# Patient Record
Sex: Female | Born: 1935 | Race: White | Hispanic: No | Smoking: Never smoker
Health system: Southern US, Community
[De-identification: ages and names within clinical notes are randomized; demographics above are authoritative.]

## PROBLEM LIST (undated history)

## (undated) DIAGNOSIS — C801 Malignant (primary) neoplasm, unspecified: Secondary | ICD-10-CM

## (undated) DIAGNOSIS — N186 End stage renal disease: Secondary | ICD-10-CM

## (undated) DIAGNOSIS — N19 Unspecified kidney failure: Secondary | ICD-10-CM

## (undated) DIAGNOSIS — I4891 Unspecified atrial fibrillation: Secondary | ICD-10-CM

## (undated) DIAGNOSIS — M1990 Unspecified osteoarthritis, unspecified site: Secondary | ICD-10-CM

## (undated) DIAGNOSIS — E785 Hyperlipidemia, unspecified: Secondary | ICD-10-CM

## (undated) DIAGNOSIS — I10 Essential (primary) hypertension: Secondary | ICD-10-CM

## (undated) DIAGNOSIS — I509 Heart failure, unspecified: Secondary | ICD-10-CM

## (undated) DIAGNOSIS — I341 Nonrheumatic mitral (valve) prolapse: Secondary | ICD-10-CM

## (undated) HISTORY — DX: Malignant (primary) neoplasm, unspecified: C80.1

## (undated) HISTORY — DX: Unspecified osteoarthritis, unspecified site: M19.90

## (undated) HISTORY — DX: Heart failure, unspecified: I50.9

## (undated) HISTORY — PX: ABDOMINAL HYSTERECTOMY: SHX81

## (undated) HISTORY — PX: NEPHRECTOMY: SHX65

## (undated) HISTORY — DX: Unspecified atrial fibrillation: I48.91

## (undated) HISTORY — PX: TONSILLECTOMY: SUR1361

## (undated) HISTORY — DX: Hyperlipidemia, unspecified: E78.5

## (undated) HISTORY — PX: APPENDECTOMY: SHX54

---

## 2008-02-16 HISTORY — PX: AV FISTULA PLACEMENT: SHX1204

## 2008-06-05 ENCOUNTER — Ambulatory Visit: Payer: Self-pay | Admitting: Cardiology

## 2008-07-17 ENCOUNTER — Ambulatory Visit: Payer: Self-pay | Admitting: Vascular Surgery

## 2008-07-25 ENCOUNTER — Ambulatory Visit (HOSPITAL_COMMUNITY): Admission: RE | Admit: 2008-07-25 | Discharge: 2008-07-25 | Payer: Self-pay | Admitting: Vascular Surgery

## 2008-07-29 ENCOUNTER — Ambulatory Visit (HOSPITAL_COMMUNITY): Admission: RE | Admit: 2008-07-29 | Discharge: 2008-07-29 | Payer: Self-pay | Admitting: Vascular Surgery

## 2008-07-29 ENCOUNTER — Ambulatory Visit: Payer: Self-pay | Admitting: Vascular Surgery

## 2008-08-28 ENCOUNTER — Ambulatory Visit: Payer: Self-pay | Admitting: Vascular Surgery

## 2009-02-15 HISTORY — PX: PERITONEAL CATHETER INSERTION: SHX2223

## 2009-03-19 ENCOUNTER — Ambulatory Visit: Payer: Self-pay | Admitting: Vascular Surgery

## 2009-08-01 ENCOUNTER — Ambulatory Visit: Payer: Self-pay | Admitting: Vascular Surgery

## 2009-08-19 ENCOUNTER — Ambulatory Visit (HOSPITAL_COMMUNITY): Admission: RE | Admit: 2009-08-19 | Discharge: 2009-08-19 | Payer: Self-pay | Admitting: Vascular Surgery

## 2009-08-21 ENCOUNTER — Ambulatory Visit: Payer: Self-pay | Admitting: Vascular Surgery

## 2009-08-21 ENCOUNTER — Ambulatory Visit (HOSPITAL_COMMUNITY): Admission: RE | Admit: 2009-08-21 | Discharge: 2009-08-21 | Payer: Self-pay | Admitting: Vascular Surgery

## 2009-09-02 ENCOUNTER — Ambulatory Visit (HOSPITAL_COMMUNITY): Admission: RE | Admit: 2009-09-02 | Discharge: 2009-09-02 | Payer: Self-pay | Admitting: Surgery

## 2009-09-24 ENCOUNTER — Ambulatory Visit: Payer: Self-pay | Admitting: Vascular Surgery

## 2009-10-11 ENCOUNTER — Inpatient Hospital Stay (HOSPITAL_COMMUNITY): Admission: EM | Admit: 2009-10-11 | Discharge: 2009-10-13 | Payer: Self-pay | Admitting: Internal Medicine

## 2009-11-06 ENCOUNTER — Ambulatory Visit: Payer: Self-pay | Admitting: Vascular Surgery

## 2010-04-30 ENCOUNTER — Ambulatory Visit (INDEPENDENT_AMBULATORY_CARE_PROVIDER_SITE_OTHER): Payer: Medicare Other | Admitting: Vascular Surgery

## 2010-04-30 DIAGNOSIS — N186 End stage renal disease: Secondary | ICD-10-CM

## 2010-05-01 LAB — BASIC METABOLIC PANEL
BUN: 40 mg/dL — ABNORMAL HIGH (ref 6–23)
CO2: 25 mEq/L (ref 19–32)
Calcium: 8.6 mg/dL (ref 8.4–10.5)
Chloride: 114 mEq/L — ABNORMAL HIGH (ref 96–112)
Creatinine, Ser: 3.74 mg/dL — ABNORMAL HIGH (ref 0.4–1.2)
GFR calc Af Amer: 14 mL/min — ABNORMAL LOW (ref >60)
GFR calc non Af Amer: 12 mL/min — ABNORMAL LOW (ref >60)
Glucose, Bld: 134 mg/dL — ABNORMAL HIGH (ref 70–99)
Potassium: 4 mEq/L (ref 3.5–5.1)
Sodium: 145 mEq/L (ref 135–145)

## 2010-05-01 LAB — PROTIME-INR
INR: 2.72 — ABNORMAL HIGH (ref 0.00–1.49)
INR: 2.85 — ABNORMAL HIGH (ref 0.00–1.49)
INR: 2.95 — ABNORMAL HIGH (ref 0.00–1.49)
Prothrombin Time: 28.9 seconds — ABNORMAL HIGH (ref 11.6–15.2)
Prothrombin Time: 30.8 seconds — ABNORMAL HIGH (ref 11.6–15.2)

## 2010-05-01 LAB — CBC
HCT: 29.5 % — ABNORMAL LOW (ref 36.0–46.0)
Hemoglobin: 9.7 g/dL — ABNORMAL LOW (ref 12.0–15.0)
MCHC: 32.9 g/dL (ref 30.0–36.0)
MCV: 86.3 fL (ref 78.0–100.0)
Platelets: 162 10*3/uL (ref 150–400)
RDW: 15.6 % — ABNORMAL HIGH (ref 11.5–15.5)
WBC: 7.9 10*3/uL (ref 4.0–10.5)

## 2010-05-01 LAB — RENAL FUNCTION PANEL
Albumin: 2.8 g/dL — ABNORMAL LOW (ref 3.5–5.2)
BUN: 29 mg/dL — ABNORMAL HIGH (ref 6–23)
CO2: 22 mEq/L (ref 19–32)
Calcium: 8.2 mg/dL — ABNORMAL LOW (ref 8.4–10.5)
Chloride: 114 mEq/L — ABNORMAL HIGH (ref 96–112)
Creatinine, Ser: 3.43 mg/dL — ABNORMAL HIGH (ref 0.4–1.2)
GFR calc Af Amer: 16 mL/min — ABNORMAL LOW (ref >60)
GFR calc non Af Amer: 13 mL/min — ABNORMAL LOW (ref >60)
Glucose, Bld: 147 mg/dL — ABNORMAL HIGH (ref 70–99)
Phosphorus: 2.5 mg/dL (ref 2.3–4.6)
Potassium: 3.7 mEq/L (ref 3.5–5.1)
Sodium: 144 mEq/L (ref 135–145)

## 2010-05-01 LAB — CARDIAC PANEL(CRET KIN+CKTOT+MB+TROPI)
CK, MB: 1 ng/mL (ref 0.3–4.0)
CK, MB: 1.4 ng/mL (ref 0.3–4.0)
Relative Index: INVALID (ref 0.0–2.5)
Total CK: 35 U/L (ref 7–177)
Troponin I: 0.03 ng/mL (ref 0.00–0.06)
Troponin I: 0.03 ng/mL (ref 0.00–0.06)

## 2010-05-01 LAB — APTT: aPTT: 43 seconds — ABNORMAL HIGH (ref 24–37)

## 2010-05-01 LAB — GLUCOSE, CAPILLARY: Glucose-Capillary: 133 mg/dL — ABNORMAL HIGH (ref 70–99)

## 2010-05-01 LAB — MAGNESIUM: Magnesium: 2.2 mg/dL (ref 1.5–2.5)

## 2010-05-01 LAB — PHOSPHORUS: Phosphorus: 3.1 mg/dL (ref 2.3–4.6)

## 2010-05-01 NOTE — Assessment & Plan Note (Signed)
OFFICE VISIT  Valerie Yoder, Valerie Yoder DOB:  May 31, 1930                                       04/30/2010 CHART#:[id number]  The patient returns today for followup appointment.  She previously had a left brachiocephalic fistula placed in June of 2010.  She subsequently had a patch angioplasty of the fistula performed in July of 2011 for a narrowing in the fistula.  She currently is on peritoneal dialysis.  She returns today for further followup and evaluation of the fistula.  CHRONIC MEDICAL PROBLEMS:  Continue to remain diabetes, hypertension, congestive heart failure.  REVIEW OF SYSTEMS:  She denies any shortness of breath or chest pain and states that her peritoneal dialysis has been going well.  PHYSICAL EXAM:  Blood pressure is 151/66 in the left arm, heart rate 66 and regular.  Temperature is 98.1.  Left upper extremity:  She has an easily palpable thrill in the fistula.  The proximal aspect of the fistula has dilated well and is approximately 6 mm in diameter.  It is between 4 and 6 mm in diameter on palpation in the upper arm.  Since the patient currently is not on dialysis I would prefer to continue to observe this for a while longer and see if it will mature on its own history.  She will continue to exercise the fistula.  She will follow up in 6 weeks' time for a duplex ultrasound of the fistula.  If it has not matured at that point then we will consider whether other interventions might be necessary.    Janetta Hora. Tajana Crotteau, MD Electronically Signed  CEF/MEDQ  D:  04/30/2010  T:  05/01/2010  Job:  [id number]  cc:   Duke Salvia. Eliott Nine, M.D.

## 2010-05-02 LAB — URINALYSIS, ROUTINE W REFLEX MICROSCOPIC
Glucose, UA: NEGATIVE mg/dL
Nitrite: NEGATIVE
pH: 6 (ref 5.0–8.0)

## 2010-05-02 LAB — URINE MICROSCOPIC-ADD ON

## 2010-05-02 LAB — POCT I-STAT 4, (NA,K, GLUC, HGB,HCT): Glucose, Bld: 117 mg/dL — ABNORMAL HIGH (ref 70–99)

## 2010-05-02 LAB — PROTIME-INR: Prothrombin Time: 16.2 seconds — ABNORMAL HIGH (ref 11.6–15.2)

## 2010-05-02 LAB — GLUCOSE, CAPILLARY: Glucose-Capillary: 117 mg/dL — ABNORMAL HIGH (ref 70–99)

## 2010-05-03 LAB — CBC
Hemoglobin: 11.2 g/dL — ABNORMAL LOW (ref 12.0–15.0)
Platelets: 181 10*3/uL (ref 150–400)
RBC: 3.73 MIL/uL — ABNORMAL LOW (ref 3.87–5.11)
WBC: 7.9 10*3/uL (ref 4.0–10.5)

## 2010-05-03 LAB — SURGICAL PCR SCREEN: Staphylococcus aureus: NEGATIVE

## 2010-05-03 LAB — PROTIME-INR
INR: 1.66 — ABNORMAL HIGH (ref 0.00–1.49)
Prothrombin Time: 19.5 seconds — ABNORMAL HIGH (ref 11.6–15.2)
Prothrombin Time: 22.3 seconds — ABNORMAL HIGH (ref 11.6–15.2)

## 2010-05-03 LAB — POCT I-STAT 4, (NA,K, GLUC, HGB,HCT)
Hemoglobin: 10.9 g/dL — ABNORMAL LOW (ref 12.0–15.0)
Hemoglobin: 12.2 g/dL (ref 12.0–15.0)
Potassium: 3.8 mEq/L (ref 3.5–5.1)
Potassium: 4.2 mEq/L (ref 3.5–5.1)
Sodium: 139 mEq/L (ref 135–145)

## 2010-05-03 LAB — BASIC METABOLIC PANEL
Calcium: 8.6 mg/dL (ref 8.4–10.5)
Chloride: 103 mEq/L (ref 96–112)
Creatinine, Ser: 5.02 mg/dL — ABNORMAL HIGH (ref 0.4–1.2)
GFR calc Af Amer: 10 mL/min — ABNORMAL LOW (ref >60)
Sodium: 137 mEq/L (ref 135–145)

## 2010-05-03 LAB — GLUCOSE, CAPILLARY: Glucose-Capillary: 116 mg/dL — ABNORMAL HIGH (ref 70–99)

## 2010-05-25 LAB — PROTIME-INR
INR: 1.8 — ABNORMAL HIGH (ref 0.00–1.49)
INR: 3.3 — ABNORMAL HIGH (ref 0.00–1.49)
Prothrombin Time: 21.5 seconds — ABNORMAL HIGH (ref 11.6–15.2)
Prothrombin Time: 36.2 seconds — ABNORMAL HIGH (ref 11.6–15.2)

## 2010-05-25 LAB — APTT
aPTT: 28 seconds (ref 24–37)
aPTT: 37 seconds (ref 24–37)

## 2010-05-25 LAB — POCT I-STAT 4, (NA,K, GLUC, HGB,HCT)
Potassium: 4.4 mEq/L (ref 3.5–5.1)
Sodium: 138 mEq/L (ref 135–145)

## 2010-06-11 ENCOUNTER — Encounter (INDEPENDENT_AMBULATORY_CARE_PROVIDER_SITE_OTHER): Payer: Medicare Other

## 2010-06-11 ENCOUNTER — Ambulatory Visit (INDEPENDENT_AMBULATORY_CARE_PROVIDER_SITE_OTHER): Payer: Medicare Other | Admitting: Vascular Surgery

## 2010-06-11 DIAGNOSIS — T82898A Other specified complication of vascular prosthetic devices, implants and grafts, initial encounter: Secondary | ICD-10-CM

## 2010-06-11 DIAGNOSIS — N186 End stage renal disease: Secondary | ICD-10-CM

## 2010-06-12 NOTE — Assessment & Plan Note (Signed)
OFFICE VISIT  Valerie, Yoder DOB:  Dec 27, 1930                                       06/11/2010 CHART#:[id number]  The patient returns for followup today.  She recently had a left brachiocephalic AV fistula placed in June of 2010.  She subsequently had patch angioplasty of this fistula in July of 2011.  Currently she is on peritoneal dialysis.  She returns today for further followup to make sure that the fistula has continued to mature.  PHYSICAL EXAM:  Today blood pressure is 124/64 in the right arm, heart rate 66 and regular.  She has an easily palpable thrill in the upper arm fistula.  She had a duplex ultrasound today which showed no evidence of narrowing in the fistula.  The fistula diameter is between 60 mm and 80 mm.  It is slightly deeper in the upper arm and is of good depth in the proximal arm.  Currently since she is on peritoneal dialysis I do not believe any further interventions are needed on her fistula.  However, the fistula should be usable if she requires it at some point in the future.  She will follow up with me on an as-needed basis.    Janetta Hora. Valerie Flammia, MD Electronically Signed  CEF/MEDQ  D:  06/11/2010  T:  06/12/2010  Job:  [id number]  cc:   [facility] Salvia. Eliott Nine, M.D.

## 2010-06-17 NOTE — Procedures (Unsigned)
VASCULAR LAB EXAM   INDICATIONS:  A 6-week follow-up of left arm AV fistula.  HISTORY:  EXAM:  Left AV fistula duplex.  IMPRESSION:  1. Patent left brachial to cephalic AV fistula with a velocity of 806     cm/s noted at the outflow vein of the antecubital fossa level and a     velocity of 427 cm/s at the mid brachium level of the outflow vein.     There appears to be a patent branch at the mid brachium level     outflow vein measuring 0.54 cm. 2. Additional velocity, depth, and diameter measurements are noted on     the attached worksheet. 3. No significant change noted when compared to the previous exam on     11/06/2009.      ___________________________________________ Janetta Hora. Fields, MD  CH/MEDQ  D:  06/11/2010  T:  06/12/2010  Job:  [id number]

## 2010-06-30 NOTE — Assessment & Plan Note (Signed)
OFFICE VISIT   KEAIRRA, BARDON  DOB:  [birth year]                                       08/01/2009  CHART#:[id number]   REASON FOR VISIT:  Now maturing left upper arm AV fistula.   Patient is a 77 year old Caucasian female who stage IV chronic kidney  disease with a solitary kidney.  When she last saw Dr. Camille Bal in  May of this year, her creatinine was up to 3.2.  Apparently she is  interested in peritoneal dialysis and saw Dr. Michaell Cowing last week in regards  to getting a PD catheter placed.  From the patient's report, it does not  sound Dr. Michaell Cowing has quite yet decided if she is a good candidate, as she  has had a hysterectomy and surgery for abdominal adhesions in the past.  She did have a left brachiocephalic arteriovenous fistula placed by Dr.  Fabienne Bruns on 07/29/2008.  He saw her last on 03/19/2009, and at  that time he commented that it was somewhat tortuous but felt that it  was dilating reasonably.  He felt that unless she was in imminent need  for dialysis that he would not do any further interventions.  He was to  see her in 6 months.  Since she is now nearing need for hemodialysis,  Dr. Eliott Nine asked [place] to evaluate her fistula, as during her last visit  she felt that it was extremely difficult to palpate.  She continues to deny any steal symptoms such as hand numbness, weakness  or pain.   Past medical history is significant for diabetes mellitus type 2,  hypertension, hypercholesteremia, congestive heart failure, atrial  fibrillation on Coumadin with history of cardioversion.  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   Her review of systems is positive for a weight loss of approximately 20  pounds since starting the peritoneal diet.  She is now down to 110  pounds.  She also reports a history of  atrial fibrillation and chronic  kidney disease with frequent urination and a history of nosebleeds,  otherwise her review of systems was negative per our [facility] medical history  form.   Patient did undergo an ultrasound of her left upper arm fistula.  It was  noted to be patent with a narrow flow channel in the cephalic vein near  than anastomosis with elevated velocities of 652 cm/s.  By the  technician's measurements, the fistula was 0.39 cm at the anastomosis,  but it then measured 0.66 to 0.85 cm up the arm.   Physical exam findings show a heart rate of 63, blood pressure 175/64 in  the right arm and respirations 18.  General exam shows a well-developed,  well-nourished female who is pleasant in no acute distress.  Head is  normocephalic.  Lung sounds are clear.  Heart has a fairly regular  rhythm.  She does have a 2/6 systolic ejection murmur.  Her neuro exam  is nonfocal.  Her extremities show no edema.  She has a 2+ radial pulse  on the left.  There is no upper arm AV edema.  Her left upper arm AV  fistula is easily palpable just above the antecubital space but is not  as palpable in the  upper arm, although a thrill could be felt.   Patient presents with a nonmaturing AV fistula.  By ultrasound it  appears that she does have stenosis of her cephalic vein just beyond the  anastomosis.  It appears it is a relatively focal area of stenosis.  I  did discuss with Dr. Arbie Cookey possible treatment options.  At this time, we  will plan to take her to the operating room in the near future for  revision of her left AV fistula in hopes to repair the stenosis.  We  would not consider angioplasty in the area, as we would not want to risk  giving her dye, as she is not yet on hemodialysis.  Our office will  contact patient early next week to schedule the operative date, as she  will need to be off Coumadin for several days prior to surgery.   Jerold Coombe, PA   Larina Earthly, M.D.   Electronically Signed   AWZ/MEDQ  D:  08/01/2009  T:  08/01/2009  Job:  [id number]   cc:   [facility] Salvia. Eliott Nine, M.D.

## 2010-06-30 NOTE — Assessment & Plan Note (Signed)
OFFICE VISIT   Valerie Yoder, Valerie Yoder  DOB:  May 20, 1930                                       09/24/2009  CHART#:[id number]   Valerie Yoder returns for follow-up today.  She underwent revision of the  proximal aspect of her left arm AV fistula on July 7.  She returns today  for further follow-up.  She states that recently she saw Dr. Eliott Nine and  they were pleased that the fistula seemed to be maturing.  The patient  has also noted that the fistula is more prominent on her skin.  She  denies any steal symptoms in the left hand.   PHYSICAL EXAMINATION:  Today blood pressure is 143/62 in the right arm,  heart rate 62 and regular, respirations 16.  Left upper extremity shows an easily palpable left brachiocephalic AV  fistula.  There is an easily palpable thrill in this and an audible  bruit.  The proximal aspect of the fistula is more dilated than the  distal aspect but overall the fistula is much better than it was on the  previous evaluation.   I believe the best option at this point is to continue to have her  exercise the fistula and hopefully it will continue to develop over the  next month.  She was seen me in 1 month's time with a duplex of her  fistula to make sure that this is overall developing and maturing well.  This will be in 1 month.     Janetta Hora. Fields, MD  Electronically Signed   CEF/MEDQ  D:  09/24/2009  T:  09/25/2009  Job:  [id number]   cc:   [facility] Salvia. Eliott Nine, M.D.

## 2010-06-30 NOTE — Assessment & Plan Note (Signed)
OFFICE VISIT   Valerie Yoder, Valerie Yoder  DOB:  05/24/1930                                       11/06/2009  CHART#:[id number]   The patient returns for followup today.  She originally had a left  brachiocephalic AV fistula placed in June 2011.  This was revised with a  proximal patch in July 2011.  She returns today for further followup.  She is currently not on dialysis and is most likely going to start  peritoneal dialysis prior to hemodialysis.  She denies any symptoms of  steal in her hand.   REVIEW OF SYSTEMS:  She denies any shortness of breath.   PHYSICAL EXAMINATION:  VITAL SIGNS:  Blood pressure 125/61 in the right  arm, heart rate is 66 and regular, temperature is 97.4.  She has an  easily palpable thrill in the left upper arm AV fistula.  This seems to  have developed somewhat since her recent patch angioplasty.  She had a  duplex ultrasound of her fistula today which shows that the fistula  diameter is between 6.5 and 7.5 mm in diameter.  It is between 7 mm in  depth at the proximal aspect to as superficial as 1 mm from the skin  depth at the more distal portion.  There was one side branch in the  midportion of the fistula and there was no obvious stenosis throughout  the fistula.   At this point, since she is currently not on hemodialysis and is going  to try peritoneal dialysis first, I believe we should let this fistula  continue to mature over time and if she requires it at some point in the  future, it should be ready for use at that point.  She will follow up in  3 months' time.     Valerie Hora. Fields, MD  Electronically Signed   CEF/MEDQ  D:  11/06/2009  T:  11/07/2009  Job:  [id number]   cc:   [facility] Salvia. Eliott Nine, M.D.

## 2010-06-30 NOTE — Procedures (Signed)
VASCULAR LAB EXAM   INDICATION:  Follow-up left AV fistula intervention.    HISTORY:   EXAM:  Left arm AV fistula duplex.   IMPRESSION:  1. Patent left brachial-to-cephalic arteriovenous fistula with Doppler      velocity of 671 cm/s noted in the antecubital fossa level outflow      vein.  This increase in velocity appears to be due to a change in      vessel diameter versus any obvious internal obstruction.  2. There is a velocity of 459 cm/s noted at the left mid/distal      brachium level outflow vein near a 0.32 cm patent branch.  No      obvious stenosis or vessel narrowing is noted in this region.  3. Diameter and depth measurements of the left outflow vein noted on      the attached worksheet.   ___________________________________________  Janetta Hora. Fields, MD   CH/MEDQ  D:  11/06/2009  T:  11/06/2009  Job:  [id number]

## 2010-06-30 NOTE — Assessment & Plan Note (Signed)
OFFICE VISIT   Valerie Yoder, Valerie Yoder  DOB:  April 30, 1930                                       07/17/2008  CHART#:[id number]   The patient is a 77 year old female referred by Dr. Eliott Nine for  consideration of placement of a long-term hemodialysis access.  She is  right-handed.  She currently is not on dialysis.  She has stage IV  kidney disease.  She has previously had a right nephrectomy.  Her renal  failure is thought to be secondary to glomerulosclerosis   PAST SURGICAL HISTORY:  Also remarkable for hysterectomy.   PAST MEDICAL HISTORY:  Otherwise remarkable for diabetes, hypertension,  elevated cholesterol, congestive heart failure and atrial fibrillation.   MEDICATIONS:  Darvocet 650 mg p.r.n., furosemide 40 mg half-tablet once  a day, warfarin 2.5 mg half-tablet daily for atrial fibrillation,  clonidine 0.1 mg twice a day, amiodarone 200 once a day, folate 800 mg  once a day, lovastatin 20 mg once a day, estradiol 1 mg once a day,  Tekturna 300 mg half-tablet once a day, spirolactone 25 mg half-tablet  once a day.   ALLERGIES:  She has an allergy to sulfa, which causes nausea;  penicillin, a rash; Pneumovax, arm swelling.   FAMILY HISTORY:  Unremarkable.   SOCIAL HISTORY:  She is married, has 2 children.  Nonsmoker, non-  consumer of alcohol.   REVIEW OF SYSTEMS:  She is 5 feet 3 inches, 138 pounds.  ORTHOPEDIC:  She has multiple-joint arthritis pain.   PHYSICAL EXAMINATION:  Exam blood pressure is 174/75 in the left arm,  pulse is 48 and regular.  Upper extremities:  She has 2+ brachial and  radial pulses bilaterally.  Cephalic vein is palpable near the  antecubital region.  Her upper arm is slightly obese.  Cephalic vein is  not palpable in the lower arm.   She had a vein mapping ultrasound today which shows the cephalic vein in  the right arm is totally usable for placement of a fistula.  The left  arm has a marginal vein but is between 28 and  33 mm in diameter in the  upper arm.   The patient stated today that she is considering peritoneal dialysis as  well as hemodialysis.  Since she is currently not on dialysis, I believe  the best option for her would be placement of a left brachiocephalic AV  fistula.  The vein is a little bit on the small side but, hopefully, we  will have some time that this can mature and that she could use this in  the long term as a more durable access.  Risks, benefits, possible  complications and procedure details of fistula placement were described  to the patient and her husband today.  They understand and agree to  proceed.  We will schedule her procedure within the next couple of  weeks.  We will stop her Coumadin 3 days prior to her procedure.   Janetta Hora. Fields, MD  Electronically Signed   CEF/MEDQ  D:  07/17/2008  T:  07/18/2008  Job:  [id number]   cc:   [facility] Salvia. Eliott Nine, M.D.  Nadine Counts

## 2010-06-30 NOTE — Procedures (Signed)
VASCULAR LAB EXAM   INDICATION:  Nonmaturing left AVF.   HISTORY:  Diabetes:  Yes.  Cardiac:  A fib.  Hypertension:  Yes.   EXAM:  Left AVF duplex.   IMPRESSION:  1. Patent left arteriovenous fistula with narrow flow channel in      cephalic vein near the anastomosis with elevated velocity of 652      cm/s.  2. See attached drawing for all measurements.         ___________________________________________  Larina Earthly, M.D.   AS/MEDQ  D:  08/01/2009  T:  08/01/2009  Job:  [id number]

## 2010-06-30 NOTE — Assessment & Plan Note (Signed)
OFFICE VISIT   Valerie Yoder, Valerie Yoder  DOB:  1930-04-26                                       03/19/2009  CHART#:[id number]   The patient returns for followup today.  She previously had a left  brachiocephalic AV fistula placed in June of 2010.  She currently is not  on dialysis.  She states that on her most recent visit she has now  actually only stage II renal dysfunction.   PHYSICAL EXAM:  Today heart rate was 54 and regular.  Blood pressure was  222/79, respirations 16.  Left upper extremity has an easily palpable  thrill in the fistula.  It is of reasonable diameter proximally and  somewhat tortuous but overall the fistula does not have any significant  obvious narrowing.   I discussed with the patient today that if she required dialysis  immediately the fistula would probably not be ready for use but  hopefully this will continue to mature over time.  Since she currently  has stage II renal dysfunction I am not inclined to do any other further  interventions on her fistula at this point except for continued exercise  to see if it will mature over time.  She will follow up with me in six  months' time.  If her kidneys continue to deteriorate we may need to  consider further investigative procedures on her fistula in the short  term.  Otherwise I will see her 6 months.     Janetta Hora. Fields, MD  Electronically Signed   CEF/MEDQ  D:  03/19/2009  T:  03/20/2009  Job:  [id number]   cc:   [facility] Salvia. Eliott Nine, M.D.

## 2010-06-30 NOTE — Procedures (Signed)
CEPHALIC VEIN MAPPING   INDICATION:  Preop AVF, vein map.   HISTORY:  Kidney disease.   EXAM:   The right cephalic vein is compressible.   Diameter measurements range from 0.13 cm to 0.19 cm.   The left cephalic vein is compressible.   Diameter measurements range from 0.17 cm to 0.33 cm, however, 0.28 cm to  0.33 cm in the brachium.   See attached worksheet for all measurements.   IMPRESSION:  Patent bilateral cephalic veins which are of questionable  diameter for use as a dialysis access site in the left brachium.  The  left forearm and right upper extremity cephalic veins appear too small.   ___________________________________________  Janetta Hora. Fields, MD   AS/MEDQ  D:  07/17/2008  T:  07/17/2008  Job:  [id number]

## 2010-06-30 NOTE — Assessment & Plan Note (Signed)
OFFICE VISIT   Valerie Yoder, Valerie Yoder  DOB:  May 26, 1930                                       08/28/2008  CHART#:[id number]   The patient returns for followup today.  She had a left brachiocephalic  AV fistula placed on June 14.  She is currently not on dialysis.   PHYSICAL EXAM:  Today blood pressure is 138/73 in the right arm, pulse  is 55 and regular.  The fistula has a palpable thrill and easily audible  bruit.  She has no numbness, tingling or coolness in the left and no  evidence of steal.  However, the fistula is fairly small in appearance.   Since she is currently not on dialysis I believe the best option is to  continue to exercise the fistula.  If she requires dialysis in the near  future we may need to consider placing a graft or consider a fistulogram  to see if there is anything we can do to improve the fistula in general.  The vein was approximately 3 mm in diameter throughout its course in the  left upper arm preoperatively.   I explained all this to the patient today.  She will follow up with me  in three months' time.  If the fistula is still small at that time we  may need to consider placing a new access if she is on dialysis at that  point.   Janetta Hora. Fields, MD  Electronically Signed   CEF/MEDQ  D:  08/28/2008  T:  08/29/2008  Job:  [id number]   cc:   [facility] Salvia. Eliott Nine, M.D.

## 2010-06-30 NOTE — Op Note (Signed)
NAMEKATHERLEEN, Valerie Yoder             ACCOUNT NO.:  [id number]   MEDICAL RECORD NO.:  [id number]          PATIENT TYPE:  AMB   LOCATION:  SDS                          FACILITY:  [facility]   PHYSICIAN:  Janetta Hora. Fields, MD  DATE OF BIRTH:  December 02, 1930   DATE OF PROCEDURE:  07/29/2008  DATE OF DISCHARGE:  07/29/2008                               OPERATIVE REPORT   SURGEON:  Janetta Hora. Fields, MD   PROCEDURE:  Left brachiocephalic arteriovenous fistula.   PREOPERATIVE DIAGNOSIS:  End-stage renal disease.   POSTOPERATIVE DIAGNOSIS:  End-stage renal disease.   ANESTHESIA:  Local with IV sedation.   ASSISTANT:  Jerold Coombe, PA-C   OPERATIVE FINDINGS:  A 3- to 3.5-mm left cephalic vein.   OPERATIVE DETAILS:  After obtaining informed consent, the patient was  taken to the operating room.  The patient was placed in supine position  on the operating table.  After adequate sedation, the patient's entire  left upper extremity was prepped and draped in the usual sterile  fashion.  Local anesthesia was infiltrated near the antecubital crease.  A transverse incision was made in this location, carried down through  the subcutaneous tissues down to the level of cephalic vein.  Cephalic  vein was of good quality approximately 3 mm in diameter.  Small side  branches were ligated and divided between silk ties.  Next, the brachial  artery was dissected free in the medial portion of the incision.  Vessel  loops were placed proximally and distally at the planned site of  arteriotomy.  The patient was then given 5000 units of intravenous  heparin.  The distal cephalic vein was ligated with 2-0 silk tie and the  vein was transected, gently distended with heparinized saline and  flushed.  This was then swung over to the level of the brachial artery.  A longitudinal opening was made in the brachial artery and the vein was  sewn end of vein to side of artery using a running 7-0 Prolene suture.  Just  prior to completion of the anastomosis, this was forward bled,  backbled, and thoroughly flushed.  Anastomosis was secured, clamps were  released.  There was palpable thrill in the fistula immediately.  The  patient still had a palpable radial pulse.  Hemostasis was obtained.  Subcutaneous tissues were reapproximated using running 3-0 Vicryl  suture.  Skin was closed with 4-0 Vicryl subcuticular stitch.  Dermabond  was applied.  The skin was well dressed with sterile dressing.  The  patient tolerated the procedure well, and there were no complications.  Instrument, sponge, and needle count was correct at the end of the case.  The patient was taken to the recovery room in stable condition.      Janetta Hora. Fields, MD  Electronically Signed    CEF/MEDQ  D:  07/29/2008  T:  07/29/2008  Job:  [id number]

## 2011-02-01 ENCOUNTER — Emergency Department (HOSPITAL_COMMUNITY)
Admission: EM | Admit: 2011-02-01 | Discharge: 2011-02-02 | Disposition: A | Discharge status: Home / Self Care | Payer: Medicare Other | Attending: Emergency Medicine | Admitting: Emergency Medicine

## 2011-02-01 ENCOUNTER — Encounter: Payer: Self-pay | Admitting: Emergency Medicine

## 2011-02-01 DIAGNOSIS — R5383 Other fatigue: Secondary | ICD-10-CM | POA: Insufficient documentation

## 2011-02-01 DIAGNOSIS — E119 Type 2 diabetes mellitus without complications: Secondary | ICD-10-CM | POA: Insufficient documentation

## 2011-02-01 DIAGNOSIS — T8571XA Infection and inflammatory reaction due to peritoneal dialysis catheter, initial encounter: Secondary | ICD-10-CM

## 2011-02-01 DIAGNOSIS — Z7901 Long term (current) use of anticoagulants: Secondary | ICD-10-CM | POA: Insufficient documentation

## 2011-02-01 DIAGNOSIS — Y841 Kidney dialysis as the cause of abnormal reaction of the patient, or of later complication, without mention of misadventure at the time of the procedure: Secondary | ICD-10-CM | POA: Insufficient documentation

## 2011-02-01 DIAGNOSIS — R5381 Other malaise: Secondary | ICD-10-CM | POA: Insufficient documentation

## 2011-02-01 DIAGNOSIS — Z79899 Other long term (current) drug therapy: Secondary | ICD-10-CM | POA: Insufficient documentation

## 2011-02-01 DIAGNOSIS — R509 Fever, unspecified: Secondary | ICD-10-CM | POA: Insufficient documentation

## 2011-02-01 DIAGNOSIS — I10 Essential (primary) hypertension: Secondary | ICD-10-CM | POA: Insufficient documentation

## 2011-02-01 HISTORY — DX: Nonrheumatic mitral (valve) prolapse: I34.1

## 2011-02-01 HISTORY — DX: Essential (primary) hypertension: I10

## 2011-02-01 HISTORY — DX: Unspecified kidney failure: N19

## 2011-02-01 LAB — CBC
HCT: 30.6 % — ABNORMAL LOW (ref 36.0–46.0)
Hemoglobin: 10.6 g/dL — ABNORMAL LOW (ref 12.0–15.0)
MCH: 30.3 pg (ref 26.0–34.0)
MCHC: 34.6 g/dL (ref 30.0–36.0)
MCV: 87.4 fL (ref 78.0–100.0)

## 2011-02-01 LAB — DIFFERENTIAL
Basophils Relative: 0 % (ref 0–1)
Eosinophils Relative: 0 % (ref 0–5)
Monocytes Absolute: 0.9 10*3/uL (ref 0.1–1.0)
Monocytes Relative: 5 % (ref 3–12)
Neutro Abs: 14.8 10*3/uL — ABNORMAL HIGH (ref 1.7–7.7)

## 2011-02-01 LAB — BASIC METABOLIC PANEL
BUN: 35 mg/dL — ABNORMAL HIGH (ref 6–23)
Calcium: 8.7 mg/dL (ref 8.4–10.5)
Chloride: 87 mEq/L — ABNORMAL LOW (ref 96–112)
Creatinine, Ser: 6.61 mg/dL — ABNORMAL HIGH (ref 0.50–1.10)
GFR calc Af Amer: 6 mL/min — ABNORMAL LOW (ref >90)

## 2011-02-01 MED ORDER — CEFTAZIDIME 200 MG/ML FOR DIALYSIS
1000.0000 mg | INJECTION | Freq: Once | INTRAVENOUS_CENTRAL | Status: DC
  Filled 2011-02-01: qty 5

## 2011-02-01 MED ORDER — VANCOMYCIN HCL IN DEXTROSE 1-5 GM/200ML-% IV SOLN
INTRAVENOUS | Status: AC
  Filled 2011-02-01: qty 200

## 2011-02-01 MED ORDER — VANCOMYCIN 100 MG/ML FOR DIALYSIS
1000.0000 mg | INJECTION | Freq: Once | Status: DC
  Filled 2011-02-01: qty 10

## 2011-02-01 NOTE — ED Notes (Signed)
Dialysis rn here to administer the antiobiotic in the dialysis fluid

## 2011-02-01 NOTE — ED Notes (Signed)
Explanation given to the pt and daughter for the delay. Dialysis rn tied up

## 2011-02-01 NOTE — ED Notes (Signed)
Dialysis procedure finishing up

## 2011-02-01 NOTE — ED Notes (Signed)
PT. REPORTS GENERALIZED WEAKNESS WITH FEVER THIS MORNING , NOTICE CLOUDY SOLUTION AT PERITONEAL DIALYSIS TUBE TODAY WITH LEFT FLANK PAIN .

## 2011-02-01 NOTE — ED Notes (Signed)
The pts family is upset that the peritoneal fluid has not been drained from her peritoneal cavity

## 2011-02-01 NOTE — ED Notes (Signed)
Dr Judd Lien notified of critical K of 2.6

## 2011-02-01 NOTE — ED Notes (Signed)
RECEIVED A CALL FROM [phone] NURSE REQUESTING TO BE CALLED AS SOON AS PT. HAS A PRIVATE ROOM AT ER FOR ASSESSMENT - POSSIBLE PERITONEAL DIALYSIS.

## 2011-02-01 NOTE — ED Notes (Signed)
The pt had cloudy peritoneal fluid earlier today.  She was sent here for rapid exchanges and antibiotics to be instilled into her peritoneal fluid.  The pt is alert and she has had abd pain today.  Skin warm and dry no distress

## 2011-02-01 NOTE — ED Provider Notes (Signed)
History     CSN: [id number] Arrival date & time: 02/01/2011  7:22 PM   First MD Initiated Contact with Patient 02/01/11 2032      No chief complaint on file.   (Consider location/radiation/quality/duration/timing/severity/associated sxs/prior treatment) HPI Comments: Patient on home peritoneal dialysis.  Presents complaining of not feeling well since this morning.  This evening, had fever and pain in the left side.  Called Dr. Caryn Section and told to come here.  Dr. Caryn Section called me and asked me to expedite the patient's care.  She was then brought to a room immediately.    Patient is a 77 y.o. female presenting with fever. The history is provided by the patient.  Fever Primary symptoms of the febrile illness include fever and fatigue. The current episode started today. This is a new problem. The problem has been gradually worsening.    Past Medical History  Diagnosis Date  . Hypertension   . Diabetes mellitus   . Renal failure   . Mitral valve prolapse     Past Surgical History  Procedure Date  . Nephrectomy   . Abdominal hysterectomy   . Appendectomy   . Tonsillectomy     No family history on file.  History  Substance Use Topics  . Smoking status: Never Smoker   . Smokeless tobacco: Not on file  . Alcohol Use: No    OB History    Grav Para Term Preterm Abortions TAB SAB Ect Mult Living                  Review of Systems  Constitutional: Positive for fever and fatigue.  All other systems reviewed and are negative.    Allergies  Latex; Other; Penicillins; and Sulfa antibiotics  Home Medications   Current Outpatient Rx  Name Route Sig Dispense Refill  . AMIODARONE HCL 200 MG PO TABS Oral Take 200 mg by mouth daily.      Marland Kitchen CALCITRIOL 0.25 MCG PO CAPS Oral Take 0.25 mcg by mouth daily.      Marland Kitchen CALCIUM CARBONATE ANTACID 1000 MG PO CHEW Oral Chew 1,000 mg by mouth 3 (three) times daily.      Marland Kitchen ESTRADIOL 1 MG PO TABS Oral Take 1 mg by mouth daily.      Marland Kitchen FOLIC ACID  800 MCG PO TABS Oral Take 800 mcg by mouth daily.      Marland Kitchen LACTULOSE 10 GM/15ML PO SOLN Oral Take 20 g by mouth every 2 (two) hours as needed. Until diarrhea     . LEVOTHYROXINE SODIUM 50 MCG PO TABS Oral Take 50 mcg by mouth daily.      Marland Kitchen LOVASTATIN 20 MG PO TABS Oral Take 20 mg by mouth at bedtime.      Marland Kitchen METOCLOPRAMIDE HCL 5 MG PO TABS Oral Take 5 mg by mouth 4 (four) times daily.      Marland Kitchen POTASSIUM CHLORIDE 10 MEQ PO TBCR Oral Take 20 mEq by mouth daily.      Marland Kitchen PROMETHAZINE HCL 25 MG PO TABS Oral Take 6.25-12.5 mg by mouth every 6 (six) hours as needed. For nausea/vomiting     . WARFARIN SODIUM 1 MG PO TABS Oral Take 1 mg by mouth daily. Alternates between a 1mg  and 2mg  tablets     . WARFARIN SODIUM 2 MG PO TABS Oral Take 2 mg by mouth daily. Alternates between 1mg  and 2mg  tablets       BP 91/63  Pulse 88  Temp(Src) 99.5 F (  37.5 C) (Oral)  Resp 17  SpO2 96%  Physical Exam  Nursing note and vitals reviewed. Constitutional: She is oriented to person, place, and time. She appears well-developed and well-nourished. No distress.  HENT:  Head: Normocephalic and atraumatic.  Neck: Normal range of motion. Neck supple.  Cardiovascular: Normal rate and regular rhythm.  Exam reveals no gallop and no friction rub.   No murmur heard. Pulmonary/Chest: Effort normal and breath sounds normal. No respiratory distress. She has no wheezes.  Abdominal: Soft. Bowel sounds are normal. She exhibits no distension. There is no tenderness.  Musculoskeletal: Normal range of motion.  Neurological: She is alert and oriented to person, place, and time.  Skin: Skin is warm and dry. She is not diaphoretic.       The dialysis catheter site looks well.  No redness.      ED Course  Procedures (including critical care time)   Labs Reviewed  CBC  DIFFERENTIAL  BASIC METABOLIC PANEL  BODY FLUID CELL COUNT WITH DIFFERENTIAL  BODY FLUID CULTURE   No results found.   No diagnosis found.    MDM  The  nephrology nurse was consulted to perform peritoneal dialysis and culture the peritoneal fluid.  It did have a cloudy appearance and intraperitoneal antibiotics were given.  Once this procedure is completed, we will discharge her with follow up with Nephrology.          Geoffery Lyons, MD 02/01/11 [phone]

## 2011-02-02 LAB — BODY FLUID CELL COUNT WITH DIFFERENTIAL: Monocyte-Macrophage-Serous Fluid: 25 % — ABNORMAL LOW (ref 50–90)

## 2011-02-02 NOTE — ED Notes (Signed)
Pt up to the br with the dialysis rn

## 2011-02-05 LAB — BODY FLUID CULTURE: Culture: NO GROWTH

## 2011-02-11 ENCOUNTER — Other Ambulatory Visit: Payer: Self-pay | Admitting: Nephrology

## 2011-02-11 ENCOUNTER — Other Ambulatory Visit (HOSPITAL_COMMUNITY)
Admission: RE | Admit: 2011-02-11 | Discharge: 2011-02-11 | Disposition: A | Discharge status: Home / Self Care | Payer: Medicare Other | Source: Ambulatory Visit | Attending: Nephrology | Admitting: Nephrology

## 2011-02-11 DIAGNOSIS — K659 Peritonitis, unspecified: Secondary | ICD-10-CM | POA: Insufficient documentation

## 2011-07-02 ENCOUNTER — Encounter: Payer: Self-pay | Admitting: Vascular Surgery

## 2011-07-02 DIAGNOSIS — C801 Malignant (primary) neoplasm, unspecified: Secondary | ICD-10-CM

## 2011-07-02 HISTORY — DX: Malignant (primary) neoplasm, unspecified: C80.1

## 2011-07-14 ENCOUNTER — Encounter: Payer: Self-pay | Admitting: Vascular Surgery

## 2011-07-15 ENCOUNTER — Encounter: Payer: Self-pay | Admitting: Vascular Surgery

## 2011-07-15 ENCOUNTER — Encounter (INDEPENDENT_AMBULATORY_CARE_PROVIDER_SITE_OTHER): Payer: Medicare Other | Admitting: *Deleted

## 2011-07-15 ENCOUNTER — Ambulatory Visit (INDEPENDENT_AMBULATORY_CARE_PROVIDER_SITE_OTHER): Payer: Medicare Other | Admitting: Vascular Surgery

## 2011-07-15 VITALS — BP 146/55 | HR 62 | Resp 14 | Ht 63.0 in | Wt 111.2 lb

## 2011-07-15 DIAGNOSIS — N186 End stage renal disease: Secondary | ICD-10-CM

## 2011-07-15 DIAGNOSIS — Z48812 Encounter for surgical aftercare following surgery on the circulatory system: Secondary | ICD-10-CM

## 2011-07-15 HISTORY — DX: End stage renal disease: N18.6

## 2011-07-15 NOTE — Progress Notes (Signed)
[facility] HISTORY AND PHYSICAL    History of Present Illness:  Patient is a 78 y.o. year old female who presents for follow-up after placement of a left brachiocephalic AV fistula in July of 2011. She returns referred today by Dr. Eliott Nine to recheck her fistula as she may need to go to hemodialysis soon for failing peritoneal dialysis.  Denies any symptoms of numbness or tingling in her hand.  Other medical problems include diabetes hypertension hyperlipidemia atrial fibrillation requiring Coumadin. These are all currently stable.  Past Medical History  Diagnosis Date  . Hypertension   . Diabetes mellitus   . Renal failure   . Mitral valve prolapse   . Hyperlipidemia   . CHF (congestive heart failure)   . Atrial fibrillation   . Arthritis   . Cancer 07/02/11    Left face   History   Social History  . Marital Status: Married    Spouse Name: N/A    Number of Children: N/A  . Years of Education: N/A   Occupational History  . Not on file.   Social History Main Topics  . Smoking status: Never Smoker   . Smokeless tobacco: Not on file  . Alcohol Use: No  . Drug Use: No  . Sexually Active:    Other Topics Concern  . Not on file   Social History Narrative  . No narrative on file   Review of systems: Pulmonary she denies shortness of breath cardiac she denies chest pain GI she denies any abdominal pain nausea or vomiting  Physical Examination  Filed Vitals:   07/15/11 0957  BP: 146/55  Pulse: 62  Resp: 14    Body mass index is 19.70 kg/(m^2).  General:  Alert and oriented, no acute distress Neck: No bruit or JVD Skin: No rash Extremities:  Palpable thrill left brachiocephalic AV fistula, the fistula is palpable throughout most of the upper arm. Neurologic: Upper and lower extremity motor 5/5 and symmetric  DATA: Patient had a duplex exam of his AV fistula today. It shows a diameter is greater than 7/2 mm throughout its course. The  proximal half of the fistula is less than 2 mm from the skin. He gets slightly deeper in the upper arm.   ASSESSMENT:   Left brachiocephalic AV fistula should be ready for use if necessary.   PLAN:  Followup as needed   Fabienne Bruns, MD [facility] Office: [phone] Pager: [phone]

## 2011-07-22 NOTE — Procedures (Unsigned)
VASCULAR LAB EXAM  INDICATION:  Follow up left arm AV fistula.  HISTORY: End-stage renal disease, left brachiocephalic AV fistula 07/29/2008. Diabetes: Cardiac: Hypertension:  EXAM:  Left AV fistula duplex.  IMPRESSION: 1. Patent left brachial-to-cephalic arteriovenous fistula with a     velocity of 632 cm/s noted at the outflow vein just prior to the     arteriovenous fistula. 2. There is a branch noted in the mid brachium level outflow vein     measuring 0.52 cm. 3. Please see the following worksheet for depth, diameter, branch and     velocity measurements.     ___________________________________________ Janetta Hora. Fields, MD  EM/MEDQ  D:  07/16/2011  T:  07/16/2011  Job:  [id number]

## 2012-09-07 ENCOUNTER — Ambulatory Visit: Payer: Medicare Other | Admitting: Vascular Surgery

## 2012-09-13 ENCOUNTER — Ambulatory Visit: Payer: Medicare Other | Admitting: Vascular Surgery

## 2012-10-11 ENCOUNTER — Encounter: Payer: Self-pay | Admitting: Vascular Surgery

## 2012-10-12 ENCOUNTER — Encounter: Payer: Self-pay | Admitting: Vascular Surgery

## 2012-10-12 ENCOUNTER — Ambulatory Visit (INDEPENDENT_AMBULATORY_CARE_PROVIDER_SITE_OTHER): Payer: Medicare Other | Admitting: Vascular Surgery

## 2012-10-12 VITALS — BP 127/60 | HR 59 | Ht 63.0 in | Wt 113.0 lb

## 2012-10-12 DIAGNOSIS — N186 End stage renal disease: Secondary | ICD-10-CM

## 2012-10-12 DIAGNOSIS — T82898A Other specified complication of vascular prosthetic devices, implants and grafts, initial encounter: Secondary | ICD-10-CM

## 2012-10-12 NOTE — Progress Notes (Signed)
Patient is an 79 year old female who presents today for evaluation of possible occlusion left arm AV fistula. The fistula was placed in 2011. It was never used for hemodialysis. The patient is on peritoneal dialysis. She is currently having no problems with this. She states that apparently at least one intervention was done on the fistula and it was "stented".  Physical exam:  Filed Vitals:   10/12/12 0926  BP: 127/60  Pulse: 59  Height: 5\' 3"  (1.6 m)  Weight: 113 lb (51.256 kg)  SpO2: 100%   Left upper extremity: No palpable thrill or audible bruit left upper arm AV fistula  Assessment: Occluded left arm AV fistula in a patient currently on peritoneal dialysis. Her previous fistula had been in place for 3 years and has not used. Hemodialysis is not imminent. We'll delay any further access procedures until she possibly requires hemodialysis.  Plan: Followup as needed  Fabienne Bruns, MD [facility] Office: [phone] Pager: [phone]

## 2013-01-04 ENCOUNTER — Inpatient Hospital Stay (HOSPITAL_COMMUNITY)
Admission: EM | Admit: 2013-01-04 | Discharge: 2013-01-09 | DRG: 871 | Disposition: A | Discharge status: Home / Self Care | Payer: Medicare Other | Attending: Internal Medicine | Admitting: Internal Medicine

## 2013-01-04 ENCOUNTER — Encounter (HOSPITAL_COMMUNITY): Payer: Self-pay | Admitting: Emergency Medicine

## 2013-01-04 ENCOUNTER — Emergency Department (HOSPITAL_COMMUNITY): Payer: Medicare Other

## 2013-01-04 DIAGNOSIS — E039 Hypothyroidism, unspecified: Secondary | ICD-10-CM | POA: Diagnosis present

## 2013-01-04 DIAGNOSIS — Y841 Kidney dialysis as the cause of abnormal reaction of the patient, or of later complication, without mention of misadventure at the time of the procedure: Secondary | ICD-10-CM | POA: Diagnosis present

## 2013-01-04 DIAGNOSIS — M129 Arthropathy, unspecified: Secondary | ICD-10-CM | POA: Diagnosis present

## 2013-01-04 DIAGNOSIS — E119 Type 2 diabetes mellitus without complications: Secondary | ICD-10-CM | POA: Diagnosis present

## 2013-01-04 DIAGNOSIS — A419 Sepsis, unspecified organism: Secondary | ICD-10-CM

## 2013-01-04 DIAGNOSIS — Z992 Dependence on renal dialysis: Secondary | ICD-10-CM

## 2013-01-04 DIAGNOSIS — E46 Unspecified protein-calorie malnutrition: Secondary | ICD-10-CM

## 2013-01-04 DIAGNOSIS — K658 Other peritonitis: Secondary | ICD-10-CM | POA: Diagnosis present

## 2013-01-04 DIAGNOSIS — K625 Hemorrhage of anus and rectum: Secondary | ICD-10-CM | POA: Diagnosis present

## 2013-01-04 DIAGNOSIS — R195 Other fecal abnormalities: Secondary | ICD-10-CM | POA: Diagnosis present

## 2013-01-04 DIAGNOSIS — T82898A Other specified complication of vascular prosthetic devices, implants and grafts, initial encounter: Secondary | ICD-10-CM

## 2013-01-04 DIAGNOSIS — M810 Age-related osteoporosis without current pathological fracture: Secondary | ICD-10-CM | POA: Diagnosis present

## 2013-01-04 DIAGNOSIS — D631 Anemia in chronic kidney disease: Secondary | ICD-10-CM | POA: Diagnosis present

## 2013-01-04 DIAGNOSIS — I4891 Unspecified atrial fibrillation: Secondary | ICD-10-CM | POA: Diagnosis present

## 2013-01-04 DIAGNOSIS — Z905 Acquired absence of kidney: Secondary | ICD-10-CM

## 2013-01-04 DIAGNOSIS — M899 Disorder of bone, unspecified: Secondary | ICD-10-CM | POA: Diagnosis present

## 2013-01-04 DIAGNOSIS — R197 Diarrhea, unspecified: Secondary | ICD-10-CM | POA: Diagnosis present

## 2013-01-04 DIAGNOSIS — E871 Hypo-osmolality and hyponatremia: Secondary | ICD-10-CM | POA: Diagnosis present

## 2013-01-04 DIAGNOSIS — E876 Hypokalemia: Secondary | ICD-10-CM | POA: Diagnosis present

## 2013-01-04 DIAGNOSIS — K659 Peritonitis, unspecified: Secondary | ICD-10-CM

## 2013-01-04 DIAGNOSIS — T8571XS Infection and inflammatory reaction due to peritoneal dialysis catheter, sequela: Secondary | ICD-10-CM

## 2013-01-04 DIAGNOSIS — D649 Anemia, unspecified: Secondary | ICD-10-CM | POA: Diagnosis present

## 2013-01-04 DIAGNOSIS — D3000 Benign neoplasm of unspecified kidney: Secondary | ICD-10-CM | POA: Diagnosis present

## 2013-01-04 DIAGNOSIS — K5900 Constipation, unspecified: Secondary | ICD-10-CM

## 2013-01-04 DIAGNOSIS — R933 Abnormal findings on diagnostic imaging of other parts of digestive tract: Secondary | ICD-10-CM

## 2013-01-04 DIAGNOSIS — N186 End stage renal disease: Secondary | ICD-10-CM

## 2013-01-04 DIAGNOSIS — E785 Hyperlipidemia, unspecified: Secondary | ICD-10-CM | POA: Diagnosis present

## 2013-01-04 DIAGNOSIS — I509 Heart failure, unspecified: Secondary | ICD-10-CM | POA: Diagnosis present

## 2013-01-04 DIAGNOSIS — T8571XA Infection and inflammatory reaction due to peritoneal dialysis catheter, initial encounter: Secondary | ICD-10-CM | POA: Diagnosis present

## 2013-01-04 DIAGNOSIS — I059 Rheumatic mitral valve disease, unspecified: Secondary | ICD-10-CM | POA: Diagnosis present

## 2013-01-04 DIAGNOSIS — D72829 Elevated white blood cell count, unspecified: Secondary | ICD-10-CM | POA: Diagnosis present

## 2013-01-04 DIAGNOSIS — Z85828 Personal history of other malignant neoplasm of skin: Secondary | ICD-10-CM

## 2013-01-04 DIAGNOSIS — I120 Hypertensive chronic kidney disease with stage 5 chronic kidney disease or end stage renal disease: Secondary | ICD-10-CM | POA: Diagnosis present

## 2013-01-04 DIAGNOSIS — I959 Hypotension, unspecified: Secondary | ICD-10-CM | POA: Diagnosis present

## 2013-01-04 DIAGNOSIS — K5641 Fecal impaction: Secondary | ICD-10-CM | POA: Diagnosis present

## 2013-01-04 DIAGNOSIS — I5189 Other ill-defined heart diseases: Secondary | ICD-10-CM | POA: Diagnosis present

## 2013-01-04 DIAGNOSIS — Z79899 Other long term (current) drug therapy: Secondary | ICD-10-CM

## 2013-01-04 DIAGNOSIS — R5381 Other malaise: Secondary | ICD-10-CM | POA: Diagnosis present

## 2013-01-04 DIAGNOSIS — K6289 Other specified diseases of anus and rectum: Secondary | ICD-10-CM

## 2013-01-04 DIAGNOSIS — R159 Full incontinence of feces: Secondary | ICD-10-CM | POA: Diagnosis present

## 2013-01-04 DIAGNOSIS — Z7982 Long term (current) use of aspirin: Secondary | ICD-10-CM

## 2013-01-04 LAB — CBC WITH DIFFERENTIAL/PLATELET
Basophils Absolute: 0 10*3/uL (ref 0.0–0.1)
Eosinophils Absolute: 0.1 10*3/uL (ref 0.0–0.7)
Lymphs Abs: 2.3 10*3/uL (ref 0.7–4.0)
MCH: 28.5 pg (ref 26.0–34.0)
Neutrophils Relative %: 89 % — ABNORMAL HIGH (ref 43–77)
Platelets: 404 10*3/uL — ABNORMAL HIGH (ref 150–400)
RBC: 3.16 MIL/uL — ABNORMAL LOW (ref 3.87–5.11)
RDW: 16.5 % — ABNORMAL HIGH (ref 11.5–15.5)
WBC: 34.9 10*3/uL — ABNORMAL HIGH (ref 4.0–10.5)

## 2013-01-04 LAB — LACTATE DEHYDROGENASE, PLEURAL OR PERITONEAL FLUID: LD, Fluid: 41 U/L — ABNORMAL HIGH (ref 3–23)

## 2013-01-04 LAB — OCCULT BLOOD, POC DEVICE: Fecal Occult Bld: POSITIVE — AB

## 2013-01-04 LAB — COMPREHENSIVE METABOLIC PANEL
ALT: 12 U/L (ref 0–35)
AST: 20 U/L (ref 0–37)
Albumin: 2.3 g/dL — ABNORMAL LOW (ref 3.5–5.2)
Alkaline Phosphatase: 153 U/L — ABNORMAL HIGH (ref 39–117)
GFR calc Af Amer: 7 mL/min — ABNORMAL LOW (ref >90)
Glucose, Bld: 133 mg/dL — ABNORMAL HIGH (ref 70–99)
Potassium: 2.9 mEq/L — ABNORMAL LOW (ref 3.5–5.1)
Sodium: 131 mEq/L — ABNORMAL LOW (ref 135–145)
Total Protein: 6.5 g/dL (ref 6.0–8.3)

## 2013-01-04 LAB — GLUCOSE, SEROUS FLUID: Glucose, Fluid: 194 mg/dL

## 2013-01-04 LAB — BODY FLUID CELL COUNT WITH DIFFERENTIAL
Lymphs, Fluid: 2 %
Neutrophil Count, Fluid: 64 % — ABNORMAL HIGH (ref 0–25)
Total Nucleated Cell Count, Fluid: 1014 cu mm — ABNORMAL HIGH (ref 0–1000)

## 2013-01-04 LAB — AMYLASE, BODY FLUID

## 2013-01-04 LAB — LIPASE, BLOOD: Lipase: 17 U/L (ref 11–59)

## 2013-01-04 MED ORDER — DELFLEX-LC/1.5% DEXTROSE 346 MOSM/L IP SOLN
INTRAPERITONEAL | Status: DC
  Administered 2013-01-04 – 2013-01-05 (×3): 2000 mL via INTRAPERITONEAL
  Administered 2013-01-06 (×2): via INTRAPERITONEAL
  Administered 2013-01-07: 1000 mL via INTRAPERITONEAL

## 2013-01-04 MED ORDER — POTASSIUM CHLORIDE 2 MEQ/ML IV SOLN
12.0000 meq | INTRAVENOUS | Status: DC | PRN
  Administered 2013-01-04 – 2013-01-06 (×8): 12 meq via INTRAPERITONEAL
  Filled 2013-01-04 (×6): qty 6

## 2013-01-04 MED ORDER — ACETAMINOPHEN 650 MG RE SUPP: 650.0000 mg | Freq: Four times a day (QID) | RECTAL | Status: DC | PRN

## 2013-01-04 MED ORDER — DELFLEX-LC/1.5% DEXTROSE 346 MOSM/L IP SOLN
INTRAPERITONEAL | Status: DC
  Administered 2013-01-05: 05:00:00 via INTRAPERITONEAL

## 2013-01-04 MED ORDER — IOHEXOL 300 MG/ML  SOLN: 25.0000 mL | INTRAMUSCULAR | Status: AC

## 2013-01-04 MED ORDER — SODIUM CHLORIDE 0.9 % IJ SOLN
3.0000 mL | Freq: Two times a day (BID) | INTRAMUSCULAR | Status: DC
  Administered 2013-01-05 – 2013-01-06 (×2): 3 mL via INTRAVENOUS

## 2013-01-04 MED ORDER — SODIUM CHLORIDE 0.9 % IV SOLN: 250.0000 mL | INTRAVENOUS | Status: DC | PRN

## 2013-01-04 MED ORDER — PANTOPRAZOLE SODIUM 40 MG PO TBEC: 40.0000 mg | DELAYED_RELEASE_TABLET | Freq: Every day | ORAL | Status: DC

## 2013-01-04 MED ORDER — LEVOTHYROXINE SODIUM 50 MCG PO TABS
50.0000 ug | ORAL_TABLET | Freq: Every day | ORAL | Status: DC
  Administered 2013-01-05 – 2013-01-09 (×5): 50 ug via ORAL
  Filled 2013-01-04 (×7): qty 1

## 2013-01-04 MED ORDER — SODIUM CHLORIDE 0.9 % IJ SOLN: 3.0000 mL | INTRAMUSCULAR | Status: DC | PRN

## 2013-01-04 MED ORDER — LEVOFLOXACIN IN D5W 500 MG/100ML IV SOLN
500.0000 mg | INTRAVENOUS | Status: DC
  Administered 2013-01-07 – 2013-01-08 (×2): 500 mg via INTRAVENOUS
  Filled 2013-01-04 (×3): qty 100

## 2013-01-04 MED ORDER — ACETAMINOPHEN 325 MG PO TABS
650.0000 mg | ORAL_TABLET | Freq: Four times a day (QID) | ORAL | Status: DC | PRN
  Administered 2013-01-06 – 2013-01-07 (×3): 650 mg via ORAL
  Filled 2013-01-04 (×3): qty 2

## 2013-01-04 MED ORDER — SODIUM CHLORIDE 0.9 % IV BOLUS (SEPSIS): 250.0000 mL | Freq: Once | INTRAVENOUS | Status: DC

## 2013-01-04 MED ORDER — HYDROCODONE-ACETAMINOPHEN 5-325 MG PO TABS
1.0000 | ORAL_TABLET | Freq: Once | ORAL | Status: AC
  Administered 2013-01-04: 1 via ORAL
  Filled 2013-01-04: qty 1

## 2013-01-04 MED ORDER — AMIODARONE HCL 100 MG PO TABS
100.0000 mg | ORAL_TABLET | Freq: Every day | ORAL | Status: DC
  Administered 2013-01-05 – 2013-01-09 (×5): 100 mg via ORAL
  Filled 2013-01-04 (×5): qty 1

## 2013-01-04 MED ORDER — IOHEXOL 300 MG/ML  SOLN
100.0000 mL | Freq: Once | INTRAMUSCULAR | Status: AC | PRN
  Administered 2013-01-04: 100 mL via INTRAVENOUS

## 2013-01-04 MED ORDER — SODIUM CHLORIDE 0.9 % IV BOLUS (SEPSIS)
500.0000 mL | Freq: Once | INTRAVENOUS | Status: AC
  Administered 2013-01-04: 500 mL via INTRAVENOUS

## 2013-01-04 MED ORDER — SODIUM CHLORIDE 0.9 % IJ SOLN
3.0000 mL | Freq: Two times a day (BID) | INTRAMUSCULAR | Status: DC
  Administered 2013-01-06 – 2013-01-08 (×2): 3 mL via INTRAVENOUS

## 2013-01-04 MED ORDER — POTASSIUM CHLORIDE 2 MEQ/ML IV SOLN
8.0000 meq | INTRAVENOUS | Status: DC
  Filled 2013-01-04: qty 4

## 2013-01-04 MED ORDER — SEVELAMER CARBONATE 800 MG PO TABS
2400.0000 mg | ORAL_TABLET | Freq: Three times a day (TID) | ORAL | Status: DC
  Administered 2013-01-05 – 2013-01-06 (×6): 2400 mg via ORAL
  Filled 2013-01-04 (×10): qty 3

## 2013-01-04 MED ORDER — LEVOFLOXACIN IN D5W 750 MG/150ML IV SOLN
750.0000 mg | Freq: Once | INTRAVENOUS | Status: AC
  Administered 2013-01-04: 750 mg via INTRAVENOUS
  Filled 2013-01-04: qty 150

## 2013-01-04 MED ORDER — DELFLEX-LC/1.5% DEXTROSE 346 MOSM/L IP SOLN
Freq: Once | INTRAPERITONEAL | Status: AC
  Administered 2013-01-04: 16:00:00 via INTRAPERITONEAL

## 2013-01-04 MED ORDER — ROPINIROLE HCL 0.25 MG PO TABS
0.2500 mg | ORAL_TABLET | Freq: Every day | ORAL | Status: DC
  Administered 2013-01-04 – 2013-01-08 (×5): 0.25 mg via ORAL
  Filled 2013-01-04 (×6): qty 1

## 2013-01-04 MED ORDER — VANCOMYCIN HCL 10 G IV SOLR
1500.0000 mg | Freq: Once | INTRAVENOUS | Status: AC
  Administered 2013-01-04: 1500 mg via INTRAVENOUS
  Filled 2013-01-04: qty 1500

## 2013-01-04 MED ORDER — GABAPENTIN 100 MG PO CAPS
100.0000 mg | ORAL_CAPSULE | Freq: Two times a day (BID) | ORAL | Status: DC
  Administered 2013-01-04 – 2013-01-09 (×10): 100 mg via ORAL
  Filled 2013-01-04 (×11): qty 1

## 2013-01-04 MED ORDER — SIMVASTATIN 10 MG PO TABS
10.0000 mg | ORAL_TABLET | Freq: Every day | ORAL | Status: DC
  Administered 2013-01-04 – 2013-01-08 (×5): 10 mg via ORAL
  Filled 2013-01-04 (×6): qty 1

## 2013-01-04 NOTE — ED Notes (Signed)
NOTIFIED DR Kirtland Bouchard. Denton Lank IN PERSON OF OCCULT BLOOD TEST WITH POSITIVE ( + ) RESULTS @ 13:18 PM , 01/04/2013.

## 2013-01-04 NOTE — ED Notes (Signed)
Lab called to obtain blood cultures x 2.

## 2013-01-04 NOTE — ED Notes (Signed)
Per pt family pt was constipated and  Had impaction a few days ago. sts took some OTC meds and disimpacted and since has had diarrhea, fever, rectal pain and bleeding. Pt is dialysis pt.

## 2013-01-04 NOTE — ED Provider Notes (Signed)
Patient signed out to me by Dr. Denton Lank an abdominal CT reviewed along with patient's peritoneal fluid. Patient's coronary CT antibiotics for her now diagnosed peritonitis. I spoke with nephrology, Dr. Briant Cedar, he has placed more orders for treatment of the patient's peritonitis. I will admit the patient to the medicine service. Family has been informed  Toy Baker, MD 01/04/13 [phone]

## 2013-01-04 NOTE — ED Notes (Signed)
Pt daughter-in-law Tammy. Cell # [phone]. Home # [id number].

## 2013-01-04 NOTE — ED Notes (Signed)
MD in room

## 2013-01-04 NOTE — Progress Notes (Signed)
ANTIBIOTIC CONSULT NOTE - INITIAL  Pharmacy Consult for levaquin + vancomycin Indication: empiric for unknown source of infection  Allergies  Allergen Reactions  . Latex   . Other     Pneumonia shot   . Penicillins   . Sulfa Antibiotics     Patient Measurements:   Adjusted Body Weight:   Vital Signs: Temp: 98.6 F (37 C) (11/20 1228) Temp src: Oral (11/20 1228) BP: 94/54 mmHg (11/20 1558) Pulse Rate: 79 (11/20 1558) Intake/Output from previous day:   Intake/Output from this shift:    Labs:  Recent Labs  01/04/13 1311  WBC 34.9*  HGB 9.0*  PLT 404*  CREATININE 5.75*   The CrCl is unknown because both a height and weight (above a minimum accepted value) are required for this calculation. No results found for this basename: VANCOTROUGH, VANCOPEAK, VANCORANDOM, GENTTROUGH, GENTPEAK, GENTRANDOM, TOBRATROUGH, TOBRAPEAK, TOBRARND, AMIKACINPEAK, AMIKACINTROU, AMIKACIN,  in the last 72 hours   Microbiology: No results found for this or any previous visit (from the past 720 hour(s)).  Medical History: Past Medical History  Diagnosis Date  . Hypertension   . Diabetes mellitus   . Renal failure   . Mitral valve prolapse   . Hyperlipidemia   . CHF (congestive heart failure)   . Atrial fibrillation   . Arthritis   . Cancer 07/02/11    Left face    Medications:  Anti-infectives   Start     Dose/Rate Route Frequency Ordered Stop   01/04/13 1715  levofloxacin (LEVAQUIN) IVPB 750 mg     750 mg 100 mL/hr over 90 Minutes Intravenous  Once 01/04/13 1706     01/04/13 1715  vancomycin (VANCOCIN) 1,500 mg in sodium chloride 0.9 % 500 mL IVPB     1,500 mg 250 mL/hr over 120 Minutes Intravenous  Once 01/04/13 1706       Assessment: 82 yof on chronic peritoneal dialysis to start empiric vancomycin + levaquin for possible infection with unknown source. Pt is afebrile but WBC is significantly elevated at 34.9. To dose antibiotics IV for now.   Goal of Therapy:  Vancomycin  trough level 15-20 mcg/ml  Plan:  1. Vancomycin 1500mg  IV x 1 then will redose based on level 2. Levaquin 750mg  IV x 1 then 500mg  IV Q48H 3. F/u renal fxn, C&S, clinical status and vanc levels when appropriate  Daniela Siebers, Drake Leach 01/04/2013,5:07 PM

## 2013-01-04 NOTE — H&P (Addendum)
Date: 01/04/2013               Patient Name:  Valerie Yoder MRN: [id number]  DOB: 01-Sep-1930 Age / Sex: 79 y.o., female   PCP: Gaye Alken, NP         Medical Service: Internal Medicine Teaching Service         Attending Physician: Dr. Jonah Blue, DO    First Contact: Dr. Yetta Barre Pager: [phone]  Second Contact: Dr. Shirlee Latch Pager: [phone]       After Hours (After 5p/  First Contact Pager: [phone]  weekends / holidays): Second Contact Pager: [phone]   Chief Complaint: Fever/ Rectal Pain  History of Present Illness: [name] is a 79 year old white female with PMH of ESRD on peritoneal dialysis x4 years, HTN, DMT2 (diet controlled), PAF, HTD, CHF (no echo on file).  Patient reports that last week she was constipated and saw her PCP, she was prescribed lactulose for constipation.  She subsequently tried to disimpact herself on Sunday night and may have cut herself in that area with her fingernail she claims.  She reports that since then she has had multiple 5-6 small loose BMs a day and has noticed occasional blood in her stool.  Since then, she endorses mild diffuse abdominal pain x3 days that is associated with fever and chills (Tmax at home 102) and decreased appetite.  She is most concerned with rectal pain that started after her attempted self disimpaction several nights ago.   Nephrologist: Dr Allena Katz. She endorses daily PD ~10 hours a day with baseline hypotension with SBP 70-90s.    Meds: Current Facility-Administered Medications  Medication Dose Route Frequency Provider Last Rate Last Dose  . dialysis solution 1.5% low-MG/low-CA (DELFLEX) CAPD   Intraperitoneal Continuous Kerin Salen, PA-C      . dialysis solution 1.5% low-MG/low-CA (DELFLEX) CAPD   Intraperitoneal Continuous Dyke Maes, MD      . Melene Muller ON 01/06/2013] levofloxacin (LEVAQUIN) IVPB 500 mg  500 mg Intravenous Q48H Rachel Lynn Rumbarger, RPH      . levofloxacin (LEVAQUIN) IVPB 750 mg  750 mg  Intravenous Once [employer] Rum[employer]ger, [facility]      . potassium chloride injection 8 mEq  8 mEq Intraperitoneal Continuous Dyke Maes, MD       Current Outpatient Prescriptions  Medication Sig Dispense Refill  . acetaminophen (TYLENOL) 500 MG tablet Take 1,000 mg by mouth daily as needed for fever.      Marland Kitchen amiodarone (PACERONE) 200 MG tablet Take 100 mg by mouth daily. Take 1 tablet once daily for 7 days, then take 1/2 tablet daily      . aspirin 81 MG tablet Take 81 mg by mouth daily.      Marland Kitchen azithromycin (ZITHROMAX) 250 MG tablet Take 250 mg by mouth daily. Take 2 tablets the first day, then take 1 tablet daily for 4 days.      . clopidogrel (PLAVIX) 75 MG tablet Take 75 mg by mouth daily.      . diclofenac sodium (VOLTAREN) 1 % GEL Apply 2 g topically 4 (four) times daily.      Marland Kitchen docusate sodium (COLACE) 100 MG capsule Take 100 mg by mouth 2 (two) times daily.      Marland Kitchen gabapentin (NEURONTIN) 100 MG capsule Take 100 mg by mouth 2 (two) times daily.      Marland Kitchen levothyroxine (SYNTHROID, LEVOTHROID) 50 MCG tablet Take 50 mcg by mouth daily.       Marland Kitchen  lidocaine (LIDODERM) 5 % Place 1 patch onto the skin daily. Remove & Discard patch within 12 hours or as directed by MD      . lovastatin (MEVACOR) 20 MG tablet Take 20 mg by mouth at bedtime.       . mirtazapine (REMERON) 15 MG tablet Take 15 mg by mouth at bedtime.      Marland Kitchen omeprazole (PRILOSEC) 20 MG capsule Take 20 mg by mouth 2 (two) times daily before a meal.      . rOPINIRole (REQUIP) 0.25 MG tablet Take 0.25 mg by mouth daily.      . sevelamer carbonate (RENVELA) 800 MG tablet Take 2,400 mg by mouth 3 (three) times daily with meals.      . lactulose (CHRONULAC) 10 GM/15ML solution Take 10 g by mouth every 2 (two) hours as needed. Until diarrhea       Allergies: Allergies as of 01/04/2013 - Review Complete 01/04/2013  Allergen Reaction Noted  . Latex  02/01/2011  . Other  02/01/2011  . Penicillins  02/01/2011  . Sulfa antibiotics  02/01/2011    Past Medical History  Diagnosis Date  . Hypertension   . Diabetes mellitus   . Renal failure   . Mitral valve prolapse   . Hyperlipidemia   . CHF (congestive heart failure)   . Atrial fibrillation   . Arthritis   . Cancer 07/02/11    Left face   Past Surgical History  Procedure Laterality Date  . Nephrectomy      right kidney  . Abdominal hysterectomy    . Appendectomy    . Tonsillectomy    . Av fistula placement  2010    left brachiocephalic AVF  . Peritoneal catheter insertion  2011   History reviewed. No pertinent family history. History   Social History  . Marital Status: Married    Spouse Name: N/A    Number of Children: N/A  . Years of Education: N/A   Occupational History  . Not on file.   Social History Main Topics  . Smoking status: Never Smoker   . Smokeless tobacco: Not on file  . Alcohol Use: No  . Drug Use: No  . Sexual Activity:    Other Topics Concern  . Not on file   Social History Narrative  . No narrative on file   Review of Systems: Review of Systems  Constitutional: Positive for fever, chills and malaise/fatigue.  HENT: Negative for congestion and sore throat.   Eyes: Negative for blurred vision.  Respiratory: Negative for cough, sputum production, shortness of breath and wheezing.   Cardiovascular: Negative for chest pain, palpitations and leg swelling.  Gastrointestinal: Positive for abdominal pain, diarrhea, constipation and blood in stool. Negative for heartburn, nausea, vomiting and melena.  Genitourinary:       Does not make urine x 3years  Musculoskeletal: Negative for back pain.  Neurological: Positive for dizziness. Negative for focal weakness, weakness and headaches.    Physical Exam: Blood pressure 89/49, pulse 65, temperature 98.6 F (37 C), temperature source Oral, resp. rate 13, SpO2 95.00%. Physical Exam  Nursing note and vitals reviewed. Constitutional: She is oriented to person, place, and time and  well-developed, well-nourished, and in no distress. No distress.  HENT:  Head: Normocephalic and atraumatic.  Eyes: Conjunctivae and EOM are normal. Pupils are equal, round, and reactive to light.  Cardiovascular: Normal rate, regular rhythm, normal heart sounds and intact distal pulses.   No murmur heard. Pulmonary/Chest: Effort normal  and breath sounds normal. No respiratory distress. She has no wheezes.  Abdominal: Soft. Bowel sounds are normal. She exhibits no distension. There is tenderness (mild diffuse). There is no rebound.  Genitourinary: Rectal exam shows external hemorrhoid.  Musculoskeletal: She exhibits no edema.  Neurological: She is alert and oriented to person, place, and time. No cranial nerve deficit.  Skin: Skin is warm and dry. She is not diaphoretic.   Lab results: Basic Metabolic Panel:  Recent Labs  02/16/70 1311  NA 131*  K 2.9*  CL 87*  CO2 26  GLUCOSE 133*  BUN 42*  CREATININE 5.75*  CALCIUM 9.3   Liver Function Tests:  Recent Labs  01/04/13 1311  AST 20  ALT 12  ALKPHOS 153*  BILITOT 0.4  PROT 6.5  ALBUMIN 2.3*   CBC:  Recent Labs  01/04/13 1311  WBC 34.9*  NEUTROABS 30.9*  HGB 9.0*  HCT 27.0*  MCV 85.4  PLT 404*   Coagulation:  Recent Labs  01/04/13 1311  LABPROT 14.6  INR 1.16   Imaging results:  Dg Abd 1 View  01/04/2013   CLINICAL DATA:  Abdominal pain and vomiting. Status post placement of peritoneal catheter 3 days ago.  EXAM: ABDOMEN - 1 VIEW  COMPARISON:  CT abdomen and pelvis 12/05/2011.  FINDINGS: Peritoneal dialysis catheter from a left lower quadrant approach is in place with the tip in the pelvis. There is a large volume of stool throughout the colon. No evidence of bowel obstruction is seen. No abnormal abdominal calcification is identified. Convex left scoliosis is noted.  IMPRESSION: No acute finding.  Peritoneal dialysis catheter in place without evidence of complication.  Large volume of stool throughout the  colon.   Electronically Signed   By: Drusilla Kanner M.D.   On: 01/04/2013 13:43   Ct Abdomen Pelvis W Contrast  01/04/2013   CLINICAL DATA:  Abdominal pain. Constipation and now diarrhea, fever, rectal pain and bleeding.  EXAM: CT ABDOMEN AND PELVIS WITH CONTRAST  TECHNIQUE: Multidetector CT imaging of the abdomen and pelvis was performed using the standard protocol following bolus administration of intravenous contrast.  CONTRAST:  OMNIPAQUE IOHEXOL 300 MG/ML  SOLN  COMPARISON:  12/05/2011  FINDINGS: The lung bases demonstrate by lateral linear density likely atelectasis with small amount of right pleural fluid. Coronary artery calcifications are present. There is mild cardiomegaly.  Abdominal images demonstrate evidence of patient's peritoneal dialysis catheter entering the left lower quadrant with tip over the pelvis just left of midline without significant change. There is mild ascites present likely due to patient's peritoneal dialysis slightly more pronounced compared to the prior exam. The liver, spleen, pancreas, gallbladder and adrenal glands are within normal. There is absence of the right kidney. The left kidney demonstrates a well-defined 1.9 cm homogeneously hypodense exophytic mass over the upper pole with Hounsfield unit measurements of 24 unchanged in size since 2012 and likely a slightly hyperdense cyst. There is continued evidence of a 2.3 cm solid and fatty mass over the superior pole of the left kidney as this is unchanged in size compared to 2012, although demonstrates slightly less fat component. Appendix is not definitely seen. There is moderate calcified atherosclerotic plaque of the abdominal aorta iliac vessels.  There is no evidence of bowel obstruction. Air is present throughout the colon. There are a few small flecks of free air over the anterior aspect of the left abdomen just above the peritoneal dialysis catheter entry point without change from the prior  exams.  Pelvic  images demonstrate suggestion mild wall thickening over the distal rectal region. There is no change in mild compression deformity of T12 as there is also a mild compression deformity of T9 age-indeterminate.  IMPRESSION: Mild wall thickening over the distal rectum which may be due to proctitis.  Left-sided peritoneal dialysis catheter with tip over the pelvis just left of midline without significant change. Ascites slightly worse.  Mild bibasilar atelectatic change and small amount of right pleural fluid.  Probable hyperdense cyst over the upper pole of the left kidney unchanged from 2012. 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 Recommend followup CT in 6 months.  Stable T12 compression fracture with mild T9 compression fracture age indeterminate.   Electronically Signed   By: Elberta Fortis M.D.   On: 01/04/2013 17:54   Other results: EKG: difficult to interpret, sinus, regular rate, normal axis, no obvious ST or T wave changes.  Assessment & Plan by Problem:  Ms. Balboni is a 79 year old female with a PMH of ESRD admitted for peritonitis.    Peritonitis meeting Sepsis criteria -Patient with history of ESRD on peritoneal dialysis presented with 3-4 days history of fever/chills, diffuse abdominal pain, fatigue, and found to have a leukocytosis of 35k, lactic acid of 2.6. Baseline hypotension per patient with SBP 70-90s.  Nephrology was consulted in the ED and obtained a peritoneal fluid cell count and culture, cell count revealed a WBC 1014.  Other causes of patient's abdominal pain, fever, and leukocytosis considered: PNA: no cough, SOB. UTI: Patient does not make urine, no CVA tenderness.  Pancreatitis: less likely given no nausea/vomiting mild diffuse abdominal pain without radiation but will check  lipase. - Admit to Stepdown, monitor on telemetry (inpatient) - Continue Vancomycin and Levaquin as patient has a penicillin allergy and for additional pseudomonal coverage. - Follow Peritoneal fluid culture, Blood cultures - No EKG changes, cycle CE - F/U lipase - AM labs: cbc, bmet    End stage renal disease - Patient has been on peritoneal dialysis x 4 years. - Nephrology consulted in ED - Continue peritoneal dialysis per renal. -  Hypokalemia, Hyponatremia noted.  Management with dialysis orders.  Check Mg++ in AM  Proctitis - Patient describes attempting to disimpact herself 3 days ago, since then she has had perirectal pain and seen some blood in stools.  FOBT +.  She was noted to have mild wall thickening of the distal rectum.  She denies any history of UC or radiation. - Patient disimpacted in ED. - Patient with diarrhea and was on antibiotics on home with azithromycin, will check C. diff - No sign of anal fissure on exam. - Patient on broad spectrum antibiotics for peritonitis, no sign of abscess formation on CT. - Consider outpatient colonoscopy - Trend cbc, Hb 9 on admission  Hx of Afib--was previously on coumadin, now on plavix. Also on amiodarone. In sinus rhythm on admission.  -holding plavix in setting of rectal bleeding -continue amiodarone  -repeat EKG in AM  Osteoporosis - Patient has ESRD. Stable T 12 compression fracture, mild T9 compression fracture.  Alk phos elevated without elevation of AST/ALT. - Management as outpatient.  Protein Calorie Malnutrition - Patient admits decreased PO intake x 2 days.  Albumin 2.3 on  admission. - Consider nutrition consult.  Presumed Renal Angiomyolipoma -Recommend follow up CT in 6 months   Code Status: Full Diet: Renal DVT PPx:  SCDs as patient is hemoccult positive Dispo: Disposition is deferred at this time, awaiting improvement of current medical problems. Anticipated discharge in approximately 2 day(s).   The  patient does have a current PCP (Amy Moon, NP) and does not need an [facility] hospital follow-up appointment after discharge.  The patient does not have transportation limitations that hinder transportation to clinic appointments.  Signed: Carlynn Purl, DO 01/04/2013, 8:59 PM

## 2013-01-04 NOTE — ED Notes (Signed)
Pt transported to CT scan.

## 2013-01-04 NOTE — ED Notes (Signed)
Pt finished drinking oral contrast, CT notified.  

## 2013-01-04 NOTE — ED Notes (Addendum)
Pt drinking oral contrast at the time. Family at bedside. Dr. Denton Lank notified of hypotension.

## 2013-01-04 NOTE — ED Notes (Signed)
Renal nurse at bedside to perform peritoneal dialysis.

## 2013-01-04 NOTE — ED Notes (Signed)
vitals taken by Sharyl Nimrod ([facility] [employer] Student)

## 2013-01-04 NOTE — ED Provider Notes (Addendum)
CSN: [id number]     Arrival date & time 01/04/13  1222 History   First MD Initiated Contact with Patient 01/04/13 1249     Chief Complaint  Patient presents with  . Rectal Pain  . Diarrhea   (Consider location/radiation/quality/duration/timing/severity/associated sxs/prior Treatment) Patient is a 79 y.o. female presenting with diarrhea. The history is provided by the patient and a relative.  Diarrhea Associated symptoms: abdominal pain   Associated symptoms: no chills, no fever and no headaches   pt c/o recent constipation followed by loose stools/?diarrhea.  Pt states had been constipated, saw pcp earlier in week, was given lactulose for constipation.  Pt also tried to disimpact self and scratched self w long finger nails. Subsequently pt noted by family to have 5-7 liquidy, small volume stools in past day, ?small amt bright red blood w earlier bm, but most recent bms brown, liquid, no melena or gross blood. Pt notes lower abd crampy pain, dull, mod, no radiation.  Pt w hx esrd on peritoneal dialyses, reports had normal exchange today, and dialyses nurse noted fluid normal in appearance, but was not tested. Family member notes t 101 today, pt denies chills.sweats. Pt requests dose pain med, states takes hydrocodone at home.  No abd distension. No nv. Pt was recently on abx, zpack, for small red spot to right knee which has resolved.      Past Medical History  Diagnosis Date  . Hypertension   . Diabetes mellitus   . Renal failure   . Mitral valve prolapse   . Hyperlipidemia   . CHF (congestive heart failure)   . Atrial fibrillation   . Arthritis   . Cancer 07/02/11    Left face   Past Surgical History  Procedure Laterality Date  . Nephrectomy      right kidney  . Abdominal hysterectomy    . Appendectomy    . Tonsillectomy    . Av fistula placement  2010    left brachiocephalic AVF  . Peritoneal catheter insertion  2011   History reviewed. No pertinent family  history. History  Substance Use Topics  . Smoking status: Never Smoker   . Smokeless tobacco: Not on file  . Alcohol Use: No   OB History   Grav Para Term Preterm Abortions TAB SAB Ect Mult Living                 Review of Systems  Constitutional: Negative for fever and chills.  HENT: Negative for sore throat.   Eyes: Negative for redness.  Respiratory: Negative for shortness of breath.   Cardiovascular: Negative for chest pain.  Gastrointestinal: Positive for abdominal pain, diarrhea and constipation.  Genitourinary: Negative for flank pain.       Makes no urine at baseline.   Musculoskeletal: Negative for back pain and neck pain.  Skin: Negative for rash.  Neurological: Negative for headaches.  Hematological: Does not bruise/bleed easily.  Psychiatric/Behavioral: Negative for confusion.    Allergies  Latex; Other; Penicillins; and Sulfa antibiotics  Home Medications   Current Outpatient Rx  Name  Route  Sig  Dispense  Refill  . amiodarone (PACERONE) 200 MG tablet   Oral   Take 200 mg by mouth daily.           Marland Kitchen aspirin 81 MG tablet   Oral   Take 81 mg by mouth daily.         . calcitRIOL (ROCALTROL) 0.25 MCG capsule   Oral   Take 0.25  mcg by mouth daily.           . calcium elemental as carbonate (TUMS ULTRA 1000) 400 MG tablet   Oral   Chew 1,000 mg by mouth 3 (three) times daily.           . clopidogrel (PLAVIX) 75 MG tablet   Oral   Take 75 mg by mouth daily.         Marland Kitchen estradiol (ESTRACE) 1 MG tablet   Oral   Take 1 mg by mouth daily.           . folic acid (FOLVITE) 800 MCG tablet   Oral   Take 800 mcg by mouth daily.           Marland Kitchen gabapentin (NEURONTIN) 100 MG capsule   Oral   Take 100 mg by mouth 2 (two) times daily.         Marland Kitchen lactulose (CHRONULAC) 10 GM/15ML solution   Oral   Take 20 g by mouth every 2 (two) hours as needed. Until diarrhea          . levothyroxine (SYNTHROID, LEVOTHROID) 50 MCG tablet   Oral   Take 50  mcg by mouth daily.           Marland Kitchen lovastatin (MEVACOR) 20 MG tablet   Oral   Take 20 mg by mouth at bedtime.           . metoCLOPramide (REGLAN) 5 MG tablet   Oral   Take 5 mg by mouth 4 (four) times daily.           . potassium chloride (KLOR-CON) 10 MEQ CR tablet   Oral   Take 20 mEq by mouth daily.           . promethazine (PHENERGAN) 25 MG tablet   Oral   Take 6.25-12.5 mg by mouth every 6 (six) hours as needed. For nausea/vomiting          . rOPINIRole (REQUIP) 0.25 MG tablet   Oral   Take 0.25 mg by mouth daily.         . traMADol (ULTRAM) 50 MG tablet   Oral   Take 50 mg by mouth every 6 (six) hours as needed for pain.         Marland Kitchen warfarin (COUMADIN) 1 MG tablet   Oral   Take 1 mg by mouth daily. Alternates between a 1mg  and 2mg  tablets          . warfarin (COUMADIN) 2 MG tablet   Oral   Take 2 mg by mouth daily. Alternates between 1mg  and 2mg  tablets           BP 91/59  Pulse 88  Temp(Src) 98.6 F (37 C) (Oral)  Resp 14  SpO2 100% Physical Exam  Nursing note and vitals reviewed. Constitutional: She appears well-developed and well-nourished. No distress.  HENT:  Mouth/Throat: Oropharynx is clear and moist.  Eyes: Conjunctivae are normal. No scleral icterus.  Neck: Neck supple. No tracheal deviation present.  Cardiovascular: Normal rate, regular rhythm, normal heart sounds and intact distal pulses.   Pulmonary/Chest: Effort normal and breath sounds normal. No respiratory distress.  Abdominal: Soft. Normal appearance and bowel sounds are normal. She exhibits no distension and no mass. There is tenderness. There is no rebound and no guarding.  Lower abd tenderness.  No erythema, tenderness or swelling at dialyses catheter site.     Genitourinary:  No cva tenderness. Rectal exam, large firm  stool, no melena or gross blood.    Musculoskeletal: She exhibits no edema.  Neurological: She is alert.  Skin: Skin is warm and dry. No rash noted.   Psychiatric: She has a normal mood and affect.    ED Course  Procedures (including critical care time)  Results for orders placed during the hospital encounter of 01/04/13  CBC WITH DIFFERENTIAL      Result Value Range   WBC 34.9 (*) 4.0 - 10.5 K/uL   RBC 3.16 (*) 3.87 - 5.11 MIL/uL   Hemoglobin 9.0 (*) 12.0 - 15.0 g/dL   HCT 56.3 (*) 87.5 - 64.3 %   MCV 85.4  78.0 - 100.0 fL   MCH 28.5  26.0 - 34.0 pg   MCHC 33.3  30.0 - 36.0 g/dL   RDW 32.9 (*) 51.8 - 84.1 %   Platelets 404 (*) 150 - 400 K/uL   Neutrophils Relative % 89 (*) 43 - 77 %   Neutro Abs 30.9 (*) 1.7 - 7.7 K/uL   Lymphocytes Relative 7 (*) 12 - 46 %   Lymphs Abs 2.3  0.7 - 4.0 K/uL   Monocytes Relative 5  3 - 12 %   Monocytes Absolute 1.6 (*) 0.1 - 1.0 K/uL   Eosinophils Relative 0  0 - 5 %   Eosinophils Absolute 0.1  0.0 - 0.7 K/uL   Basophils Relative 0  0 - 1 %   Basophils Absolute 0.0  0.0 - 0.1 K/uL  COMPREHENSIVE METABOLIC PANEL      Result Value Range   Sodium 131 (*) 135 - 145 mEq/L   Potassium 2.9 (*) 3.5 - 5.1 mEq/L   Chloride 87 (*) 96 - 112 mEq/L   CO2 26  19 - 32 mEq/L   Glucose, Bld 133 (*) 70 - 99 mg/dL   BUN 42 (*) 6 - 23 mg/dL   Creatinine, Ser 6.60 (*) 0.50 - 1.10 mg/dL   Calcium 9.3  8.4 - 63.0 mg/dL   Total Protein 6.5  6.0 - 8.3 g/dL   Albumin 2.3 (*) 3.5 - 5.2 g/dL   AST 20  0 - 37 U/L   ALT 12  0 - 35 U/L   Alkaline Phosphatase 153 (*) 39 - 117 U/L   Total Bilirubin 0.4  0.3 - 1.2 mg/dL   GFR calc non Af Amer 6 (*) >90 mL/min   GFR calc Af Amer 7 (*) >90 mL/min  PROTIME-INR      Result Value Range   Prothrombin Time 14.6  11.6 - 15.2 seconds   INR 1.16  0.00 - 1.49  OCCULT BLOOD, POC DEVICE      Result Value Range   Fecal Occult Bld POSITIVE (*) NEGATIVE   Dg Abd 1 View  01/04/2013   CLINICAL DATA:  Abdominal pain and vomiting. Status post placement of peritoneal catheter 3 days ago.  EXAM: ABDOMEN - 1 VIEW  COMPARISON:  CT abdomen and pelvis 12/05/2011.  FINDINGS: Peritoneal  dialysis catheter from a left lower quadrant approach is in place with the tip in the pelvis. There is a large volume of stool throughout the colon. No evidence of bowel obstruction is seen. No abnormal abdominal calcification is identified. Convex left scoliosis is noted.  IMPRESSION: No acute finding.  Peritoneal dialysis catheter in place without evidence of complication.  Large volume of stool throughout the colon.   Electronically Signed   By: Drusilla Kanner M.D.   On: 01/04/2013 13:43  EKG Interpretation   None       MDM  Labs.  Pt disimpacted, large stool ball removed. No gross blood.  Reviewed nursing notes and prior charts for additional history.   Daughter arrives - additional hx provided including recent fever, recent abx rx, etc.  States recent bps in 90 range at baseline.  Will add lactate to labs, given recent abx will add c diff.  Wbc elevated, lower abd pain and tenderness, will get ct. Renal called re ?sending peritoneal dialysate.   [place] called.   Recheck pt, no change from earlier exam.   Discussed pt with Dr Arlean Hopping, on call for [facility] - he indicates he will have renal nurse come and send pd fluid off for cell count. States given v high wbc on cbc, agrees w ct.   Signed out to Dr Freida Busman, to check PD fluid and ct results when back, and admit to appropriate service at that point.  Given marginal bp, elev wbc, will give dose abx in ed pending additional lab and ct results.       Suzi Roots, MD 01/04/13 [id number]

## 2013-01-04 NOTE — Progress Notes (Signed)
Dr. Arlean Hopping telephoned this RN about PD orders for this patient. Stated to collect a PD fluid cell count, drain patient entirely, and then fill with 1.5L of 1.5% dialysate. Stated he would have the PA enter orders.  Kathlene November, [place]

## 2013-01-05 ENCOUNTER — Inpatient Hospital Stay (HOSPITAL_COMMUNITY): Payer: Medicare Other

## 2013-01-05 DIAGNOSIS — H3090 Unspecified chorioretinal inflammation, unspecified eye: Secondary | ICD-10-CM

## 2013-01-05 DIAGNOSIS — R197 Diarrhea, unspecified: Secondary | ICD-10-CM | POA: Diagnosis present

## 2013-01-05 DIAGNOSIS — T8571XA Infection and inflammatory reaction due to peritoneal dialysis catheter, initial encounter: Secondary | ICD-10-CM

## 2013-01-05 DIAGNOSIS — R933 Abnormal findings on diagnostic imaging of other parts of digestive tract: Secondary | ICD-10-CM | POA: Diagnosis present

## 2013-01-05 DIAGNOSIS — N186 End stage renal disease: Secondary | ICD-10-CM

## 2013-01-05 DIAGNOSIS — K5900 Constipation, unspecified: Secondary | ICD-10-CM

## 2013-01-05 LAB — CBC
HCT: 20.8 % — ABNORMAL LOW (ref 36.0–46.0)
HCT: 24.5 % — ABNORMAL LOW (ref 36.0–46.0)
Hemoglobin: 6.8 g/dL — CL (ref 12.0–15.0)
Hemoglobin: 6.9 g/dL — CL (ref 12.0–15.0)
Hemoglobin: 8.1 g/dL — ABNORMAL LOW (ref 12.0–15.0)
MCH: 28.5 pg (ref 26.0–34.0)
MCH: 28.2 pg (ref 26.0–34.0)
MCHC: 33.3 g/dL (ref 30.0–36.0)
MCHC: 33.2 g/dL (ref 30.0–36.0)
RBC: 2.85 MIL/uL — ABNORMAL LOW (ref 3.87–5.11)
RDW: 15.9 % — ABNORMAL HIGH (ref 11.5–15.5)
WBC: 24.8 10*3/uL — ABNORMAL HIGH (ref 4.0–10.5)
WBC: 22.9 10*3/uL — ABNORMAL HIGH (ref 4.0–10.5)
WBC: 21 10*3/uL — ABNORMAL HIGH (ref 4.0–10.5)

## 2013-01-05 LAB — DIFFERENTIAL
Basophils Absolute: 0 10*3/uL (ref 0.0–0.1)
Eosinophils Absolute: 0.2 10*3/uL (ref 0.0–0.7)
Eosinophils Relative: 1 % (ref 0–5)
Lymphocytes Relative: 6 % — ABNORMAL LOW (ref 12–46)
Lymphs Abs: 1.4 10*3/uL (ref 0.7–4.0)
Monocytes Absolute: 1.1 10*3/uL — ABNORMAL HIGH (ref 0.1–1.0)

## 2013-01-05 LAB — RENAL FUNCTION PANEL
BUN: 41 mg/dL — ABNORMAL HIGH (ref 6–23)
CO2: 22 mEq/L (ref 19–32)
Calcium: 8.1 mg/dL — ABNORMAL LOW (ref 8.4–10.5)
Chloride: 89 mEq/L — ABNORMAL LOW (ref 96–112)
Creatinine, Ser: 5.52 mg/dL — ABNORMAL HIGH (ref 0.50–1.10)
GFR calc Af Amer: 8 mL/min — ABNORMAL LOW (ref >90)
GFR calc non Af Amer: 7 mL/min — ABNORMAL LOW (ref >90)
Glucose, Bld: 97 mg/dL (ref 70–99)
Phosphorus: 3.8 mg/dL (ref 2.3–4.6)
Potassium: 3.1 mEq/L — ABNORMAL LOW (ref 3.5–5.1)
Sodium: 127 mEq/L — ABNORMAL LOW (ref 135–145)

## 2013-01-05 LAB — MAGNESIUM: Magnesium: 1.5 mg/dL (ref 1.5–2.5)

## 2013-01-05 LAB — TROPONIN I
Troponin I: <0.3 ng/mL (ref <0.30)
Troponin I: <0.3 ng/mL (ref <0.30)
Troponin I: <0.3 ng/mL (ref <0.30)

## 2013-01-05 LAB — GLUCOSE, CAPILLARY
Glucose-Capillary: 102 mg/dL — ABNORMAL HIGH (ref 70–99)
Glucose-Capillary: 125 mg/dL — ABNORMAL HIGH (ref 70–99)
Glucose-Capillary: 135 mg/dL — ABNORMAL HIGH (ref 70–99)
Glucose-Capillary: 148 mg/dL — ABNORMAL HIGH (ref 70–99)

## 2013-01-05 LAB — CLOSTRIDIUM DIFFICILE BY PCR: Toxigenic C. Difficile by PCR: NEGATIVE

## 2013-01-05 LAB — LACTIC ACID, PLASMA: Lactic Acid, Venous: 2 mmol/L (ref 0.5–2.2)

## 2013-01-05 MED ORDER — SODIUM CHLORIDE 0.9 % IV BOLUS (SEPSIS)
500.0000 mL | Freq: Once | INTRAVENOUS | Status: AC
  Administered 2013-01-05: 500 mL via INTRAVENOUS

## 2013-01-05 MED ORDER — DELFLEX-LC/1.5% DEXTROSE 346 MOSM/L IP SOLN: INTRAPERITONEAL | Status: DC

## 2013-01-05 MED ORDER — POTASSIUM CHLORIDE CRYS ER 20 MEQ PO TBCR
40.0000 meq | EXTENDED_RELEASE_TABLET | Freq: Once | ORAL | Status: AC
  Administered 2013-01-05: 40 meq via ORAL
  Filled 2013-01-05: qty 2

## 2013-01-05 MED ORDER — ONDANSETRON HCL 4 MG PO TABS: 4.0000 mg | ORAL_TABLET | Freq: Three times a day (TID) | ORAL | Status: DC | PRN

## 2013-01-05 MED ORDER — SORBITOL 70 % SOLN
960.0000 mL | TOPICAL_OIL | Freq: Once | ORAL | Status: AC
  Administered 2013-01-05: 960 mL via RECTAL
  Filled 2013-01-05: qty 240

## 2013-01-05 MED ORDER — ONDANSETRON HCL 4 MG/2ML IJ SOLN
4.0000 mg | Freq: Three times a day (TID) | INTRAMUSCULAR | Status: DC | PRN
  Administered 2013-01-05: 4 mg via INTRAVENOUS
  Filled 2013-01-05: qty 2

## 2013-01-05 MED ORDER — SODIUM CHLORIDE 0.9 % IV SOLN
INTRAVENOUS | Status: DC
  Administered 2013-01-05: 65 mL/h via INTRAVENOUS
  Administered 2013-01-06: 1000 mL via INTRAVENOUS
  Administered 2013-01-06 – 2013-01-07 (×2): via INTRAVENOUS

## 2013-01-05 MED ORDER — BOOST PLUS PO LIQD
237.0000 mL | Freq: Two times a day (BID) | ORAL | Status: DC
  Administered 2013-01-05 – 2013-01-08 (×3): 237 mL via ORAL
  Filled 2013-01-05 (×12): qty 237

## 2013-01-05 MED ORDER — PANTOPRAZOLE SODIUM 40 MG IV SOLR
40.0000 mg | Freq: Once | INTRAVENOUS | Status: DC
  Filled 2013-01-05: qty 40

## 2013-01-05 MED ORDER — LIDOCAINE HCL 2 % EX GEL
Freq: Three times a day (TID) | CUTANEOUS | Status: DC
  Administered 2013-01-05 – 2013-01-08 (×8): 5 via TOPICAL
  Administered 2013-01-08: 10:00:00 via TOPICAL
  Filled 2013-01-05 (×26): qty 5

## 2013-01-05 MED ORDER — HYDROCORTISONE ACETATE 25 MG RE SUPP
25.0000 mg | Freq: Two times a day (BID) | RECTAL | Status: DC
Start: 2013-01-05 — End: 2013-01-09
  Administered 2013-01-05 – 2013-01-08 (×6): 25 mg via RECTAL
  Filled 2013-01-05 (×9): qty 1

## 2013-01-05 MED ORDER — SODIUM CHLORIDE 0.9 % IV SOLN
INTRAVENOUS | Status: DC
  Administered 2013-01-05: 02:00:00 via INTRAVENOUS

## 2013-01-05 MED ORDER — DARBEPOETIN ALFA-POLYSORBATE 100 MCG/0.5ML IJ SOLN
100.0000 ug | INTRAMUSCULAR | Status: DC
  Administered 2013-01-05: 100 ug via SUBCUTANEOUS
  Filled 2013-01-05: qty 0.5

## 2013-01-05 NOTE — Progress Notes (Signed)
IV infiltrated, cannot find vein.  Blood back to blood bank.  IV team called awaiting call back.  Continue to watch.

## 2013-01-05 NOTE — Progress Notes (Signed)
Pt admitted into 3S06. VSS. Belongings sent home with daughter in law. CCMD and ELINK notified. 6E charge RN contacted about peritoneal dialysis. Will continue to monitor.

## 2013-01-05 NOTE — Consult Note (Signed)
[facility]    Date of Admission:  01/04/2013  Date of Consult:  01/05/2013  Reason for Consult: Peritonitis associated with peritoneal dialysis Referring Physician: Dr. Kem Kays   HPI: Valerie Yoder is an 79 y.o. female with past medical history significant for mitral valve prolapse, hypertension congestive heart failure end-stage renal disease on peritoneal dialysis that has been complicated by several peritoneal balances infections. Per Dr. Malena Catholic note she had bacillus isolated on culture from September PD fluid that was never speciated and was rx with IP vancomycin  She was admitted fever abdominal pain after few days also with problems with severe constipation.  Her peritoneal dialysis fluid was aspirated and yielded 1000 white blood cells with 64% neutrophils. No organisms were seen on Gram stain of the isolate and cultures have been negative at one day.  She is currently on levofloxacin and vancomycin   Past Medical History  Diagnosis Date  . Hypertension   . Diabetes mellitus   . Renal failure   . Mitral valve prolapse   . Hyperlipidemia   . CHF (congestive heart failure)   . Atrial fibrillation   . Arthritis   . Cancer 07/02/11    Left face    Past Surgical History  Procedure Laterality Date  . Nephrectomy      right kidney  . Abdominal hysterectomy    . Appendectomy    . Tonsillectomy    . Av fistula placement  2010    left brachiocephalic AVF  . Peritoneal catheter insertion  2011  ergies:   Allergies  Allergen Reactions  . Latex   . Other     Pneumonia shot   . Penicillins   . Sulfa Antibiotics      Medications: I have reviewed patients current medications as documented in Epic Anti-infectives   Start     Dose/Rate Route Frequency Ordered Stop   01/06/13 2000  levofloxacin (LEVAQUIN) IVPB 500 mg     500 mg 100 mL/hr over 60 Minutes Intravenous Every 48 hours 01/04/13 1710     01/04/13 1715  levofloxacin  (LEVAQUIN) IVPB 750 mg     750 mg 100 mL/hr over 90 Minutes Intravenous  Once 01/04/13 1706 01/04/13 2237   01/04/13 1715  vancomycin (VANCOCIN) 1,500 mg in sodium chloride 0.9 % 500 mL IVPB     1,500 mg 250 mL/hr over 120 Minutes Intravenous  Once 01/04/13 1706 01/04/13 2021      Social History:  reports that she has never smoked. She does not have any smokeless tobacco history on file. She reports that she does not drink alcohol or use illicit drugs.  History reviewed. No pertinent family history.  As in HPI and primary teams notes otherwise 12 point review of systems is negative  Blood pressure 88/44, pulse 67, temperature 98.2 F (36.8 C), temperature source Oral, resp. rate 12, height 5\' 3"  (1.6 m), weight 119 lb 14.9 oz (54.4 kg), SpO2 100.00%.  General: Alert and awake, oriented x3, not in any acute distress. HEENT: anicteric sclera, pupils reactive to light and accommodation, EOMI, oropharynx clear and without exudate CVS regular rate, normal r,  no murmur rubs or gallops Chest: clear to auscultation bilaterally, no wheezing, rales or rhonchi Abdomen: soft only tender, distended perineal dialysis catheter in place \ Extremities: 2+ edema Skin: no rashes Neuro: nonfocal, strength and sensation intact   Results for orders placed during the hospital encounter of 01/04/13 (from the past 48 hour(s))  TROPONIN  I     Status: None   Collection Time    01/04/13 12:13 AM      Result Value Range   Troponin I <0.30  <0.30 ng/mL   Comment:            Due to the release kinetics of cTnI,     a negative result within the first hours     of the onset of symptoms does not rule out     myocardial infarction with certainty.     If myocardial infarction is still suspected,     repeat the test at appropriate intervals.  CBC WITH DIFFERENTIAL     Status: Abnormal   Collection Time    01/04/13  1:11 PM      Result Value Range   WBC 34.9 (*) 4.0 - 10.5 K/uL   RBC 3.16 (*) 3.87 - 5.11  MIL/uL   Hemoglobin 9.0 (*) 12.0 - 15.0 g/dL   HCT 95.6 (*) 21.3 - 08.6 %   MCV 85.4  78.0 - 100.0 fL   MCH 28.5  26.0 - 34.0 pg   MCHC 33.3  30.0 - 36.0 g/dL   RDW 57.8 (*) 46.9 - 62.9 %   Platelets 404 (*) 150 - 400 K/uL   Neutrophils Relative % 89 (*) 43 - 77 %   Neutro Abs 30.9 (*) 1.7 - 7.7 K/uL   Lymphocytes Relative 7 (*) 12 - 46 %   Lymphs Abs 2.3  0.7 - 4.0 K/uL   Monocytes Relative 5  3 - 12 %   Monocytes Absolute 1.6 (*) 0.1 - 1.0 K/uL   Eosinophils Relative 0  0 - 5 %   Eosinophils Absolute 0.1  0.0 - 0.7 K/uL   Basophils Relative 0  0 - 1 %   Basophils Absolute 0.0  0.0 - 0.1 K/uL  COMPREHENSIVE METABOLIC PANEL     Status: Abnormal   Collection Time    01/04/13  1:11 PM      Result Value Range   Sodium 131 (*) 135 - 145 mEq/L   Potassium 2.9 (*) 3.5 - 5.1 mEq/L   Chloride 87 (*) 96 - 112 mEq/L   CO2 26  19 - 32 mEq/L   Glucose, Bld 133 (*) 70 - 99 mg/dL   BUN 42 (*) 6 - 23 mg/dL   Creatinine, Ser 5.28 (*) 0.50 - 1.10 mg/dL   Calcium 9.3  8.4 - 41.3 mg/dL   Total Protein 6.5  6.0 - 8.3 g/dL   Albumin 2.3 (*) 3.5 - 5.2 g/dL   AST 20  0 - 37 U/L   ALT 12  0 - 35 U/L   Alkaline Phosphatase 153 (*) 39 - 117 U/L   Total Bilirubin 0.4  0.3 - 1.2 mg/dL   GFR calc non Af Amer 6 (*) >90 mL/min   GFR calc Af Amer 7 (*) >90 mL/min   Comment: (NOTE)     The eGFR has been calculated using the CKD EPI equation.     This calculation has not been validated in all clinical situations.     eGFR's persistently <90 mL/min signify possible Chronic Kidney     Disease.  PROTIME-INR     Status: None   Collection Time    01/04/13  1:11 PM      Result Value Range   Prothrombin Time 14.6  11.6 - 15.2 seconds   INR 1.16  0.00 - 1.49  OCCULT BLOOD, POC DEVICE  Status: Abnormal   Collection Time    01/04/13  1:11 PM      Result Value Range   Fecal Occult Bld POSITIVE (*) NEGATIVE  LIPASE, BLOOD     Status: None   Collection Time    01/04/13  1:11 PM      Result Value Range     Lipase 17  11 - 59 U/L  LACTIC ACID, PLASMA     Status: Abnormal   Collection Time    01/04/13  3:10 PM      Result Value Range   Lactic Acid, Venous 2.6 (*) 0.5 - 2.2 mmol/L  BODY FLUID CELL COUNT WITH DIFFERENTIAL     Status: Abnormal   Collection Time    01/04/13  4:12 PM      Result Value Range   Fluid Type-FCT PERITONEAL CAVITY     Comment: CORRECTED ON 11/20 AT 1639: PREVIOUSLY REPORTED AS Body Fluid   Color, Fluid STRAW (*) YELLOW   Appearance, Fluid HAZY (*) CLEAR   WBC, Fluid 1014 (*) 0 - 1000 cu mm   Neutrophil Count, Fluid 64 (*) 0 - 25 %   Lymphs, Fluid 2     Monocyte-Macrophage-Serous Fluid 33 (*) 50 - 90 %   Eos, Fluid 1    BODY FLUID CULTURE     Status: None   Collection Time    01/04/13  4:12 PM      Result Value Range   Specimen Description PERITONEAL DIALYSATE     Special Requests NONE     Gram Stain       Value: NO WBC SEEN     NO ORGANISMS SEEN     Performed at [facility]   Culture PENDING     Report Status PENDING    LACTATE DEHYDROGENASE, BODY FLUID     Status: Abnormal   Collection Time    01/04/13  4:12 PM      Result Value Range   LD, Fluid 41 (*) 3 - 23 U/L   Fluid Type-FLDH PERITONEAL DIALYSATE     Comment: CORRECTED ON 11/20 AT 1958: PREVIOUSLY REPORTED AS PERITONEAL CAVITY  AMYLASE, BODY FLUID     Status: None   Collection Time    01/04/13  4:12 PM      Result Value Range   Amylase, Fluid 9     Comment: NO NORMAL RANGE ESTABLISHED FOR THIS TEST   Fluid Type-FAMY PERITONEAL DIALYSATE     Comment: CORRECTED ON 11/20 AT 1958: PREVIOUSLY REPORTED AS PERITONEAL CAVITY  GLUCOSE, SEROUS FLUID     Status: None   Collection Time    01/04/13  4:12 PM      Result Value Range   Glucose, Fluid 194     Comment:            FLUID GLUCOSE LEVELS OF <60     mg/dL OR VALUES OF 40 mg/dL     LESS THAN A SIMULTANEOUS     SERUM LEVEL ARE CONSIDERED     DECREASED.   Fluid Type-FGLU PERITONEAL DIALYSATE     Comment: FLUID     CORRECTED ON  11/20 AT 1959: PREVIOUSLY REPORTED AS PERITONEAL CAVITY  CULTURE, BLOOD (ROUTINE X 2)     Status: None   Collection Time    01/04/13  6:05 PM      Result Value Range   Specimen Description BLOOD HAND RIGHT     Special Requests BOTTLES DRAWN AEROBIC ONLY 3CC  Culture  Setup Time       Value: 01/04/2013 22:14     Performed at [facility]   Culture       Value:        BLOOD CULTURE RECEIVED NO GROWTH TO DATE CULTURE WILL BE HELD FOR 5 DAYS BEFORE ISSUING A FINAL NEGATIVE REPORT     Performed at [facility]   Report Status PENDING    GLUCOSE, CAPILLARY     Status: Abnormal   Collection Time    01/04/13  9:42 PM      Result Value Range   Glucose-Capillary 102 (*) 70 - 99 mg/dL   Comment 1 Notify RN     Comment 2 Documented in Chart    MRSA PCR SCREENING     Status: None   Collection Time    01/04/13 10:12 PM      Result Value Range   MRSA by PCR NEGATIVE  NEGATIVE   Comment:            The GeneXpert MRSA Assay (FDA     approved for NASAL specimens     only), is one component of a     comprehensive MRSA colonization     surveillance program. It is not     intended to diagnose MRSA     infection nor to guide or     monitor treatment for     MRSA infections.  CLOSTRIDIUM DIFFICILE BY PCR     Status: None   Collection Time    01/05/13  2:52 AM      Result Value Range   C difficile by pcr NEGATIVE  NEGATIVE  GLUCOSE, CAPILLARY     Status: Abnormal   Collection Time    01/05/13  3:46 AM      Result Value Range   Glucose-Capillary 125 (*) 70 - 99 mg/dL  RENAL FUNCTION PANEL     Status: Abnormal   Collection Time    01/05/13  4:34 AM      Result Value Range   Sodium 127 (*) 135 - 145 mEq/L   Potassium 3.1 (*) 3.5 - 5.1 mEq/L   Chloride 89 (*) 96 - 112 mEq/L   CO2 22  19 - 32 mEq/L   Glucose, Bld 97  70 - 99 mg/dL   BUN 41 (*) 6 - 23 mg/dL   Creatinine, Ser 1.61 (*) 0.50 - 1.10 mg/dL   Calcium 8.1 (*) 8.4 - 10.5 mg/dL   Phosphorus 3.8  2.3 - 4.6 mg/dL     Albumin 1.5 (*) 3.5 - 5.2 g/dL   GFR calc non Af Amer 7 (*) >90 mL/min   GFR calc Af Amer 8 (*) >90 mL/min   Comment: (NOTE)     The eGFR has been calculated using the CKD EPI equation.     This calculation has not been validated in all clinical situations.     eGFR's persistently <90 mL/min signify possible Chronic Kidney     Disease.  CBC     Status: Abnormal   Collection Time    01/05/13  4:34 AM      Result Value Range   WBC 24.8 (*) 4.0 - 10.5 K/uL   RBC 2.39 (*) 3.87 - 5.11 MIL/uL   Hemoglobin 6.8 (*) 12.0 - 15.0 g/dL   Comment: CRITICAL RESULT CALLED TO, READ BACK BY AND VERIFIED WITH:     USSERY,K RN 01/05/2013 0544 JORDANS     REPEATED TO VERIFY  HCT 20.4 (*) 36.0 - 46.0 %   MCV 85.4  78.0 - 100.0 fL   MCH 28.5  26.0 - 34.0 pg   MCHC 33.3  30.0 - 36.0 g/dL   RDW 40.9 (*) 81.1 - 91.4 %   Platelets 345  150 - 400 K/uL  DIFFERENTIAL     Status: Abnormal   Collection Time    01/05/13  4:34 AM      Result Value Range   Neutrophils Relative % 89 (*) 43 - 77 %   Neutro Abs 22.0 (*) 1.7 - 7.7 K/uL   Lymphocytes Relative 6 (*) 12 - 46 %   Lymphs Abs 1.4  0.7 - 4.0 K/uL   Monocytes Relative 5  3 - 12 %   Monocytes Absolute 1.1 (*) 0.1 - 1.0 K/uL   Eosinophils Relative 1  0 - 5 %   Eosinophils Absolute 0.2  0.0 - 0.7 K/uL   Basophils Relative 0  0 - 1 %   Basophils Absolute 0.0  0.0 - 0.1 K/uL  TROPONIN I     Status: None   Collection Time    01/05/13  4:34 AM      Result Value Range   Troponin I <0.30  <0.30 ng/mL   Comment:            Due to the release kinetics of cTnI,     a negative result within the first hours     of the onset of symptoms does not rule out     myocardial infarction with certainty.     If myocardial infarction is still suspected,     repeat the test at appropriate intervals.  MAGNESIUM     Status: None   Collection Time    01/05/13  4:34 AM      Result Value Range   Magnesium 1.5  1.5 - 2.5 mg/dL  TYPE AND SCREEN     Status: None    Collection Time    01/05/13  7:20 AM      Result Value Range   ABO/RH(D) O NEG     Antibody Screen NEG     Sample Expiration 01/08/2013     Unit Number [id number]     Blood Component Type RED CELLS,LR     Unit division 00     Status of Unit ALLOCATED     Transfusion Status OK TO TRANSFUSE     Crossmatch Result Compatible     Unit Number [id number]     Blood Component Type RBC LR PHER1     Unit division 00     Status of Unit ALLOCATED     Transfusion Status OK TO TRANSFUSE     Crossmatch Result Compatible    CBC     Status: Abnormal   Collection Time    01/05/13  7:20 AM      Result Value Range   WBC 22.9 (*) 4.0 - 10.5 K/uL   RBC 2.45 (*) 3.87 - 5.11 MIL/uL   Hemoglobin 6.9 (*) 12.0 - 15.0 g/dL   Comment: CRITICAL VALUE NOTED.  VALUE IS CONSISTENT WITH PREVIOUSLY REPORTED AND CALLED VALUE.   HCT 20.8 (*) 36.0 - 46.0 %   MCV 84.9  78.0 - 100.0 fL   MCH 28.2  26.0 - 34.0 pg   MCHC 33.2  30.0 - 36.0 g/dL   RDW 32.4 (*) 40.1 - 02.7 %   Platelets 337  150 - 400 K/uL  PREPARE RBC (CROSSMATCH)  Status: None   Collection Time    01/05/13  7:20 AM      Result Value Range   Order Confirmation ORDER PROCESSED BY BLOOD BANK    ABO/RH     Status: None   Collection Time    01/05/13  7:20 AM      Result Value Range   ABO/RH(D) O NEG    PREPARE RBC (CROSSMATCH)     Status: None   Collection Time    01/05/13  7:20 AM      Result Value Range   Order Confirmation ORDER PROCESSED BY BLOOD BANK    GLUCOSE, CAPILLARY     Status: Abnormal   Collection Time    01/05/13  7:49 AM      Result Value Range   Glucose-Capillary 135 (*) 70 - 99 mg/dL   Comment 1 Notify RN     Comment 2 Documented in Chart    LACTIC ACID, PLASMA     Status: None   Collection Time    01/05/13  8:47 AM      Result Value Range   Lactic Acid, Venous 2.0  0.5 - 2.2 mmol/L  TROPONIN I     Status: None   Collection Time    01/05/13 12:00 PM      Result Value Range   Troponin I <0.30  <0.30 ng/mL    Comment:            Due to the release kinetics of cTnI,     a negative result within the first hours     of the onset of symptoms does not rule out     myocardial infarction with certainty.     If myocardial infarction is still suspected,     repeat the test at appropriate intervals.  GLUCOSE, CAPILLARY     Status: Abnormal   Collection Time    01/05/13 12:00 PM      Result Value Range   Glucose-Capillary 148 (*) 70 - 99 mg/dL   Comment 1 Documented in Chart     Comment 2 Notify RN        Component Value Date/Time   SDES BLOOD HAND RIGHT 01/04/2013 1805   SPECREQUEST BOTTLES DRAWN AEROBIC ONLY 3CC 01/04/2013 1805   CULT  Value:        BLOOD CULTURE RECEIVED NO GROWTH TO DATE CULTURE WILL BE HELD FOR 5 DAYS BEFORE ISSUING A FINAL NEGATIVE REPORT Performed at [facility] 01/04/2013 1805   REPTSTATUS PENDING 01/04/2013 1805   Dg Abd 1 View  01/04/2013   CLINICAL DATA:  Abdominal pain and vomiting. Status post placement of peritoneal catheter 3 days ago.  EXAM: ABDOMEN - 1 VIEW  COMPARISON:  CT abdomen and pelvis 12/05/2011.  FINDINGS: Peritoneal dialysis catheter from a left lower quadrant approach is in place with the tip in the pelvis. There is a large volume of stool throughout the colon. No evidence of bowel obstruction is seen. No abnormal abdominal calcification is identified. Convex left scoliosis is noted.  IMPRESSION: No acute finding.  Peritoneal dialysis catheter in place without evidence of complication.  Large volume of stool throughout the colon.   Electronically Signed   By: Drusilla Kanner M.D.   On: 01/04/2013 13:43   Ct Abdomen Pelvis W Contrast  01/04/2013   CLINICAL DATA:  Abdominal pain. Constipation and now diarrhea, fever, rectal pain and bleeding.  EXAM: CT ABDOMEN AND PELVIS WITH CONTRAST  TECHNIQUE: Multidetector CT imaging of the  abdomen and pelvis was performed using the standard protocol following bolus administration of intravenous contrast.   CONTRAST:  OMNIPAQUE IOHEXOL 300 MG/ML  SOLN  COMPARISON:  12/05/2011  FINDINGS: The lung bases demonstrate by lateral linear density likely atelectasis with small amount of right pleural fluid. Coronary artery calcifications are present. There is mild cardiomegaly.  Abdominal images demonstrate evidence of patient's peritoneal dialysis catheter entering the left lower quadrant with tip over the pelvis just left of midline without significant change. There is mild ascites present likely due to patient's peritoneal dialysis slightly more pronounced compared to the prior exam. The liver, spleen, pancreas, gallbladder and adrenal glands are within normal. There is absence of the right kidney. The left kidney demonstrates a well-defined 1.9 cm homogeneously hypodense exophytic mass over the upper pole with Hounsfield unit measurements of 24 unchanged in size since 2012 and likely a slightly hyperdense cyst. There is continued evidence of a 2.3 cm solid and fatty mass over the superior pole of the left kidney as this is unchanged in size compared to 2012, although demonstrates slightly less fat component. Appendix is not definitely seen. There is moderate calcified atherosclerotic plaque of the abdominal aorta iliac vessels.  There is no evidence of bowel obstruction. Air is present throughout the colon. There are a few small flecks of free air over the anterior aspect of the left abdomen just above the peritoneal dialysis catheter entry point without change from the prior exams.  Pelvic images demonstrate suggestion mild wall thickening over the distal rectal region. There is no change in mild compression deformity of T12 as there is also a mild compression deformity of T9 age-indeterminate.  IMPRESSION: Mild wall thickening over the distal rectum which may be due to proctitis.  Left-sided peritoneal dialysis catheter with tip over the pelvis just left of midline without significant change. Ascites slightly worse.   Mild bibasilar atelectatic change and small amount of right pleural fluid.  Probable hyperdense cyst over the upper pole of the left kidney unchanged from 2012. 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 Recommend followup CT in 6 months.  Stable T12 compression fracture with mild T9 compression fracture age indeterminate.   Electronically Signed   By: Elberta Fortis M.D.   On: 01/04/2013 17:54     Recent Results (from the past 720 hour(s))  BODY FLUID CULTURE     Status: None   Collection Time    01/04/13  4:12 PM      Result Value Range Status   Specimen Description PERITONEAL DIALYSATE   Final   Special Requests NONE   Final   Gram Stain     Final   Value: NO WBC SEEN     NO ORGANISMS SEEN     Performed at [facility]   Culture PENDING   Incomplete   Report Status PENDING   Incomplete  CULTURE, BLOOD (ROUTINE X 2)     Status: None   Collection Time    01/04/13  6:05 PM      Result Value Range Status   Specimen Description BLOOD HAND RIGHT   Final   Special Requests BOTTLES DRAWN AEROBIC ONLY 3CC   Final   Culture  Setup Time     Final  Value: 01/04/2013 22:14     Performed at [facility]   Culture     Final   Value:        BLOOD CULTURE RECEIVED NO GROWTH TO DATE CULTURE WILL BE HELD FOR 5 DAYS BEFORE ISSUING A FINAL NEGATIVE REPORT     Performed at [facility]   Report Status PENDING   Incomplete  MRSA PCR SCREENING     Status: None   Collection Time    01/04/13 10:12 PM      Result Value Range Status   MRSA by PCR NEGATIVE  NEGATIVE Final   Comment:            The GeneXpert MRSA Assay (FDA     approved for NASAL specimens     only), is one component of a     comprehensive MRSA colonization     surveillance program. It is not     intended to  diagnose MRSA     infection nor to guide or     monitor treatment for     MRSA infections.  CLOSTRIDIUM DIFFICILE BY PCR     Status: None   Collection Time    01/05/13  2:52 AM      Result Value Range Status   C difficile by pcr NEGATIVE  NEGATIVE Final     Impression/Recommendation  79 year old lady with end-stage renal disease getting peritoneal dialysis with prior peritoneal dialysis infections including one with a bacillus species in September. Now admitted with another peritoneal dialysis associated peritonitis.  #1 peritoneal dialysis associated retinitis: This is presumably bacterial.  I agree with vancomycin and levofloxacin given her penicillin allergy.  Followup cultures.  A. she continues to grow the same bacterial organism or she were to grow a yeast from culture she would need removal of her peritoneal dialysis catheter.     Thank you so much for this interesting consult  [facility] [phone] (pager) [phone] (office) 01/05/2013, 2:01 PM  Paulette Blanch [place] 01/05/2013, 2:01 PM

## 2013-01-05 NOTE — H&P (Signed)
INTERNAL MEDICINE TEACHING SERVICE Attending Admission Note  Date: 01/05/2013  Patient name: Valerie Yoder  Medical record number: [id number]  Date of birth: [birth year]    I have seen and evaluated [name] and discussed their care with the Residency Team.  79 yr old female with hx ESRD on peritoneal dialysis, HTN, Type 2 DM, Afib, presented with abdominal pain and fever. She states she has had significant constipation recently and attempted to disimpact herself.  She noted a fever of up to 102 F at home.  She was noted to have SBP in 80 range in the ED as well as a leukocytosis of 34k, K of 2.9, and lactic acid of 2.9. Her peritoneal fluid was sent for cell count, with WBC of 1014 (64% Neutrophils).   On exam, she has no abdominal tenderness. He PD catheter looks clean. But, given the cell counts, I would initially cover for peritonitis related to PD. For now, give Vanc and Cipro/Lavaquin. Send PF for culture. Send BC.  I would have ID evaluate her as well. Of note, she has a hx of peritonitis in the past treated with IV vancomycin.  Would watch her closely, give IVF boluses with 500 cc NS for volume resuscitation given BP and recheck after each bolus. Another concern is the significant constipation with need to disimpact. CT with proctitis. C diff is negative, but continues to have diarrhea. She is noted to have +FOBT and recent hematochezia per hx. Would have GI input in her case. Follow CBC twice daily.   Jonah Blue, DO, FACP Faculty [facility] Internal Medicine Residency Program 01/05/2013, 12:33 PM

## 2013-01-05 NOTE — Procedures (Signed)
Central Venous Catheter Insertion Procedure Note Valerie Yoder [id number] [birth year]  Procedure: Insertion of Central Venous Catheter Indications: Drug and/or fluid administration  Procedure Details Consent: Risks of procedure as well as the alternatives and risks of each were explained to the (patient/caregiver).  Consent for procedure obtained. Time Out: Verified patient identification, verified procedure, site/side was marked, verified correct patient position, special equipment/implants available, medications/allergies/relevent history reviewed, required imaging and test results available.  Performed  Maximum sterile technique was used including antiseptics, cap, gloves, gown, hand hygiene, mask and sheet. Skin prep: Chlorhexidine; local anesthetic administered A antimicrobial bonded/coated triple lumen catheter was placed in the left internal jugular vein using the Seldinger technique.  Evaluation Blood flow good Complications: No apparent complications Patient did tolerate procedure well. Chest X-ray ordered to verify placement.  CXR: pending.  U/S used in placement.  YACOUB,WESAM 01/05/2013, 1:38 PM

## 2013-01-05 NOTE — Consult Note (Signed)
Renal Service Consult Note [facility]  Valerie Yoder 01/05/2013 Valerie Yoder Requesting Physician:  Dr Kem Kays  Reason for Consult:  ESRD pt with abd pain and fever HPI: The patient is a 79 y.o. year-old with hx of HTN, DM , afib and ESRD on peritoneal dialysis. Patient came to ED with c/o constipation, loose stools and pain in rectal area.  Fevers at home were up to 102deg F.  She had attempted self-disimpaction also at home a few days prior.  In the ED BP was low in the 80's, temp normal, WBC 24k, Na 127, albumin 1.5, and Hb 6.8.  She got one unit of prbc's overnight. Cell count on PD fluid sent was high at 1014, 64% pmns.  Gram stain showed no organisms and no wbc's.  Blood culture was drawn and is neg at 24 hrs today. Cdif was done and is negative. CT abd showed some mild proctitis but no abcess or other bowel related abnormalities. Abd films showed large volume stool in colon and good placement of PD cath.    Started PD in 2010, has had peritonitis 3-4x per patient . Most recent episode was Sept 26 when she grew a "gram +bacillus" identified only as Bacillus species, no further testing was done as usual with Bacillus.  She was treated with IP vanc and cell count which was 7478 at onset of infection improved down to "0". I spoke with PD nurse and [facility] labs about this infection.   ROS  no f/c/s  no abd pain now  + rectal pain and oozing of stool  no cough, cp, sob  no HA or confusion  Past Medical History  Past Medical History  Diagnosis Date  . Hypertension   . Diabetes mellitus   . Renal failure   . Mitral valve prolapse   . Hyperlipidemia   . CHF (congestive heart failure)   . Atrial fibrillation   . Arthritis   . Cancer 07/02/11    Left face   Past Surgical History  Past Surgical History  Procedure Laterality Date  . Nephrectomy      right kidney  . Abdominal hysterectomy    . Appendectomy    . Tonsillectomy    . Av fistula placement  2010   left brachiocephalic AVF  . Peritoneal catheter insertion  2011   Family History History reviewed. No pertinent family history. Social History  reports that she has never smoked. She does not have any smokeless tobacco history on file. She reports that she does not drink alcohol or use illicit drugs. Allergies  Allergies  Allergen Reactions  . Latex   . Other     Pneumonia shot   . Penicillins   . Sulfa Antibiotics    Home medications Prior to Admission medications   Medication Sig Start Date End Date Taking? Authorizing Provider  acetaminophen (TYLENOL) 500 MG tablet Take 1,000 mg by mouth daily as needed for fever.   Yes Historical Provider, MD  amiodarone (PACERONE) 200 MG tablet Take 100 mg by mouth daily. Take 1 tablet once daily for 7 days, then take 1/2 tablet daily   Yes Historical Provider, MD  aspirin 81 MG tablet Take 81 mg by mouth daily.   Yes Historical Provider, MD  azithromycin (ZITHROMAX) 250 MG tablet Take 250 mg by mouth daily. Take 2 tablets the first day, then take 1 tablet daily for 4 days.   Yes Historical Provider, MD  clopidogrel (PLAVIX) 75 MG tablet Take 75  mg by mouth daily.   Yes Historical Provider, MD  diclofenac sodium (VOLTAREN) 1 % GEL Apply 2 g topically 4 (four) times daily.   Yes Historical Provider, MD  docusate sodium (COLACE) 100 MG capsule Take 100 mg by mouth 2 (two) times daily.   Yes Historical Provider, MD  gabapentin (NEURONTIN) 100 MG capsule Take 100 mg by mouth 2 (two) times daily.   Yes Historical Provider, MD  levothyroxine (SYNTHROID, LEVOTHROID) 50 MCG tablet Take 50 mcg by mouth daily.    Yes Historical Provider, MD  lidocaine (LIDODERM) 5 % Place 1 patch onto the skin daily. Remove & Discard patch within 12 hours or as directed by MD   Yes Historical Provider, MD  lovastatin (MEVACOR) 20 MG tablet Take 20 mg by mouth at bedtime.    Yes Historical Provider, MD  mirtazapine (REMERON) 15 MG tablet Take 15 mg by mouth at bedtime.   Yes  Historical Provider, MD  omeprazole (PRILOSEC) 20 MG capsule Take 20 mg by mouth 2 (two) times daily before a meal.   Yes Historical Provider, MD  rOPINIRole (REQUIP) 0.25 MG tablet Take 0.25 mg by mouth daily.   Yes Historical Provider, MD  sevelamer carbonate (RENVELA) 800 MG tablet Take 2,400 mg by mouth 3 (three) times daily with meals.   Yes Historical Provider, MD  lactulose (CHRONULAC) 10 GM/15ML solution Take 10 g by mouth every 2 (two) hours as needed. Until diarrhea    Historical Provider, MD   Liver Function Tests  Recent Labs Lab 01/04/13 1311 01/05/13 0434  AST 20  --   ALT 12  --   ALKPHOS 153*  --   BILITOT 0.4  --   PROT 6.5  --   ALBUMIN 2.3* 1.5*    Recent Labs Lab 01/04/13 1311  LIPASE 17   CBC  Recent Labs Lab 01/04/13 1311 01/05/13 0434 01/05/13 0720  WBC 34.9* 24.8* 22.9*  NEUTROABS 30.9* 22.0*  --   HGB 9.0* 6.8* 6.9*  HCT 27.0* 20.4* 20.8*  MCV 85.4 85.4 84.9  PLT 404* 345 337   Basic Metabolic Panel  Recent Labs Lab 01/04/13 1311 01/05/13 0434  NA 131* 127*  K 2.9* 3.1*  CL 87* 89*  CO2 26 22  GLUCOSE 133* 97  BUN 42* 41*  CREATININE 5.75* 5.52*  CALCIUM 9.3 8.1*  PHOS  --  3.8    Exam  Blood pressure 88/44, pulse 67, temperature 98 F (36.7 C), temperature source Oral, resp. rate 12, height 5\' 3"  (1.6 m), weight 54.4 kg (119 lb 14.9 oz), SpO2 100.00%.  gen: elderly WF in no distress, somewhat frail  skin: no rash, cyanosis  heent: eomi, sclera anicteric, throat clear  neck: no jvd, flat neck veins  chest: clear bilat, no rales  cor: regular, no M or rub  abd: soft, obese, nontender, PD cath exit site is clean  ext: no leg or UE edema , +muscle wasting, no gangrene/ulcers  neuro: alert, ox3, nonfoca  access: PD cath as above  Dialysis orders: [facility] [place]  Hangs 5L 1.5% and 5L 2.5%, does pause then 3 overnight cycles of and leaves a ady bag of also.  5 cycles total  Assessment/Plan: 1. Peritonitis,  in setting of [facility] patient with Bacillus peritonitis back in September treated with IV vanc successfully. BP's low but no other signs of sepsis, suspect she is vol depleted and will give some volume IV.  Would treat this as cath-related peritonitis; the  cell count in pd fluid is high and there is not much else on abd CT to suggest another cause. Agree with iv vanc and levaquin (allergic to pcn's) for now, await culture results, get daily cell count 2. ESRD- will do CAPD 5x/day with low dextrose concentration 3. Hypotension / vol depletion: will give saline boluses today to see if this helps 4. Anemia of ckd- s/p prbc's, give darbe 100 today sq , check fe stores 5. MBD (metabolic bone disease)- phos 3.8, Ca 8.1, both are in range, cont renvela with meals 6. Constipation: will leave to primary team, avoid Mg containing laxatives and don't use Fleet's (phos containing) enemas, otherwise can use any other agents (dulcolax, miralax, lactulose, any other type of enema); will defer to primary team specific rx    Vinson Moselle MD  pager [phone]    cell [phone]  01/05/2013, 10:13 AM

## 2013-01-05 NOTE — Consult Note (Signed)
Consultation  Referring Provider:      Primary Care Physician:  Gaye Alken, NP Primary Gastroenterologist:   Dr. Crecencio Mc in [place] (EGD Reason for Consultation:  Rectal pain            HPI:   Valerie Yoder is a 79 y.o. female with multiple medical problems including ESRD on PD. Patient admitted yesterday with hypotension, abdominal pain, fever, rectal pain and bleeding. Patient saw PCP on Monday with a 3 day history of constipation. She was given some "packets" of medicine to take, ultimately attempted to manually disimpact herself and subsequently began having rectal bleeding. She is incontinent of loose stools, a new problem for her. Presenting labs pertinent for WBC was 34K and electrolyte abnormalities.  Contrast abd / pelvis CTscan reveals mild wall thickening over distal rectum. She has a history of peritonitis. Nephrology is following and suspects recurrent peritonitis. Patient has anemia of chronic disease but hgb down to 6.9 which is about 3 grams from baseline. Per daughter stools have been very dark over last couple of days. Patient takes plavix and a daily baby asa. She is on a daily PPI. It sounds like she had an EGD in [place] 1-2 years ago with ? findings of early ulcer.   Past Medical History  Diagnosis Date  . Hypertension   . Diabetes mellitus   . Renal failure   . Mitral valve prolapse   . Hyperlipidemia   . CHF (congestive heart failure)   . Atrial fibrillation   . Arthritis   . Cancer 07/02/11    Left face    Past Surgical History  Procedure Laterality Date  . Nephrectomy      right kidney  . Abdominal hysterectomy    . Appendectomy    . Tonsillectomy    . Av fistula placement  2010    left brachiocephalic AVF  . Peritoneal catheter insertion  2011    History reviewed. No pertinent family history.   History  Substance Use Topics  . Smoking status: Never Smoker   . Smokeless tobacco: Not on file  . Alcohol Use: No    Prior to Admission  medications   Medication Sig Start Date End Date Taking? Authorizing Provider  acetaminophen (TYLENOL) 500 MG tablet Take 1,000 mg by mouth daily as needed for fever.   Yes Historical Provider, MD  amiodarone (PACERONE) 200 MG tablet Take 100 mg by mouth daily. Take 1 tablet once daily for 7 days, then take 1/2 tablet daily   Yes Historical Provider, MD  aspirin 81 MG tablet Take 81 mg by mouth daily.   Yes Historical Provider, MD  azithromycin (ZITHROMAX) 250 MG tablet Take 250 mg by mouth daily. Take 2 tablets the first day, then take 1 tablet daily for 4 days.   Yes Historical Provider, MD  clopidogrel (PLAVIX) 75 MG tablet Take 75 mg by mouth daily.   Yes Historical Provider, MD  diclofenac sodium (VOLTAREN) 1 % GEL Apply 2 g topically 4 (four) times daily.   Yes Historical Provider, MD  docusate sodium (COLACE) 100 MG capsule Take 100 mg by mouth 2 (two) times daily.   Yes Historical Provider, MD  gabapentin (NEURONTIN) 100 MG capsule Take 100 mg by mouth 2 (two) times daily.   Yes Historical Provider, MD  levothyroxine (SYNTHROID, LEVOTHROID) 50 MCG tablet Take 50 mcg by mouth daily.    Yes Historical Provider, MD  lidocaine (LIDODERM) 5 % Place 1 patch onto the skin daily.  Remove & Discard patch within 12 hours or as directed by MD   Yes Historical Provider, MD  lovastatin (MEVACOR) 20 MG tablet Take 20 mg by mouth at bedtime.    Yes Historical Provider, MD  mirtazapine (REMERON) 15 MG tablet Take 15 mg by mouth at bedtime.   Yes Historical Provider, MD  omeprazole (PRILOSEC) 20 MG capsule Take 20 mg by mouth 2 (two) times daily before a meal.   Yes Historical Provider, MD  rOPINIRole (REQUIP) 0.25 MG tablet Take 0.25 mg by mouth daily.   Yes Historical Provider, MD  sevelamer carbonate (RENVELA) 800 MG tablet Take 2,400 mg by mouth 3 (three) times daily with meals.   Yes Historical Provider, MD  lactulose (CHRONULAC) 10 GM/15ML solution Take 10 g by mouth every 2 (two) hours as needed. Until  diarrhea    Historical Provider, MD    Current Facility-Administered Medications  Medication Dose Route Frequency Provider Last Rate Last Dose  . 0.9 %  sodium chloride infusion  250 mL Intravenous PRN Darden Palmer, MD      . 0.9 %  sodium chloride infusion   Intravenous Continuous Maree Krabbe, MD      . acetaminophen (TYLENOL) tablet 650 mg  650 mg Oral Q6H PRN Darden Palmer, MD       Or  . acetaminophen (TYLENOL) suppository 650 mg  650 mg Rectal Q6H PRN Darden Palmer, MD      . amiodarone (PACERONE) tablet 100 mg  100 mg Oral Daily Darden Palmer, MD   100 mg at 01/05/13 0918  . darbepoetin (ARANESP) injection 100 mcg  100 mcg Subcutaneous Q [id number] Maree Krabbe, MD      . dialysis solution 1.5% low-MG/low-CA [facility]) CAPD   Intraperitoneal Continuous Dyke Maes, MD   2,000 mL at 01/05/13 1048  . gabapentin (NEURONTIN) capsule 100 mg  100 mg Oral BID Darden Palmer, MD   100 mg at 01/05/13 0918  . lactose free nutrition (BOOST PLUS) liquid 237 mL  237 mL Oral BID BM Annett Gula, MD   237 mL at 01/05/13 1241  . [START ON 01/06/2013] levofloxacin (LEVAQUIN) IVPB 500 mg  500 mg Intravenous Q48H Rachel Lynn Rumbarger, RPH      . levothyroxine (SYNTHROID, LEVOTHROID) tablet 50 mcg  50 mcg Oral QAC breakfast Darden Palmer, MD   50 mcg at 01/05/13 0742  . ondansetron (ZOFRAN) injection 4 mg  4 mg Intravenous Q8H PRN Carlynn Purl, DO   4 mg at 01/05/13 0452   Or  . ondansetron (ZOFRAN) tablet 4 mg  4 mg Oral Q8H PRN Carlynn Purl, DO      . pantoprazole (PROTONIX) injection 40 mg  40 mg Intravenous Once Carlynn Purl, DO      . potassium chloride injection 12 mEq  12 mEq Intraperitoneal PRN Jonah Blue, DO   12 mEq at 01/05/13 1049  . potassium chloride SA (K-DUR,KLOR-CON) CR tablet 40 mEq  40 mEq Oral Once Maree Krabbe, MD      . rOPINIRole (REQUIP) tablet 0.25 mg  0.25 mg Oral QHS Darden Palmer, MD   0.25 mg at 01/04/13 2227  . sevelamer carbonate (RENVELA)  tablet 2,400 mg  2,400 mg Oral TID WC Darden Palmer, MD   2,400 mg at 01/05/13 1150  . simvastatin (ZOCOR) tablet 10 mg  10 mg Oral QHS Darden Palmer, MD   10 mg at 01/04/13 2227  . sodium chloride 0.9 % injection 3 mL  3  mL Intravenous Q12H Darden Palmer, MD   3 mL at 01/05/13 1000  . sodium chloride 0.9 % injection 3 mL  3 mL Intravenous Q12H Darden Palmer, MD      . sodium chloride 0.9 % injection 3 mL  3 mL Intravenous PRN Darden Palmer, MD        Allergies as of 01/04/2013 - Review Complete 01/04/2013  Allergen Reaction Noted  . Latex  02/01/2011  . Other  02/01/2011  . Penicillins  02/01/2011  . Sulfa antibiotics  02/01/2011    Review of Systems:    Positive for chills, fatigue, dizziness. All other systems reviewed and negative except where noted in HPI.    Physical Exam:  Vital signs in last 24 hours: Temp:  [98 F (36.7 C)-98.4 F (36.9 C)] 98.2 F (36.8 C) (11/21 1200) Pulse Rate:  [63-81] 67 (11/21 0729) Resp:  [8-24] 12 (11/21 0729) BP: (77-105)/(39-60) 88/44 mmHg (11/21 0729) SpO2:  [95 %-100 %] 100 % (11/21 0729) Weight:  [119 lb 14.9 oz (54.4 kg)] 119 lb 14.9 oz (54.4 kg) (11/20 2140) Last BM Date: 01/05/13 General:   Pleasant white female in NAD Head:  Normocephalic and atraumatic. Eyes:   No icterus.   Conjunctiva pale. Ears:  Normal auditory acuity. Neck:  Supple; no masses felt Lungs:  Respirations even and unlabored. Lungs clear to auscultation bilaterally.   No wheezes, crackles, or rhonchi.  Heart:  Regular rate and rhythm Abdomen:  Soft, nondistended, nontender. Normal bowel sounds. No appreciable masses or hepatomegaly.  Rectal:  Extremely tender, edematous area at posterior midline. This appears to be hermorrhoid tissue but given degree of tenderness it culd be a fissure. In adequate visualization as stool continuously seeping from anus and patient is too sore for adequate exam. Of note, stool is light brown Msk:  Symmetrical without gross  deformities.  Extremities:  Without edema. Neurologic:  Alert and  oriented x4;  grossly normal neurologically. Skin:  Intact without significant lesions or rashes. Cervical Nodes:  No significant cervical adenopathy. Psych:  Alert and cooperative. Normal affect.  LAB RESULTS:  Recent Labs  01/04/13 1311 01/05/13 0434 01/05/13 0720  WBC 34.9* 24.8* 22.9*  HGB 9.0* 6.8* 6.9*  HCT 27.0* 20.4* 20.8*  PLT 404* 345 337   BMET  Recent Labs  01/04/13 1311 01/05/13 0434  NA 131* 127*  K 2.9* 3.1*  CL 87* 89*  CO2 26 22  GLUCOSE 133* 97  BUN 42* 41*  CREATININE 5.75* 5.52*  CALCIUM 9.3 8.1*   LFT  Recent Labs  01/04/13 1311 01/05/13 0434  PROT 6.5  --   ALBUMIN 2.3* 1.5*  AST 20  --   ALT 12  --   ALKPHOS 153*  --   BILITOT 0.4  --    PT/INR  Recent Labs  01/04/13 1311  LABPROT 14.6  INR 1.16    STUDIES: Dg Abd 1 View  01/04/2013   CLINICAL DATA:  Abdominal pain and vomiting. Status post placement of peritoneal catheter 3 days ago.  EXAM: ABDOMEN - 1 VIEW  COMPARISON:  CT abdomen and pelvis 12/05/2011.  FINDINGS: Peritoneal dialysis catheter from a left lower quadrant approach is in place with the tip in the pelvis. There is a large volume of stool throughout the colon. No evidence of bowel obstruction is seen. No abnormal abdominal calcification is identified. Convex left scoliosis is noted.  IMPRESSION: No acute finding.  Peritoneal dialysis catheter in place without evidence of complication.  Large volume  of stool throughout the colon.   Electronically Signed   By: Drusilla Kanner M.D.   On: 01/04/2013 13:43   Ct Abdomen Pelvis W Contrast  01/04/2013   CLINICAL DATA:  Abdominal pain. Constipation and now diarrhea, fever, rectal pain and bleeding.  EXAM: CT ABDOMEN AND PELVIS WITH CONTRAST  TECHNIQUE: Multidetector CT imaging of the abdomen and pelvis was performed using the standard protocol following bolus administration of intravenous contrast.  CONTRAST:   OMNIPAQUE IOHEXOL 300 MG/ML  SOLN  COMPARISON:  12/05/2011  FINDINGS: The lung bases demonstrate by lateral linear density likely atelectasis with small amount of right pleural fluid. Coronary artery calcifications are present. There is mild cardiomegaly.  Abdominal images demonstrate evidence of patient's peritoneal dialysis catheter entering the left lower quadrant with tip over the pelvis just left of midline without significant change. There is mild ascites present likely due to patient's peritoneal dialysis slightly more pronounced compared to the prior exam. The liver, spleen, pancreas, gallbladder and adrenal glands are within normal. There is absence of the right kidney. The left kidney demonstrates a well-defined 1.9 cm homogeneously hypodense exophytic mass over the upper pole with Hounsfield unit measurements of 24 unchanged in size since 2012 and likely a slightly hyperdense cyst. There is continued evidence of a 2.3 cm solid and fatty mass over the superior pole of the left kidney as this is unchanged in size compared to 2012, although demonstrates slightly less fat component. Appendix is not definitely seen. There is moderate calcified atherosclerotic plaque of the abdominal aorta iliac vessels.  There is no evidence of bowel obstruction. Air is present throughout the colon. There are a few small flecks of free air over the anterior aspect of the left abdomen just above the peritoneal dialysis catheter entry point without change from the prior exams.  Pelvic images demonstrate suggestion mild wall thickening over the distal rectal region. There is no change in mild compression deformity of T12 as there is also a mild compression deformity of T9 age-indeterminate.  IMPRESSION: Mild wall thickening over the distal rectum which may be due to proctitis.  Left-sided peritoneal dialysis catheter with tip over the pelvis just left of midline without significant change. Ascites slightly worse.  Mild  bibasilar atelectatic change and small amount of right pleural fluid.  Probable hyperdense cyst over the upper pole of the left kidney unchanged from 2012. 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 Recommend followup CT in 6 months.  Stable T12 compression fracture with mild T9 compression fracture age indeterminate.   Electronically Signed   By: Elberta Fortis M.D.   On: 01/04/2013 17:54   Dg Chest Port 1 View  01/05/2013   CLINICAL DATA:  Central line placement.  EXAM: PORTABLE CHEST - 1 VIEW  COMPARISON:  04/24/2012.  FINDINGS: Left IJ catheter noted with tip projected over superior vena cava. Mild atelectasis left lung base. Lungs are clear. No pneumothorax. Heart size normal.  IMPRESSION: 1. Interim placement of left IJ line, its tip is projected over superior vena cava. 2. Mild subsegmental atelectasis left lung base.   Electronically Signed   By: Maisie Fus  Register   On: 01/05/2013 14:09     Impression / Plan:   36. 79 year old female with multiple medical  problems including, but not limited to ESRD on peritoneal dialysis, atrial fibrillation and diabetes. She is on chronic plavix.  2. Abdominal pain, marked leukocytosis. Recurrent peritonitis suspected by Nephrology. On antibiotics.   3. Acute constipation / fecal impaction / rectal pain and bleeding. Currently incontinent of loose stool, suspect overflow diarrhea. Bleeding probably hemorrhoidal or may have a fissure, especially after attempt to manually disimpact herself. C-diff negative. Will give SMOG enema. There is mild rectal wall thickening on CTscan. She could have stercoral ulcer. Trial of Xylocaine Gel for local pain relief. Following enema will begin steroid suppositories. Will need to reexamine her in next day or two to evaluate for  fissure.   4. Acute on chronic anemia. Rectal bleeding sounds too minimal to have caused hgb drop. Daughter reports very dark stools over last few days but stool light brown today. Iron stores are pending. Blood transfusion in progress. Doubt GI etiology.   Thanks   LOS: 1 day   Willette Cluster  01/05/2013, 2:13 PM  ________________________________________________________________________  [facility] MD note:  I personally examined the patient, reviewed the data and agree with the assessment and plan described above.  Will attempt conservative therapy for her constipation.  She has bacterial peritonitis currently and I am reluctant to recommend and invasive testing for now.  She will at some point need at least a flex sig, perhaps full colonoscopy. Thsi can be done as outpatient if she improves from constipation standpoint while in hosp. I would be happy to take care of this for her or she can go to her gastroenterologist in [place], Dr. Braulio Conte.   Rob Bunting, MD [facility] Pager [phone]

## 2013-01-05 NOTE — Progress Notes (Signed)
Enema given, patient unable to hold. Only tolerated about 650 cc's of enema.

## 2013-01-05 NOTE — Progress Notes (Signed)
Pt BP 80s/40s MAP 52. MD notified. No new orders received. Goal MAP to be greater than 50. Will continue to monitor.

## 2013-01-05 NOTE — Progress Notes (Signed)
Utilization review completed.  

## 2013-01-05 NOTE — Progress Notes (Signed)
Internal Medicine Teaching Service Night Float Progress Note  S: Patient continues to have soft BP.  Ms. Inthavong reports she fells "fine," does admit she is tired and a little thirsty.  O: BP 81/42, MAP:51, RR 10, P: 65 Gen: Resting comfortably in bed Cardiac: RRR Abd: Soft, Mild diffuse tenderness Neuro: AAOx4. Conversing appropriately.   A:  Hypotension is setting of ESRD with Peritonitis.  P: - Spoke with [facility] CCMD- Dr. Darrick Penna, patient has a history of low blood pressures and is a peritoneal dialysis patient with appropriate  Mentation- would not treat low BP at this time. - Continue to monitor

## 2013-01-05 NOTE — Progress Notes (Signed)
CRITICAL VALUE ALERT  Critical value received:  Hgb 6.8  Date of notification:  01/05/2013  Time of notification:  0553  Critical value read back:yes  Nurse who received alert:  K. Vincenza Hews  MD notified (1st page):  Dr. Mikey Bussing  Time of first page:  0555  MD notified (2nd page):  Time of second page:  Responding MD:  Dr. Mikey Bussing  Time MD responded:  [phone]

## 2013-01-05 NOTE — Clinical Documentation Improvement (Signed)
THIS DOCUMENT IS NOT A PERMANENT PART OF THE MEDICAL RECORD  Please update your documentation with the medical record to reflect your response to this query. If you need help knowing how to do this please call [phone].  01/05/13   Dear Dr. Carlynn Purl,   In a better effort to capture your patient's severity of illness, reflect appropriate length of stay and utilization of resources, a review of the patient medical record has revealed the following indicators.    Based on your clinical judgment, please clarify and document in a progress note and/or discharge summary the clinical condition associated with the following supporting information: In the H&P, you documented malnutrition under the Assessment/Plan.  "Patient admits decreased PO intake x 2 days. Albumin 2.3 on admission.  Consider nutrition consult."   In responding to this query please exercise your independent judgment.  The fact that a query is asked, does not imply that any particular answer is desired or expected.  Possible Clinical Conditions?  _______Mild Malnutrition  _______Moderate Malnutrition _______Severe Malnutrition   _______Protein Calorie Malnutrition _______Severe Protein Calorie Malnutrition   _______Cannot clinically determine    Ht: 5\' 3"      Wt: 119 lbs.  BMI:  21.3  Albumin level:  2.3 g/dL    Total Protein:  6.5 g/dL   You may use possible, probable, or suspect with inpatient documentation.  Possible, probable, suspected diagnoses MUST be documented at the time of discharge.   Reviewed: additional documentation in the medical record  Thank You,  Darla Lesches, RN, BSN, CCRN Clinical Documentation Improvement Specialist HIM department--[place] Office [phone]

## 2013-01-05 NOTE — Progress Notes (Signed)
Subjective: Valerie Yoder is a 79 year old female with a PMHx of ESRD on peritoneal dialysis, admitted for peritonitis.  Patient seen at bedside this AM. Has very little complaints today. Denies any abdominal pain. Still complaining of rectal pain, leaking loose stool. No blood on exam. Tenderness to palpation.  Objective: Vital signs in last 24 hours: Filed Vitals:   01/04/13 2321 01/05/13 0000 01/05/13 0116 01/05/13 0347  BP: 84/39  83/43 77/40  Pulse:  66 63 69  Temp:  98.4 F (36.9 C)  98.3 F (36.8 C)  TempSrc:  Oral  Oral  Resp:  13 22 11   Height:      Weight:      SpO2:  100% 99% 99%   Weight change:   Intake/Output Summary (Last 24 hours) at 01/05/13 8295 Last data filed at 01/04/13 1740  Gross per 24 hour  Intake    500 ml  Output      0 ml  Net    500 ml   Physical Exam: General: Alert, cooperative, and in no apparent distress HEENT: Vision grossly intact, oropharynx clear and non-erythematous  Neck: Full range of motion without pain, supple, no lymphadenopathy or carotid bruits Lungs: Clear to ascultation bilaterally, normal work of respiration, no wheezes, rales, ronchi Heart: Regular rate and rhythm, no murmurs, gallops, or rubs Abdomen: Soft, non-tender, mildly distended, normal bowel sounds. Peritoneal dialysis site clean, dry, intact. Extremities: No cyanosis, clubbing, or edema Neurologic: Alert & oriented X3, cranial nerves II-XII intact, strength grossly intact, sensation intact to light touch  Lab Results: Basic Metabolic Panel:  Recent Labs Lab 01/04/13 1311 01/05/13 0434  NA 131* 127*  K 2.9* 3.1*  CL 87* 89*  CO2 26 22  GLUCOSE 133* 97  BUN 42* 41*  CREATININE 5.75* 5.52*  CALCIUM 9.3 8.1*  MG  --  1.5  PHOS  --  3.8   Liver Function Tests:  Recent Labs Lab 01/04/13 1311 01/05/13 0434  AST 20  --   ALT 12  --   ALKPHOS 153*  --   BILITOT 0.4  --   PROT 6.5  --   ALBUMIN 2.3* 1.5*    Recent Labs Lab 01/04/13 1311    LIPASE 17   No results found for this basename: AMMONIA,  in the last 168 hours CBC:  Recent Labs Lab 01/04/13 1311 01/05/13 0434 01/05/13 0720  WBC 34.9* 24.8* 22.9*  NEUTROABS 30.9* 22.0*  --   HGB 9.0* 6.8* 6.9*  HCT 27.0* 20.4* 20.8*  MCV 85.4 85.4 84.9  PLT 404* 345 337   Cardiac Enzymes:  Recent Labs Lab 01/04/13 0013 01/05/13 0434  TROPONINI <0.30 <0.30   CBG:  Recent Labs Lab 01/04/13 2142 01/05/13 0346 01/05/13 0749  GLUCAP 102* 125* 135*   Coagulation:  Recent Labs Lab 01/04/13 1311  LABPROT 14.6  INR 1.16   Micro Results: Recent Results (from the past 240 hour(s))  BODY FLUID CULTURE     Status: None   Collection Time    01/04/13  4:12 PM      Result Value Range Status   Specimen Description PERITONEAL DIALYSATE   Final   Special Requests NONE   Final   Gram Stain     Final   Value: NO WBC SEEN     NO ORGANISMS SEEN     Performed at [facility]   Culture PENDING   Incomplete   Report Status PENDING   Incomplete  CULTURE, BLOOD (  ROUTINE X 2)     Status: None   Collection Time    01/04/13  6:05 PM      Result Value Range Status   Specimen Description BLOOD HAND RIGHT   Final   Special Requests BOTTLES DRAWN AEROBIC ONLY 3CC   Final   Culture  Setup Time     Final   Value: 01/04/2013 22:14     Performed at [facility]   Culture     Final   Value:        BLOOD CULTURE RECEIVED NO GROWTH TO DATE CULTURE WILL BE HELD FOR 5 DAYS BEFORE ISSUING A FINAL NEGATIVE REPORT     Performed at [facility]   Report Status PENDING   Incomplete  MRSA PCR SCREENING     Status: None   Collection Time    01/04/13 10:12 PM      Result Value Range Status   MRSA by PCR NEGATIVE  NEGATIVE Final   Comment:            The GeneXpert MRSA Assay (FDA     approved for NASAL specimens     only), is one component of a     comprehensive MRSA colonization     surveillance program. It is not     intended to diagnose MRSA     infection  nor to guide or     monitor treatment for     MRSA infections.   Studies/Results: Dg Abd 1 View  01/04/2013   CLINICAL DATA:  Abdominal pain and vomiting. Status post placement of peritoneal catheter 3 days ago.  EXAM: ABDOMEN - 1 VIEW  COMPARISON:  CT abdomen and pelvis 12/05/2011.  FINDINGS: Peritoneal dialysis catheter from a left lower quadrant approach is in place with the tip in the pelvis. There is a large volume of stool throughout the colon. No evidence of bowel obstruction is seen. No abnormal abdominal calcification is identified. Convex left scoliosis is noted.  IMPRESSION: No acute finding.  Peritoneal dialysis catheter in place without evidence of complication.  Large volume of stool throughout the colon.   Electronically Signed   By: Drusilla Kanner M.D.   On: 01/04/2013 13:43   Ct Abdomen Pelvis W Contrast  01/04/2013   CLINICAL DATA:  Abdominal pain. Constipation and now diarrhea, fever, rectal pain and bleeding.  EXAM: CT ABDOMEN AND PELVIS WITH CONTRAST  TECHNIQUE: Multidetector CT imaging of the abdomen and pelvis was performed using the standard protocol following bolus administration of intravenous contrast.  CONTRAST:  OMNIPAQUE IOHEXOL 300 MG/ML  SOLN  COMPARISON:  12/05/2011  FINDINGS: The lung bases demonstrate by lateral linear density likely atelectasis with small amount of right pleural fluid. Coronary artery calcifications are present. There is mild cardiomegaly.  Abdominal images demonstrate evidence of patient's peritoneal dialysis catheter entering the left lower quadrant with tip over the pelvis just left of midline without significant change. There is mild ascites present likely due to patient's peritoneal dialysis slightly more pronounced compared to the prior exam. The liver, spleen, pancreas, gallbladder and adrenal glands are within normal. There is absence of the right kidney. The left kidney demonstrates a well-defined 1.9 cm homogeneously hypodense  exophytic mass over the upper pole with Hounsfield unit measurements of 24 unchanged in size since 2012 and likely a slightly hyperdense cyst. There is continued evidence of a 2.3 cm solid and fatty mass over the superior pole of the left kidney as this is unchanged  in size compared to 2012, although demonstrates slightly less fat component. Appendix is not definitely seen. There is moderate calcified atherosclerotic plaque of the abdominal aorta iliac vessels.  There is no evidence of bowel obstruction. Air is present throughout the colon. There are a few small flecks of free air over the anterior aspect of the left abdomen just above the peritoneal dialysis catheter entry point without change from the prior exams.  Pelvic images demonstrate suggestion mild wall thickening over the distal rectal region. There is no change in mild compression deformity of T12 as there is also a mild compression deformity of T9 age-indeterminate.  IMPRESSION: Mild wall thickening over the distal rectum which may be due to proctitis.  Left-sided peritoneal dialysis catheter with tip over the pelvis just left of midline without significant change. Ascites slightly worse.  Mild bibasilar atelectatic change and small amount of right pleural fluid.  Probable hyperdense cyst over the upper pole of the left kidney unchanged from 2012. 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 Recommend followup CT in 6 months.  Stable T12 compression fracture with mild T9 compression fracture age indeterminate.   Electronically Signed   By: Elberta Fortis M.D.   On: 01/04/2013 17:54   Medications: I have reviewed the patient's current medications. Scheduled Meds: . amiodarone  100 mg Oral Daily  . gabapentin  100 mg Oral BID  . [START ON 01/06/2013]  levofloxacin (LEVAQUIN) IV  500 mg Intravenous Q48H  . levothyroxine  50 mcg Oral QAC breakfast  . pantoprazole (PROTONIX) IV  40 mg Intravenous Once  . rOPINIRole  0.25 mg Oral QHS  . sevelamer carbonate  2,400 mg Oral TID WC  . simvastatin  10 mg Oral QHS  . sodium chloride  3 mL Intravenous Q12H  . sodium chloride  3 mL Intravenous Q12H   Continuous Infusions: . dialysis solution 1.5% low-MG/low-CA    . dialysis solution 1.5% low-MG/low-CA     PRN Meds:.sodium chloride, acetaminophen, acetaminophen, ondansetron (ZOFRAN) IV, ondansetron, potassium chloride, sodium chloride  Assessment/Plan: Valerie Yoder is a 79 year old female with a PMHx of ESRD on peritoneal dialysis, admitted for peritonitis.  Peritonitis- Patient with history of ESRD on peritoneal dialysis presented with 3-4 days history of fever/chills, diffuse abdominal pain, fatigue, and found to have a leukocytosis of 35k, lactic acid of 2.6. Baseline hypotension per patient with SBP 70-90s. Nephrology was consulted in the ED and obtained a peritoneal fluid cell count and culture, cell count revealed a WBC 1014, w/ 64% PMNs.  - Continue Vancomycin and Levaquin as patient has a penicillin allergy and for additional pseudomonal coverage. Seen by ID today, agree with plan.  - Follow Peritoneal fluid culture, Blood cultures  - No EKG changes, cycle CE x3 -ve - Lipase normal - Lactic acid 2.0.  Anemia w/ Hypotension- Patient w/ Hb of 9.0 on admission. Then found to be 6.8. Patient also w/ hypotension, but patient claims she is always 80/40-50's. No obvious signs of bleeding at this time. Patient does not appear severely septic at this time. Lost PIV today, central line started.  - Will continue giving 500 cc boluses. - Give 1 unit PRBCs and reassess.  - Recheck CBC in PM and again in AM.   End stage renal  disease- Patient has been on peritoneal dialysis x 4 years.  - Nephrology consulted. Patient has had bacillus peritonitis in  10/2012. Successfully treated w/ IV vancomycin. Agree w/ treatment plan at this time. - Continue peritoneal dialysis per renal.  - Hypokalemia, Hyponatremia noted. Management with dialysis orders.   Proctitis- Patient describes attempting to disimpact herself 3 days ago, since then she has had perirectal pain and seen some blood in stools. FOBT +. She was noted to have mild wall thickening of the distal rectum. She denies any history of UC or radiation.  - Patient disimpacted in ED.  - Patient with diarrhea and was on antibiotics on home with azithromycin - No sign of anal fissure on exam.  - Patient on broad spectrum antibiotics for peritonitis, no sign of abscess formation on CT.  -C. Dif -ve - Consider outpatient colonoscopy  - Continue to trend CBC. See above -Gi consulted for input as patient w/ significant findings on CT + decreasing Hb. Recs pending.  Hx of Afib--Was previously on coumadin, now on plavix. Also on amiodarone. In sinus rhythm on admission.  -holding plavix in setting of rectal bleeding  -continue amiodarone   Osteoporosis- Patient has ESRD. Stable T 12 compression fracture, mild T9 compression fracture. Alk phos elevated without elevation of AST/ALT.  - Management as outpatient.   Malnutrition- Patient admits decreased PO intake x 2 days. Albumin 2.3 on admission.  - Consider nutrition consult.   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  Anticipated discharge in approximately 2-3 day(s).   The patient does have a current PCP (Amy Moon, NP) and does need an [facility] hospital follow-up appointment after discharge.  The patient does not have transportation limitations that hinder transportation to clinic appointments.  .Services Needed at time of discharge: Y = Yes, Blank = No PT:   OT:   RN:   Equipment:   Other:     LOS: 1 day   Valerie Paris, MD 01/05/2013, 8:38 AM Pager: [phone]

## 2013-01-06 DIAGNOSIS — D649 Anemia, unspecified: Secondary | ICD-10-CM | POA: Diagnosis present

## 2013-01-06 DIAGNOSIS — I359 Nonrheumatic aortic valve disorder, unspecified: Secondary | ICD-10-CM

## 2013-01-06 DIAGNOSIS — R195 Other fecal abnormalities: Secondary | ICD-10-CM | POA: Diagnosis present

## 2013-01-06 DIAGNOSIS — E871 Hypo-osmolality and hyponatremia: Secondary | ICD-10-CM | POA: Diagnosis present

## 2013-01-06 DIAGNOSIS — I5189 Other ill-defined heart diseases: Secondary | ICD-10-CM | POA: Diagnosis present

## 2013-01-06 LAB — BODY FLUID CELL COUNT WITH DIFFERENTIAL
Eos, Fluid: 0 %
Lymphs, Fluid: 12 %
Neutrophil Count, Fluid: 28 % — ABNORMAL HIGH (ref 0–25)

## 2013-01-06 LAB — CBC WITH DIFFERENTIAL/PLATELET
Basophils Absolute: 0 10*3/uL (ref 0.0–0.1)
Basophils Relative: 0 % (ref 0–1)
Eosinophils Absolute: 0.3 10*3/uL (ref 0.0–0.7)
HCT: 25.7 % — ABNORMAL LOW (ref 36.0–46.0)
MCH: 28.7 pg (ref 26.0–34.0)
MCHC: 33.1 g/dL (ref 30.0–36.0)
Neutrophils Relative %: 85 % — ABNORMAL HIGH (ref 43–77)
Platelets: 347 10*3/uL (ref 150–400)
RBC: 2.96 MIL/uL — ABNORMAL LOW (ref 3.87–5.11)
RDW: 16.7 % — ABNORMAL HIGH (ref 11.5–15.5)

## 2013-01-06 LAB — RENAL FUNCTION PANEL
Albumin: 1.5 g/dL — ABNORMAL LOW (ref 3.5–5.2)
BUN: 31 mg/dL — ABNORMAL HIGH (ref 6–23)
Chloride: 92 mEq/L — ABNORMAL LOW (ref 96–112)
GFR calc Af Amer: 10 mL/min — ABNORMAL LOW (ref >90)
GFR calc non Af Amer: 8 mL/min — ABNORMAL LOW (ref >90)
Potassium: 3.9 mEq/L (ref 3.5–5.1)
Sodium: 128 mEq/L — ABNORMAL LOW (ref 135–145)

## 2013-01-06 LAB — IRON AND TIBC
Saturation Ratios: 55 % (ref 20–55)
TIBC: 107 ug/dL — ABNORMAL LOW (ref 250–470)
UIBC: 48 ug/dL — ABNORMAL LOW (ref 125–400)

## 2013-01-06 LAB — CBC
MCH: 28.6 pg (ref 26.0–34.0)
MCHC: 33.2 g/dL (ref 30.0–36.0)
MCV: 86.1 fL (ref 78.0–100.0)
Platelets: 328 10*3/uL (ref 150–400)
RBC: 2.94 MIL/uL — ABNORMAL LOW (ref 3.87–5.11)
RDW: 16.5 % — ABNORMAL HIGH (ref 11.5–15.5)
WBC: 19.2 10*3/uL — ABNORMAL HIGH (ref 4.0–10.5)

## 2013-01-06 LAB — TOTAL BILIRUBIN, BODY FLUID: Total bilirubin, fluid: <0.1 mg/dL

## 2013-01-06 LAB — GLUCOSE, CAPILLARY: Glucose-Capillary: 109 mg/dL — ABNORMAL HIGH (ref 70–99)

## 2013-01-06 LAB — FERRITIN: Ferritin: 1851 ng/mL — ABNORMAL HIGH (ref 10–291)

## 2013-01-06 MED ORDER — PANTOPRAZOLE SODIUM 40 MG PO TBEC
40.0000 mg | DELAYED_RELEASE_TABLET | Freq: Every day | ORAL | Status: DC
  Administered 2013-01-06 – 2013-01-09 (×4): 40 mg via ORAL
  Filled 2013-01-06 (×4): qty 1

## 2013-01-06 MED ORDER — POTASSIUM CHLORIDE CRYS ER 20 MEQ PO TBCR
40.0000 meq | EXTENDED_RELEASE_TABLET | Freq: Two times a day (BID) | ORAL | Status: DC
  Administered 2013-01-06: 40 meq via ORAL
  Filled 2013-01-06 (×2): qty 2

## 2013-01-06 NOTE — Progress Notes (Signed)
[facility] Progress Note    Since last GI note: Feeling overall stronger this AM.  No abd pains, no n/v.  Still loose stools.  Didn't really retain the emema given yesterday.  She is looking forward to BF this AM.  Objective: Vital signs in last 24 hours: Temp:  [97.7 F (36.5 C)-99 F (37.2 C)] 98.8 F (37.1 C) (11/22 0715) Pulse Rate:  [65-81] 73 (11/22 0715) Resp:  [12-22] 15 (11/22 0715) BP: (88-114)/(42-59) 97/54 mmHg (11/22 0715) SpO2:  [96 %-100 %] 96 % (11/22 0715) Weight:  [126 lb 12.8 oz (57.516 kg)] 126 lb 12.8 oz (57.516 kg) (11/22 0418) Last BM Date: 01/05/13 General: alert and oriented times 3 Heart: regular rate and rythm Abdomen: soft, non-tender, non-distended, normal bowel sounds   Lab Results:  Recent Labs  01/05/13 0720 01/05/13 1800 01/06/13 0434  WBC 22.9* 21.0* 19.2*  HGB 6.9* 8.1* 8.4*  PLT 337 304 328  MCV 84.9 86.0 86.1    Recent Labs  01/04/13 1311 01/05/13 0434  NA 131* 127*  K 2.9* 3.1*  CL 87* 89*  CO2 26 22  GLUCOSE 133* 97  BUN 42* 41*  CREATININE 5.75* 5.52*  CALCIUM 9.3 8.1*    Recent Labs  01/04/13 1311 01/05/13 0434  PROT 6.5  --   ALBUMIN 2.3* 1.5*  AST 20  --   ALT 12  --   ALKPHOS 153*  --   BILITOT 0.4  --     Recent Labs  01/04/13 1311  INR 1.16    Medications: Scheduled Meds: . amiodarone  100 mg Oral Daily  . darbepoetin (ARANESP) injection - NON-DIALYSIS  100 mcg Subcutaneous Q Fri-1800  . gabapentin  100 mg Oral BID  . hydrocortisone  25 mg Rectal BID  . lactose free nutrition  237 mL Oral BID BM  . levofloxacin (LEVAQUIN) IV  500 mg Intravenous Q48H  . levothyroxine  50 mcg Oral QAC breakfast  . lidocaine   Topical TID  . pantoprazole (PROTONIX) IV  40 mg Intravenous Once  . rOPINIRole  0.25 mg Oral QHS  . sevelamer carbonate  2,400 mg Oral TID WC  . simvastatin  10 mg Oral QHS  . sodium chloride  3 mL Intravenous Q12H  . sodium chloride  3 mL Intravenous Q12H   Continuous  Infusions: . sodium chloride 65 mL/hr (01/05/13 1816)  . dialysis solution 1.5% low-MG/low-CA     PRN Meds:.sodium chloride, acetaminophen, acetaminophen, ondansetron (ZOFRAN) IV, ondansetron, potassium chloride, sodium chloride    Assessment/Plan: 79 y.o. female with peritonitis, loose stools since severe constipation, disimpaction several days ago.    I think her bowels will improve as she recovers more from her peritonitis.  Not sure she is still impacted with overflow diarrhea, perhaps she simply has diarrhea now from Abx, not eating solids for a few days.  She is fob +.  C. Diff neg.  Would observe her clinically.  She will at some point need at least a flex sig, perhaps full colonoscopy. Thsi can be done as outpatient if she improves from constipation standpoint while in hosp. I would be happy to take care of this for her or she can go to her gastroenterologist in [place], Dr. Braulio Conte.   Rachael Fee, MD  01/06/2013, 7:23 AM [facility] Pager [phone]

## 2013-01-06 NOTE — Progress Notes (Signed)
  Echocardiogram 2D Echocardiogram has been performed.  Georgian Co 01/06/2013, 9:53 AM

## 2013-01-06 NOTE — Progress Notes (Signed)
  [facility] Progress Note   Subjective: Feels stronger today, still loose stools, no major BM, didn't retain enema very well yest. WBC down to 19k  Exam  Blood pressure 97/54, pulse 73, temperature 98.8 F (37.1 C), temperature source Oral, resp. rate 15, height 5\' 3"  (1.6 m), weight 57.516 kg (126 lb 12.8 oz), SpO2 96.00%. gen: calm, alert, looks a little stronger neck: no jvd, flat neck veins  chest: occ crackles at [facility] cor: regular, no M or rub  abd: soft, obese, nontender, PD cath exit site is clean  ext: no leg or UE edema , +muscle wasting  neuro: alert, ox3, nonfoca  access: PD cath as above  Dialysis Orders: [facility] [place]  Hangs 5L 1.5% and 5L 2.5%, does pause then 3 overnight cycles of and leaves a day bag of also. 5 cycles total   Assessment/Plan:  1. Peritonitis: likely catheter-related, cont IV vanc and levaquin, await cx results, daily cell count 2. Hx Bacillus peritonitis Sept '14, treated, cath not removed 3. ESRD: CAPD 5x/day with low dextrose concentration 4. Vol depletion: BP up, will stop IVF"s 5. Anemia of ckd- s/p prbc's, give darbe 100 today sq , fe stores pend 6. MBD (metabolic bone disease)- phos 3.8, Ca 8.1, both are in range, cont renvela with meals 7. Constipation: per GI, primary   Vinson Moselle MD  pager [phone]    cell [phone]  01/06/2013, 9:50 AM   Recent Labs Lab 01/04/13 1311 01/05/13 0434  NA 131* 127*  K 2.9* 3.1*  CL 87* 89*  CO2 26 22  GLUCOSE 133* 97  BUN 42* 41*  CREATININE 5.75* 5.52*  CALCIUM 9.3 8.1*  PHOS  --  3.8    Recent Labs Lab 01/04/13 1311 01/05/13 0434  AST 20  --   ALT 12  --   ALKPHOS 153*  --   BILITOT 0.4  --   PROT 6.5  --   ALBUMIN 2.3* 1.5*    Recent Labs Lab 01/04/13 1311 01/05/13 0434 01/05/13 0720 01/05/13 1800 01/06/13 0434  WBC 34.9* 24.8* 22.9* 21.0* 19.2*  NEUTROABS 30.9* 22.0*  --   --   --   HGB 9.0* 6.8* 6.9* 8.1* 8.4*  HCT 27.0* 20.4*  20.8* 24.5* 25.3*  MCV 85.4 85.4 84.9 86.0 86.1  PLT 404* 345 337 304 328   . amiodarone  100 mg Oral Daily  . darbepoetin (ARANESP) injection - NON-DIALYSIS  100 mcg Subcutaneous Q Fri-1800  . gabapentin  100 mg Oral BID  . hydrocortisone  25 mg Rectal BID  . lactose free nutrition  237 mL Oral BID BM  . levofloxacin (LEVAQUIN) IV  500 mg Intravenous Q48H  . levothyroxine  50 mcg Oral QAC breakfast  . lidocaine   Topical TID  . pantoprazole (PROTONIX) IV  40 mg Intravenous Once  . rOPINIRole  0.25 mg Oral QHS  . sevelamer carbonate  2,400 mg Oral TID WC  . simvastatin  10 mg Oral QHS  . sodium chloride  3 mL Intravenous Q12H  . sodium chloride  3 mL Intravenous Q12H   . sodium chloride 65 mL/hr (01/05/13 1816)  . dialysis solution 1.5% low-MG/low-CA     sodium chloride, acetaminophen, acetaminophen, ondansetron (ZOFRAN) IV, ondansetron, potassium chloride, sodium chloride

## 2013-01-06 NOTE — Progress Notes (Signed)
NURSING PROGRESS NOTE  Valerie Yoder [id number] Transfer Data: 01/06/2013 8:09 PM Attending Provider: Jonah Blue, DO WUJ:WJXB,JYN, NP Code Status: Full  Valerie Yoder is a 79 y.o. female. -No acute distress noted.  -No complaints of shortness of breath.  -No complaints of chest pain.   Cardiac Monitoring: in place. Cardiac monitor yields:normal sinus rhythm.  Blood pressure 100/60, pulse 69, temperature 98.1 F (36.7 C), temperature source Oral, resp. rate 16, height 5\' 3"  (1.6 m), weight 57.516 kg (126 lb 12.8 oz), SpO2 96.00%.   Allergies:  Latex; Other; Penicillins; and Sulfa antibiotics  Past Medical History:   has a past medical history of Hypertension; Diabetes mellitus; Renal failure; Mitral valve prolapse; Hyperlipidemia; CHF (congestive heart failure); Atrial fibrillation; Arthritis; and Cancer (07/02/11).  Past Surgical History:   has past surgical history that includes Nephrectomy; Abdominal hysterectomy; Appendectomy; Tonsillectomy; AV fistula placement (2010); and Peritoneal catheter insertion (2011).  Social History:   reports that she has never smoked. She does not have any smokeless tobacco history on file. She reports that she does not drink alcohol or use illicit drugs.  Patient/Family orientated to room. Information packet given to patient/family. Admission inpatient armband information verified with patient/family to include name and date of birth and placed on patient arm. Side rails up x 2, fall assessment and education completed with patient/family. Patient/family able to verbalize understanding of risk associated with falls and verbalized understanding to call for assistance before getting out of bed. Call light within reach. Patient/family able to voice and demonstrate understanding of unit orientation instructions.    Will continue to evaluate and treat per MD orders.  Peri Maris, MBA, BS, RN

## 2013-01-06 NOTE — Progress Notes (Signed)
Subjective: Pt doing well today other than complaining that her mouth is dry. No significant bowel movement as of yet since enema. She denies abdominal pain.   Objective: Vital signs in last 24 hours: Filed Vitals:   01/05/13 2351 01/06/13 0418 01/06/13 0715 01/06/13 1400  BP: 98/55 105/58 97/54 100/60  Pulse: 77 72 73 69  Temp: 97.7 F (36.5 C) 98.3 F (36.8 C) 98.8 F (37.1 C) 98.1 F (36.7 C)  TempSrc: Oral Oral Oral Oral  Resp: 13 13 15 16   Height:  5\' 3"  (1.6 m)    Weight:  126 lb 12.8 oz (57.516 kg)    SpO2: 97% 99% 96% 96%   Weight change: 6 lb 13.9 oz (3.116 kg)  Intake/Output Summary (Last 24 hours) at 01/06/13 1754 Last data filed at 01/06/13 1027  Gross per 24 hour  Intake 1620.25 ml  Output      0 ml  Net 1620.25 ml   Vitals reviewed. General: resting in bed, NAD HEENT: [place]/at, dry oral mucosa, no scleral icterus Cardiac: RRR, no rubs, murmurs or gallops Pulm: clear to auscultation bilaterally, no wheezes, rales, or rhonchi Abd: soft, nontender, distended, PD cath in place w/o sig drainage, BS present Ext: warm and well perfused, no pedal edema Neuro: alert and oriented X3, cranial nerves II-XII grossly intact  Lab Results: Basic Metabolic Panel:  Recent Labs Lab 01/05/13 0434 01/06/13 1245  NA 127* 128*  K 3.1* 3.9  CL 89* 92*  CO2 22 23  GLUCOSE 97 143*  BUN 41* 31*  CREATININE 5.52* 4.54*  CALCIUM 8.1* 7.9*  MG 1.5  --   PHOS 3.8 2.2*   Liver Function Tests:  Recent Labs Lab 01/04/13 1311 01/05/13 0434 01/06/13 1245  AST 20  --   --   ALT 12  --   --   ALKPHOS 153*  --   --   BILITOT 0.4  --   --   PROT 6.5  --   --   ALBUMIN 2.3* 1.5* 1.5*    Recent Labs Lab 01/04/13 1311  LIPASE 17   CBC:  Recent Labs Lab 01/05/13 0434  01/06/13 0434 01/06/13 1245  WBC 24.8*  < > 19.2* 15.6*  NEUTROABS 22.0*  --   --  13.3*  HGB 6.8*  < > 8.4* 8.5*  HCT 20.4*  < > 25.3* 25.7*  MCV 85.4  < > 86.1 86.8  PLT 345  < > 328 347  <  > = values in this interval not displayed. Cardiac Enzymes:  Recent Labs Lab 01/04/13 0013 01/05/13 0434 01/05/13 1200  TROPONINI <0.30 <0.30 <0.30   CBG:  Recent Labs Lab 01/04/13 2142 01/05/13 0346 01/05/13 0749 01/05/13 1200 01/05/13 2211 01/06/13 0831  GLUCAP 102* 125* 135* 148* 145* 109*   Coagulation:  Recent Labs Lab 01/04/13 1311  LABPROT 14.6  INR 1.16   Anemia Panel:  Recent Labs Lab 01/06/13 0434  TIBC 107*  IRON 59     Misc. Labs: Peritoneal fluid labs, anemia panel   Micro Results: Recent Results (from the past 240 hour(s))  BODY FLUID CULTURE     Status: None   Collection Time    01/04/13  4:12 PM      Result Value Range Status   Specimen Description PERITONEAL DIALYSATE   Final   Special Requests NONE   Final   Gram Stain     Final   Value: NO WBC SEEN     NO ORGANISMS  SEEN     Performed at [facility]     Final   Value: NO GROWTH 2 DAYS     Performed at [facility]   Report Status PENDING   Incomplete  CULTURE, BLOOD (ROUTINE X 2)     Status: None   Collection Time    01/04/13  6:05 PM      Result Value Range Status   Specimen Description BLOOD HAND RIGHT   Final   Special Requests BOTTLES DRAWN AEROBIC ONLY 3CC   Final   Culture  Setup Time     Final   Value: 01/04/2013 22:14     Performed at [facility]   Culture     Final   Value:        BLOOD CULTURE RECEIVED NO GROWTH TO DATE CULTURE WILL BE HELD FOR 5 DAYS BEFORE ISSUING A FINAL NEGATIVE REPORT     Performed at [facility]   Report Status PENDING   Incomplete  CULTURE, BLOOD (ROUTINE X 2)     Status: None   Collection Time    01/04/13  7:00 PM      Result Value Range Status   Specimen Description BLOOD HAND LEFT   Final   Special Requests BOTTLES DRAWN AEROBIC ONLY 5CC   Final   Culture  Setup Time     Final   Value: 01/05/2013 03:58     Performed at [facility]   Culture     Final   Value:        BLOOD CULTURE  RECEIVED NO GROWTH TO DATE CULTURE WILL BE HELD FOR 5 DAYS BEFORE ISSUING A FINAL NEGATIVE REPORT     Performed at [facility]   Report Status PENDING   Incomplete  MRSA PCR SCREENING     Status: None   Collection Time    01/04/13 10:12 PM      Result Value Range Status   MRSA by PCR NEGATIVE  NEGATIVE Final   Comment:            The GeneXpert MRSA Assay (FDA     approved for NASAL specimens     only), is one component of a     comprehensive MRSA colonization     surveillance program. It is not     intended to diagnose MRSA     infection nor to guide or     monitor treatment for     MRSA infections.  CLOSTRIDIUM DIFFICILE BY PCR     Status: None   Collection Time    01/05/13  2:52 AM      Result Value Range Status   C difficile by pcr NEGATIVE  NEGATIVE Final   Studies/Results: Dg Chest Port 1 View  01/05/2013   CLINICAL DATA:  Central line placement.  EXAM: PORTABLE CHEST - 1 VIEW  COMPARISON:  04/24/2012.  FINDINGS: Left IJ catheter noted with tip projected over superior vena cava. Mild atelectasis left lung base. Lungs are clear. No pneumothorax. Heart size normal.  IMPRESSION: 1. Interim placement of left IJ line, its tip is projected over superior vena cava. 2. Mild subsegmental atelectasis left lung base.   Electronically Signed   By: Maisie Fus  Register   On: 01/05/2013 14:09   Medications:  Scheduled Meds: . amiodarone  100 mg Oral Daily  . darbepoetin (ARANESP) injection - NON-DIALYSIS  100 mcg Subcutaneous Q Fri-1800  . gabapentin  100 mg Oral  BID  . hydrocortisone  25 mg Rectal BID  . lactose free nutrition  237 mL Oral BID BM  . levofloxacin (LEVAQUIN) IV  500 mg Intravenous Q48H  . levothyroxine  50 mcg Oral QAC breakfast  . lidocaine   Topical TID  . pantoprazole  40 mg Oral Daily  . rOPINIRole  0.25 mg Oral QHS  . sevelamer carbonate  2,400 mg Oral TID WC  . simvastatin  10 mg Oral QHS  . sodium chloride  3 mL Intravenous Q12H  . sodium chloride  3 mL  Intravenous Q12H   Continuous Infusions: . sodium chloride 75 mL/hr at 01/06/13 1407  . dialysis solution 1.5% low-MG/low-CA     PRN Meds:.sodium chloride, acetaminophen, acetaminophen, ondansetron (ZOFRAN) IV, ondansetron, potassium chloride, sodium chloride Assessment/Plan: 78 y.o [name] PMH HTN, DM, ESRD on PD, MVP, HLD, CHF, Atrial fibrillation, arthritis, skin cancer presented 11/20 for fever (Tmax 102 F at home) and rectal pain after trying to disimpact herself prior to admission.  She was found to have leukocytosis on admission and concern for peritonitis.    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  Possibly cath related peritonitis -Continuing Abx with improvement in her WBC ct.  She does not have ab pain.    -renal following   #Leukocytosis  -Could be 2/2 concern for peritonitis.  Currently this is the only source.  C. Diff negative. WBC trending down with Abx    #End stage renal disease on peritoneal dialysis with metabolic bone disease -Renal consulted and following  -still getting PD daily  -trend renal function panel  -continue renvela  #Normocytic anemia -Hemoglobin as low as 6.8 this admission trended up to 8.5 s/p 1 unit pRBCs.  -per renal given Aranesp  -anemia panel pending  -GI follow up outpatient   #Hypotension with history of HTN -BP improved today.  Could be secondary to volume depletion with diarrhea, decreased po intake -Continue to monitor   #Overflow diarrhea/proctitis  -GI consulted  -given SMOG enema per GI though no significant BM as of yet   #Chronic Atrial fibrillation  -rate controlled. Continue Amiodarone  #DM history (no HA1C on file) -monitor cbgs, no SSI as of yet   #Fecal occult blood test positive -GI will do outpatient w/u in the future     #F/E/N -prn 500 cc boluses for MAP goal of >60  -Asymptomatic hyponatremia with h/o hyponatremia, renal  function panel in the am  -renal diet     Dispo: Disposition is deferred at this time, awaiting improvement of current medical problems.  Anticipated discharge in approximately 2-3 day(s).   The patient does have a current PCP (Amy Moon, NP) and does not need an [facility] hospital follow-up appointment after discharge.  The patient does not have transportation limitations that hinder transportation to clinic appointments.  .Services Needed at time of discharge: Y = Yes, Blank = No PT:   OT:   RN:   Equipment:   Other:     LOS: 2 days   Annett Gula, MD [phone] 01/06/2013, 5:54 PM

## 2013-01-07 DIAGNOSIS — K658 Other peritonitis: Secondary | ICD-10-CM

## 2013-01-07 DIAGNOSIS — K659 Peritonitis, unspecified: Secondary | ICD-10-CM

## 2013-01-07 DIAGNOSIS — R195 Other fecal abnormalities: Secondary | ICD-10-CM

## 2013-01-07 LAB — BODY FLUID CULTURE
Culture: NO GROWTH
Gram Stain: NONE SEEN

## 2013-01-07 LAB — CBC WITH DIFFERENTIAL/PLATELET
Basophils Absolute: 0 10*3/uL (ref 0.0–0.1)
Eosinophils Absolute: 0.4 10*3/uL (ref 0.0–0.7)
Lymphocytes Relative: 12 % (ref 12–46)
Lymphs Abs: 1.5 10*3/uL (ref 0.7–4.0)
Monocytes Relative: 8 % (ref 3–12)
Neutro Abs: 9.4 10*3/uL — ABNORMAL HIGH (ref 1.7–7.7)
Neutrophils Relative %: 77 % (ref 43–77)
Platelets: 359 10*3/uL (ref 150–400)
RBC: 2.92 MIL/uL — ABNORMAL LOW (ref 3.87–5.11)
WBC: 12.2 10*3/uL — ABNORMAL HIGH (ref 4.0–10.5)

## 2013-01-07 LAB — RENAL FUNCTION PANEL
Albumin: 1.6 g/dL — ABNORMAL LOW (ref 3.5–5.2)
CO2: 23 mEq/L (ref 19–32)
Calcium: 7.8 mg/dL — ABNORMAL LOW (ref 8.4–10.5)
Chloride: 97 mEq/L (ref 96–112)
GFR calc Af Amer: 10 mL/min — ABNORMAL LOW (ref >90)
GFR calc non Af Amer: 9 mL/min — ABNORMAL LOW (ref >90)
Sodium: 130 mEq/L — ABNORMAL LOW (ref 135–145)

## 2013-01-07 LAB — OSMOLALITY: Osmolality: 276 mOsm/kg (ref 275–300)

## 2013-01-07 LAB — GLUCOSE, CAPILLARY

## 2013-01-07 MED ORDER — HEPARIN SODIUM (PORCINE) 1000 UNIT/ML IJ SOLN
3000.0000 [IU] | INTRAMUSCULAR | Status: AC | PRN
  Administered 2013-01-07 – 2013-01-09 (×2): 3000 [IU] via INTRAPERITONEAL
  Filled 2013-01-07 (×3): qty 3

## 2013-01-07 MED ORDER — POLYETHYLENE GLYCOL 3350 17 G PO PACK
17.0000 g | PACK | Freq: Every day | ORAL | Status: DC | PRN
  Filled 2013-01-07: qty 1

## 2013-01-07 MED ORDER — HEPARIN SODIUM (PORCINE) 1000 UNIT/ML IJ SOLN
1000.0000 [IU] | INTRAMUSCULAR | Status: DC
  Filled 2013-01-07: qty 1

## 2013-01-07 MED ORDER — HEPARIN SODIUM (PORCINE) 1000 UNIT/ML IJ SOLN
1500.0000 [IU] | INTRAMUSCULAR | Status: DC
  Filled 2013-01-07: qty 1.5

## 2013-01-07 MED ORDER — DOCUSATE SODIUM 100 MG PO CAPS
100.0000 mg | ORAL_CAPSULE | Freq: Two times a day (BID) | ORAL | Status: DC
  Administered 2013-01-07 – 2013-01-09 (×5): 100 mg via ORAL
  Filled 2013-01-07 (×6): qty 1

## 2013-01-07 MED ORDER — HEPARIN SODIUM (PORCINE) 1000 UNIT/ML IJ SOLN
1500.0000 [IU] | INTRAMUSCULAR | Status: DC | PRN
  Administered 2013-01-07: 1500 [IU] via INTRAPERITONEAL
  Filled 2013-01-07: qty 1.5

## 2013-01-07 NOTE — Progress Notes (Signed)
[facility] Progress Note    Since last GI note: Solid BM today once.  Ate solid food without n/v pains.  Objective: Vital signs in last 24 hours: Temp:  [97.8 F (36.6 C)-99.1 F (37.3 C)] 97.8 F (36.6 C) (11/23 0820) Pulse Rate:  [69-79] 79 (11/23 0820) Resp:  [16-18] 18 (11/23 0820) BP: (96-114)/(59-68) 114/68 mmHg (11/23 0820) SpO2:  [96 %-97 %] 96 % (11/23 0820) Weight:  [127 lb 13.9 oz (58 kg)] 127 lb 13.9 oz (58 kg) (11/22 2214) Last BM Date: 01/06/13 General: alert and oriented times 3 Heart: regular rate and rythm Abdomen: soft, non-tender, non-distended, normal bowel sounds   Lab Results:  Recent Labs  01/06/13 0434 01/06/13 1245 01/07/13 0628  WBC 19.2* 15.6* 12.2*  HGB 8.4* 8.5* 8.3*  PLT 328 347 359  MCV 86.1 86.8 88.0    Recent Labs  01/05/13 0434 01/06/13 1245 01/07/13 0628  NA 127* 128* 130*  K 3.1* 3.9 4.6  CL 89* 92* 97  CO2 22 23 23   GLUCOSE 97 143* 90  BUN 41* 31* 29*  CREATININE 5.52* 4.54* 4.26*  CALCIUM 8.1* 7.9* 7.8*    Recent Labs  01/04/13 1311 01/05/13 0434 01/06/13 1245 01/07/13 0628  PROT 6.5  --   --   --   ALBUMIN 2.3* 1.5* 1.5* 1.6*  AST 20  --   --   --   ALT 12  --   --   --   ALKPHOS 153*  --   --   --   BILITOT 0.4  --   --   --     Recent Labs  01/04/13 1311  INR 1.16      Medications: Scheduled Meds: . amiodarone  100 mg Oral Daily  . darbepoetin (ARANESP) injection - NON-DIALYSIS  100 mcg Subcutaneous Q Fri-1800  . docusate sodium  100 mg Oral BID  . gabapentin  100 mg Oral BID  . hydrocortisone  25 mg Rectal BID  . lactose free nutrition  237 mL Oral BID BM  . levofloxacin (LEVAQUIN) IV  500 mg Intravenous Q48H  . levothyroxine  50 mcg Oral QAC breakfast  . lidocaine   Topical TID  . pantoprazole  40 mg Oral Daily  . rOPINIRole  0.25 mg Oral QHS  . simvastatin  10 mg Oral QHS  . sodium chloride  3 mL Intravenous Q12H  . sodium chloride  3 mL Intravenous Q12H   Continuous  Infusions: . dialysis solution 1.5% low-MG/low-CA    . heparin     PRN Meds:.sodium chloride, acetaminophen, acetaminophen, ondansetron (ZOFRAN) IV, ondansetron, sodium chloride    Assessment/Plan: 79 y.o. female constipation that is improving in setting of peritonitis, abnormal rectum on CT  She will need out patient GI follow up in about a month after d/c.  I offered my services however she prefers to follow up with her previous gastroenterologist, Dr. Dan Europe, in [place] since it is so much closer to her home.  She and her family member in room know to call his office next week after d/c to set up appt in 4-5 weeks.  Should consider direct visualization of rectum (flex sig or colonsocopy).    Rachael Fee, MD  01/07/2013, 10:28 AM [facility] Pager [phone]

## 2013-01-07 NOTE — Progress Notes (Signed)
[facility]  Day # 4 vancomycin and levaquin  Subjective: No new complaints   Antibiotics:  Anti-infectives   Start     Dose/Rate Route Frequency Ordered Stop   01/06/13 2000  levofloxacin (LEVAQUIN) IVPB 500 mg     500 mg 100 mL/hr over 60 Minutes Intravenous Every 48 hours 01/04/13 1710     01/04/13 1715  levofloxacin (LEVAQUIN) IVPB 750 mg     750 mg 100 mL/hr over 90 Minutes Intravenous  Once 01/04/13 1706 01/04/13 2237   01/04/13 1715  vancomycin (VANCOCIN) 1,500 mg in sodium chloride 0.9 % 500 mL IVPB     1,500 mg 250 mL/hr over 120 Minutes Intravenous  Once 01/04/13 1706 01/04/13 2021      Medications: Scheduled Meds: . amiodarone  100 mg Oral Daily  . darbepoetin (ARANESP) injection - NON-DIALYSIS  100 mcg Subcutaneous Q Fri-1800  . docusate sodium  100 mg Oral BID  . gabapentin  100 mg Oral BID  . hydrocortisone  25 mg Rectal BID  . lactose free nutrition  237 mL Oral BID BM  . levofloxacin (LEVAQUIN) IV  500 mg Intravenous Q48H  . levothyroxine  50 mcg Oral QAC breakfast  . lidocaine   Topical TID  . pantoprazole  40 mg Oral Daily  . rOPINIRole  0.25 mg Oral QHS  . simvastatin  10 mg Oral QHS  . sodium chloride  3 mL Intravenous Q12H  . sodium chloride  3 mL Intravenous Q12H   Continuous Infusions: . dialysis solution 1.5% low-MG/low-CA     PRN Meds:.sodium chloride, acetaminophen, acetaminophen, heparin, ondansetron (ZOFRAN) IV, ondansetron, polyethylene glycol, sodium chloride   Objective: Weight change: 1 lb 1.1 oz (0.484 kg)  Intake/Output Summary (Last 24 hours) at 01/07/13 1640 Last data filed at 01/07/13 0755  Gross per 24 hour  Intake 1909.58 ml  Output      2 ml  Net 1907.58 ml   Blood pressure 113/54, pulse 64, temperature 97.8 F (36.6 C), temperature source Oral, resp. rate 18, height 5\' 3"  (1.6 m), weight 127 lb 13.9 oz (58 kg), SpO2 96.00%. Temp:  [97.8 F (36.6 C)-99.1 F (37.3 C)] 97.8 F (36.6 C)  (11/23 0820) Pulse Rate:  [64-79] 64 (11/23 1455) Resp:  [16-18] 18 (11/23 0820) BP: (96-114)/(54-68) 113/54 mmHg (11/23 1455) SpO2:  [96 %-97 %] 96 % (11/23 0820) Weight:  [127 lb 13.9 oz (58 kg)] 127 lb 13.9 oz (58 kg) (11/22 2214)  Physical Exam: General: Alert and awake, oriented x3, not in any acute distress, knitting sweater HEENT:  EOMI Abdomen: soft distended, mildly tender  Extremities: no  clubbing or edema noted bilaterally Skin: no rashes Neuro: nonfocal  Lab Results:  Recent Labs  01/06/13 1245 01/07/13 0628  WBC 15.6* 12.2*  HGB 8.5* 8.3*  HCT 25.7* 25.7*  PLT 347 359    BMET  Recent Labs  01/06/13 1245 01/07/13 0628  NA 128* 130*  K 3.9 4.6  CL 92* 97  CO2 23 23  GLUCOSE 143* 90  BUN 31* 29*  CREATININE 4.54* 4.26*  CALCIUM 7.9* 7.8*    Micro Results: Recent Results (from the past 240 hour(s))  BODY FLUID CULTURE     Status: None   Collection Time    01/04/13  4:12 PM      Result Value Range Status   Specimen Description PERITONEAL DIALYSATE   Final   Special Requests NONE   Final   Gram Stain  Final   Value: NO WBC SEEN     NO ORGANISMS SEEN     Performed at [facility]   Culture     Final   Value: NO GROWTH 3 DAYS     Performed at [facility]   Report Status 01/07/2013 FINAL   Final  CULTURE, BLOOD (ROUTINE X 2)     Status: None   Collection Time    01/04/13  6:05 PM      Result Value Range Status   Specimen Description BLOOD HAND RIGHT   Final   Special Requests BOTTLES DRAWN AEROBIC ONLY 3CC   Final   Culture  Setup Time     Final   Value: 01/04/2013 22:14     Performed at [facility]   Culture     Final   Value:        BLOOD CULTURE RECEIVED NO GROWTH TO DATE CULTURE WILL BE HELD FOR 5 DAYS BEFORE ISSUING A FINAL NEGATIVE REPORT     Performed at [facility]   Report Status PENDING   Incomplete  CULTURE, BLOOD (ROUTINE X 2)     Status: None   Collection Time    01/04/13  7:00 PM       Result Value Range Status   Specimen Description BLOOD HAND LEFT   Final   Special Requests BOTTLES DRAWN AEROBIC ONLY 5CC   Final   Culture  Setup Time     Final   Value: 01/05/2013 03:58     Performed at [facility]   Culture     Final   Value:        BLOOD CULTURE RECEIVED NO GROWTH TO DATE CULTURE WILL BE HELD FOR 5 DAYS BEFORE ISSUING A FINAL NEGATIVE REPORT     Performed at [facility]   Report Status PENDING   Incomplete  MRSA PCR SCREENING     Status: None   Collection Time    01/04/13 10:12 PM      Result Value Range Status   MRSA by PCR NEGATIVE  NEGATIVE Final   Comment:            The GeneXpert MRSA Assay (FDA     approved for NASAL specimens     only), is one component of a     comprehensive MRSA colonization     surveillance program. It is not     intended to diagnose MRSA     infection nor to guide or     monitor treatment for     MRSA infections.  CLOSTRIDIUM DIFFICILE BY PCR     Status: None   Collection Time    01/05/13  2:52 AM      Result Value Range Status   C difficile by pcr NEGATIVE  NEGATIVE Final    Studies/Results: No results found.    Assessment/Plan: Valerie Yoder is a 79 y.o. female with  PD catheter related peritonitis  #1 PD related peritonitis: Cultures with NGTD  --I would recommend continuing the levaquin Q 48 hours and can be changed to PO  --And one could also continue vancomycin while in house  and even consider giving it via her PD catheter (which presumably she could get at home)  alternatively if PD infusion of vancomycin not feasible could substitute doxycyline to cover COag negative staph for when she goes home. Bacillus should be S to Vanco and FQ if it were to be a culprit  again  I would treat her for a total of 6 more days and then stop   I will sign off for now  Please call with further questions.     LOS: 3 days   Acey Lav 01/07/2013, 4:40 PM

## 2013-01-07 NOTE — Progress Notes (Signed)
Subjective: Pt still constipated though had 1 good stool this am.  PD cath is clotted per RN. Pt denies abdominal pain.   Objective: Vital signs in last 24 hours: Filed Vitals:   01/06/13 1400 01/06/13 2214 01/07/13 0514 01/07/13 0820  BP: 100/60 102/61 96/59 114/68  Pulse: 69 79 73 79  Temp: 98.1 F (36.7 C) 98 F (36.7 C) 99.1 F (37.3 C) 97.8 F (36.6 C)  TempSrc: Oral Oral Oral Oral  Resp: 16 16 17 18   Height:      Weight:  127 lb 13.9 oz (58 kg)    SpO2: 96% 97% 96% 96%   Weight change: 1 lb 1.1 oz (0.484 kg)  Intake/Output Summary (Last 24 hours) at 01/07/13 1322 Last data filed at 01/07/13 0755  Gross per 24 hour  Intake 1909.58 ml  Output      2 ml  Net 1907.58 ml   Vitals reviewed. General: on the bedside commode, NAD HEENT: [place]/at, no scleral icterus Cardiac: RRR, no rubs, murmurs or gallops Pulm: clear to auscultation bilaterally, no wheezes, rales, or rhonchi Abd: soft, nontender, distended, PD cath in place w/o sig drainage, BS present Ext: warm and well perfused, no pedal edema Neuro: alert and oriented X3, cranial nerves II-XII grossly intact  Lab Results: Basic Metabolic Panel:  Recent Labs Lab 01/05/13 0434 01/06/13 1245 01/07/13 0628  NA 127* 128* 130*  K 3.1* 3.9 4.6  CL 89* 92* 97  CO2 22 23 23   GLUCOSE 97 143* 90  BUN 41* 31* 29*  CREATININE 5.52* 4.54* 4.26*  CALCIUM 8.1* 7.9* 7.8*  MG 1.5  --   --   PHOS 3.8 2.2* 1.9*   Liver Function Tests:  Recent Labs Lab 01/04/13 1311  01/06/13 1245 01/07/13 0628  AST 20  --   --   --   ALT 12  --   --   --   ALKPHOS 153*  --   --   --   BILITOT 0.4  --   --   --   PROT 6.5  --   --   --   ALBUMIN 2.3*  < > 1.5* 1.6*  < > = values in this interval not displayed.  Recent Labs Lab 01/04/13 1311  LIPASE 17   CBC:  Recent Labs Lab 01/06/13 1245 01/07/13 0628  WBC 15.6* 12.2*  NEUTROABS 13.3* 9.4*  HGB 8.5* 8.3*  HCT 25.7* 25.7*  MCV 86.8 88.0  PLT 347 359   Cardiac  Enzymes:  Recent Labs Lab 01/04/13 0013 01/05/13 0434 01/05/13 1200  TROPONINI <0.30 <0.30 <0.30   CBG:  Recent Labs Lab 01/05/13 0346 01/05/13 0749 01/05/13 1200 01/05/13 2211 01/06/13 0831 01/07/13 0809  GLUCAP 125* 135* 148* 145* 109* 105*   Coagulation:  Recent Labs Lab 01/04/13 1311  LABPROT 14.6  INR 1.16   Anemia Panel:  Recent Labs Lab 01/06/13 0434  FERRITIN 1851*  TIBC 107*  IRON 59     Misc. Labs: Peritoneal fluid labs, anemia panel   Micro Results: Recent Results (from the past 240 hour(s))  BODY FLUID CULTURE     Status: None   Collection Time    01/04/13  4:12 PM      Result Value Range Status   Specimen Description PERITONEAL DIALYSATE   Final   Special Requests NONE   Final   Gram Stain     Final   Value: NO WBC SEEN     NO ORGANISMS SEEN  Performed at [facility]     Final   Value: NO GROWTH 2 DAYS     Performed at [facility]   Report Status PENDING   Incomplete  CULTURE, BLOOD (ROUTINE X 2)     Status: None   Collection Time    01/04/13  6:05 PM      Result Value Range Status   Specimen Description BLOOD HAND RIGHT   Final   Special Requests BOTTLES DRAWN AEROBIC ONLY 3CC   Final   Culture  Setup Time     Final   Value: 01/04/2013 22:14     Performed at [facility]   Culture     Final   Value:        BLOOD CULTURE RECEIVED NO GROWTH TO DATE CULTURE WILL BE HELD FOR 5 DAYS BEFORE ISSUING A FINAL NEGATIVE REPORT     Performed at [facility]   Report Status PENDING   Incomplete  CULTURE, BLOOD (ROUTINE X 2)     Status: None   Collection Time    01/04/13  7:00 PM      Result Value Range Status   Specimen Description BLOOD HAND LEFT   Final   Special Requests BOTTLES DRAWN AEROBIC ONLY 5CC   Final   Culture  Setup Time     Final   Value: 01/05/2013 03:58     Performed at [facility]   Culture     Final   Value:        BLOOD CULTURE RECEIVED NO GROWTH TO DATE CULTURE  WILL BE HELD FOR 5 DAYS BEFORE ISSUING A FINAL NEGATIVE REPORT     Performed at [facility]   Report Status PENDING   Incomplete  MRSA PCR SCREENING     Status: None   Collection Time    01/04/13 10:12 PM      Result Value Range Status   MRSA by PCR NEGATIVE  NEGATIVE Final   Comment:            The GeneXpert MRSA Assay (FDA     approved for NASAL specimens     only), is one component of a     comprehensive MRSA colonization     surveillance program. It is not     intended to diagnose MRSA     infection nor to guide or     monitor treatment for     MRSA infections.  CLOSTRIDIUM DIFFICILE BY PCR     Status: None   Collection Time    01/05/13  2:52 AM      Result Value Range Status   C difficile by pcr NEGATIVE  NEGATIVE Final   Studies/Results: Dg Chest Port 1 View  01/05/2013   CLINICAL DATA:  Central line placement.  EXAM: PORTABLE CHEST - 1 VIEW  COMPARISON:  04/24/2012.  FINDINGS: Left IJ catheter noted with tip projected over superior vena cava. Mild atelectasis left lung base. Lungs are clear. No pneumothorax. Heart size normal.  IMPRESSION: 1. Interim placement of left IJ line, its tip is projected over superior vena cava. 2. Mild subsegmental atelectasis left lung base.   Electronically Signed   By: Maisie Fus  Register   On: 01/05/2013 14:09   Medications:  Scheduled Meds: . amiodarone  100 mg Oral Daily  . darbepoetin (ARANESP) injection - NON-DIALYSIS  100 mcg Subcutaneous Q Fri-1800  . docusate sodium  100 mg Oral BID  . gabapentin  100 mg Oral BID  . hydrocortisone  25 mg Rectal BID  . lactose free nutrition  237 mL Oral BID BM  . levofloxacin (LEVAQUIN) IV  500 mg Intravenous Q48H  . levothyroxine  50 mcg Oral QAC breakfast  . lidocaine   Topical TID  . pantoprazole  40 mg Oral Daily  . rOPINIRole  0.25 mg Oral QHS  . simvastatin  10 mg Oral QHS  . sodium chloride  3 mL Intravenous Q12H  . sodium chloride  3 mL Intravenous Q12H   Continuous  Infusions: . dialysis solution 1.5% low-MG/low-CA     PRN Meds:.sodium chloride, acetaminophen, acetaminophen, heparin, ondansetron (ZOFRAN) IV, ondansetron, sodium chloride Assessment/Plan: 79 y.o Valerie Yoder PMH HTN, DM, ESRD on PD, MVP, HLD, CHF, Atrial fibrillation, arthritis, skin cancer presented 11/20 for fever (Tmax 102 F at home) and rectal pain after trying to disimpact herself prior to admission.  She was found to have leukocytosis on admission and concern for peritonitis.    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  Possibly cath related peritonitis -Continuing Abx (Vancomycin, Levaquin per pharm) with improvement in her WBC ct.  She does not have ab pain.    -renal following  -so far Cultures NTD   #Leukocytosis  -Could be 2/2 concern for peritonitis.  Currently this is the only source.  C. Diff negative. WBC trending down with Abx    #End stage renal disease on peritoneal dialysis with metabolic bone disease -Renal consulted and following  -still getting PD daily  -trend renal function panel  -continue renvela  #Normocytic anemia -Hemoglobin as low as 6.8 this admission trended up to 8.3 s/p 1 unit pRBCs this admission  -per renal given Aranesp  -anemia panel indicates chronic disease   -GI follow up outpatient for FOBT + this admission. Per GI needs f/u 1 month after d/c pt prefers with previous gastroenterologist, Dr. Dan Europe, in [place]. He should consider direct visualization of rectum (flex sig or colonsocopy  #Hypotension with history of HTN -BP stable.  Could be secondary to volume depletion with diarrhea, decreased po intake. Per family baseline is 80s to 90s sbp -Continue to monitor   #Overflow diarrhea/proctitis/constipation -GI consulted  -given SMOG enema per GI with 1 stool today  -Colace 100 mg bid, added Miralax, discussed prune juice (pt eating prunes)  #Chronic  Atrial fibrillation  -rate controlled. Continue Amiodarone  #DM history (no HA1C on file) -monitor cbgs, no SSI as of yet   #Fecal occult blood test positive -GI will do outpatient w/u in the future     #F/E/N -prn 500 cc boluses for MAP goal of >60  -Asymptomatic hyponatremia with h/o hyponatremia (Na improving today), renal function panel in the am  -renal diet     Dispo: Disposition is deferred at this time, awaiting improvement of current medical problems.  Anticipated discharge in approximately 1-2 day(s).   The patient does have a current PCP (Amy Moon, NP) and does not need an [facility] hospital follow-up appointment after discharge.  The patient does not have transportation limitations that hinder transportation to clinic appointments.  .Services Needed at time of discharge: Y = Yes, Blank = No PT:   OT:   RN:   Equipment:   Other:     LOS: 3 days   Annett Gula, MD [phone] 01/07/2013, 1:22 PM

## 2013-01-07 NOTE — Progress Notes (Signed)
[facility] Progress Note  Subjective:   No significant BM since SMOG enema. Abdominal soreness  Leukocytosis and cell count improving.  Objective Filed Vitals:   01/06/13 0715 01/06/13 1400 01/06/13 2214 01/07/13 0514  BP: 97/54 100/60 102/61 96/59  Pulse: 73 69 79 73  Temp: 98.8 F (37.1 C) 98.1 F (36.7 C) 98 F (36.7 C) 99.1 F (37.3 C)  TempSrc: Oral Oral Oral Oral  Resp: 15 16 16 17   Height:      Weight:   58 kg (127 lb 13.9 oz)   SpO2: 96% 96% 97% 96%   Physical Exam General: Elderly, cooperative, NAD Heart: RRR, no murmur or rub Lungs: CTA bilat, no wheezes or rhonchi noted Abdomen: soft, distended, mildly tender RLQ and near PD catheter exit site, + BS Extremities: SCDs present. No LE edema Dialysis Access: PD cath LLQ  Dialysis Orders: [facility] [place]  Hangs 5L 1.5% and 5L 2.5%, does pause then 3 overnight cycles of and leaves a day bag of also. 5 cycles total  Assessment/Plan: 1. Peritonitis: likely catheter-related, cont IV vanc and levaquin, daily cell count improving, cx neg to date 2. Hx Bacillus peritonitis Sept '14, treated, cath not removed 3. ESRD: CAPD 5x/day with low dextrose concentration. Fibrin noted per RN - Heparin added to dialysate. K + 4.6. Will d/c supplement 4. HTN/Vol: Soft SBPs despite fluid bolus. IVF d/c'd. No BP meds. 5. Anemia of CKD - Hgb 8.3 s/p prbc's, Aranesp 100 q Fri , Tsat 55% with Ferritin 1851 this admit. Follow CBC 6. MBD (metabolic bone disease) - Ca 7.8 (10 corrected) Phos 1.9. Will hold renvela 3 ac until eating better. 7. Nutrition. Alb 1.6 < 2.3 on admit. Poor po intake contributing. Will liberalize diet. 8. Constipation: per GI, primary   Scot Jun. Thad Ranger [facility] Pager [phone] 01/07/2013,9:26 AM  LOS: 3 days   I have seen and examined patient, discussed with PA and agree with assessment and plan as outlined above. Vinson Moselle MD pager [phone]    cell  [phone] 01/07/2013, 3:07 PM     Additional Objective Labs: Basic Metabolic Panel:  Recent Labs Lab 01/05/13 0434 01/06/13 1245 01/07/13 0628  NA 127* 128* 130*  K 3.1* 3.9 4.6  CL 89* 92* 97  CO2 22 23 23   GLUCOSE 97 143* 90  BUN 41* 31* 29*  CREATININE 5.52* 4.54* 4.26*  CALCIUM 8.1* 7.9* 7.8*  PHOS 3.8 2.2* 1.9*   Liver Function Tests:  Recent Labs Lab 01/04/13 1311 01/05/13 0434 01/06/13 1245 01/07/13 0628  AST 20  --   --   --   ALT 12  --   --   --   ALKPHOS 153*  --   --   --   BILITOT 0.4  --   --   --   PROT 6.5  --   --   --   ALBUMIN 2.3* 1.5* 1.5* 1.6*    Recent Labs Lab 01/04/13 1311  LIPASE 17   CBC:  Recent Labs Lab 01/05/13 0434 01/05/13 0720 01/05/13 1800 01/06/13 0434 01/06/13 1245 01/07/13 0628  WBC 24.8* 22.9* 21.0* 19.2* 15.6* 12.2*  NEUTROABS 22.0*  --   --   --  13.3* 9.4*  HGB 6.8* 6.9* 8.1* 8.4* 8.5* 8.3*  HCT 20.4* 20.8* 24.5* 25.3* 25.7* 25.7*  MCV 85.4 84.9 86.0 86.1 86.8 88.0  PLT 345 337 304 328 347 359   Blood Culture    Component Value  Date/Time   SDES BLOOD HAND LEFT 01/04/2013 1900   SPECREQUEST BOTTLES DRAWN AEROBIC ONLY 5CC 01/04/2013 1900   CULT  Value:        BLOOD CULTURE RECEIVED NO GROWTH TO DATE CULTURE WILL BE HELD FOR 5 DAYS BEFORE ISSUING A FINAL NEGATIVE REPORT Performed at [facility] 01/04/2013 1900   REPTSTATUS PENDING 01/04/2013 1900    Cardiac Enzymes:  Recent Labs Lab 01/04/13 0013 01/05/13 0434 01/05/13 1200  TROPONINI <0.30 <0.30 <0.30   CBG:  Recent Labs Lab 01/05/13 0749 01/05/13 1200 01/05/13 2211 01/06/13 0831 01/07/13 0809  GLUCAP 135* 148* 145* 109* 105*   Iron Studies:   Recent Labs  01/06/13 0434  IRON 59  TIBC 107*  FERRITIN 1851*    Studies/Results: Dg Chest Port 1 View  01/05/2013   CLINICAL DATA:  Central line placement.  EXAM: PORTABLE CHEST - 1 VIEW  COMPARISON:  04/24/2012.  FINDINGS: Left IJ catheter noted with tip projected over  superior vena cava. Mild atelectasis left lung base. Lungs are clear. No pneumothorax. Heart size normal.  IMPRESSION: 1. Interim placement of left IJ line, its tip is projected over superior vena cava. 2. Mild subsegmental atelectasis left lung base.   Electronically Signed   By: Maisie Fus  Register   On: 01/05/2013 14:09   Medications: . dialysis solution 1.5% low-MG/low-CA    . heparin     . amiodarone  100 mg Oral Daily  . darbepoetin (ARANESP) injection - NON-DIALYSIS  100 mcg Subcutaneous Q Fri-1800  . gabapentin  100 mg Oral BID  . hydrocortisone  25 mg Rectal BID  . lactose free nutrition  237 mL Oral BID BM  . levofloxacin (LEVAQUIN) IV  500 mg Intravenous Q48H  . levothyroxine  50 mcg Oral QAC breakfast  . lidocaine   Topical TID  . pantoprazole  40 mg Oral Daily  . rOPINIRole  0.25 mg Oral QHS  . simvastatin  10 mg Oral QHS  . sodium chloride  3 mL Intravenous Q12H  . sodium chloride  3 mL Intravenous Q12H

## 2013-01-07 NOTE — Progress Notes (Signed)
Vancomycin per Rx  ID: Vanc/levaquin D#4 empiric for unknown source of infxn, presumably bacterial peritonitis/proctitis?- Afebrile, WBC down to 12.2 - pt on chronic PD (5x /day)  Vanc 11/20>> Levaquin 11/20>>  11/20 Blood>> NGTD 11/20 Peritoneal fluid>> NGTD 11/21 Cdiff>> neg  Goal vancomycin random level < 20  Renal: ESRD- k 3.1.  On CAPD 5x/day with low dextrose concentration. K good at 4.6; Na 130.   Plan: Levaquin 500mg  IV q48h (prev dose 11/20) Vancomycin previous dose 11/20 Obtain serum vanc 11/24 and redose when level <20

## 2013-01-08 DIAGNOSIS — D649 Anemia, unspecified: Secondary | ICD-10-CM

## 2013-01-08 DIAGNOSIS — I959 Hypotension, unspecified: Secondary | ICD-10-CM

## 2013-01-08 LAB — GLUCOSE, CAPILLARY: Glucose-Capillary: 123 mg/dL — ABNORMAL HIGH (ref 70–99)

## 2013-01-08 LAB — CBC WITH DIFFERENTIAL/PLATELET
Basophils Absolute: 0 10*3/uL (ref 0.0–0.1)
Eosinophils Relative: 3 % (ref 0–5)
HCT: 25.2 % — ABNORMAL LOW (ref 36.0–46.0)
Hemoglobin: 8.2 g/dL — ABNORMAL LOW (ref 12.0–15.0)
Lymphocytes Relative: 13 % (ref 12–46)
Lymphs Abs: 1.5 10*3/uL (ref 0.7–4.0)
Monocytes Absolute: 0.9 10*3/uL (ref 0.1–1.0)
Neutro Abs: 8.2 10*3/uL — ABNORMAL HIGH (ref 1.7–7.7)
RBC: 2.84 MIL/uL — ABNORMAL LOW (ref 3.87–5.11)
RDW: 16.6 % — ABNORMAL HIGH (ref 11.5–15.5)
WBC: 10.9 10*3/uL — ABNORMAL HIGH (ref 4.0–10.5)

## 2013-01-08 LAB — RENAL FUNCTION PANEL
Albumin: 1.6 g/dL — ABNORMAL LOW (ref 3.5–5.2)
BUN: 29 mg/dL — ABNORMAL HIGH (ref 6–23)
CO2: 22 mEq/L (ref 19–32)
Calcium: 8.1 mg/dL — ABNORMAL LOW (ref 8.4–10.5)
Creatinine, Ser: 4.75 mg/dL — ABNORMAL HIGH (ref 0.50–1.10)
GFR calc non Af Amer: 8 mL/min — ABNORMAL LOW (ref >90)
Sodium: 129 mEq/L — ABNORMAL LOW (ref 135–145)

## 2013-01-08 MED ORDER — ENSURE COMPLETE PO LIQD
237.0000 mL | Freq: Once | ORAL | Status: AC
  Administered 2013-01-08: 237 mL via ORAL

## 2013-01-08 MED ORDER — DELFLEX-LC/1.5% DEXTROSE 346 MOSM/L IP SOLN
INTRAPERITONEAL | Status: DC
  Administered 2013-01-08: 18:00:00 via INTRAPERITONEAL

## 2013-01-08 MED ORDER — VANCOMYCIN HCL 1000 MG IV SOLR
750.0000 mg | Freq: Once | INTRAVENOUS | Status: AC
  Administered 2013-01-08: 750 mg via INTRAVENOUS
  Filled 2013-01-08: qty 750

## 2013-01-08 MED ORDER — SODIUM CHLORIDE 0.9 % IJ SOLN
10.0000 mL | INTRAMUSCULAR | Status: DC | PRN
  Administered 2013-01-08 (×4): 10 mL
  Administered 2013-01-08: 30 mL

## 2013-01-08 NOTE — Progress Notes (Signed)
Valerie Yoder Progress Note  Subjective:   Didn't sleep well, feels bad. Can't eat food without salt. Appetite poor. Intermittent involuntary leakage of stool unsetting to her.    Objective Filed Vitals:   01/07/13 1455 01/07/13 1841 01/07/13 2029 01/08/13 0607  BP: 113/54 118/64 100/66 96/57  Pulse: 64 78 76 69  Temp:  97.2 F (36.2 C) 98.6 F (37 C) 98.5 F (36.9 C)  TempSrc:  Oral Oral Oral  Resp:  18 18 17   Height:      Weight:   61.8 kg (136 lb 3.9 oz)   SpO2:  96% 97% 98%   Physical Exam General: elderly breathing easily supine Heart: RRR Lungs: bilateral crackles Abdomen: soft Extremities: no sig edema Dialysis Access: PD cath LLQ   Dialysis Orders: [facility] [place]  Hangs 5L 1.5% and 5L 2.5%, does pause then 3 overnight cycles of and leaves a day bag of also. 5 cycles total   Assessment/Plan:  1. Peritonitis: likely catheter-related, cont IV vanc and levaquin,  cell count improved; cx neg to date; 1014 11/20 > 29 11/22 2. Hx Bacillus peritonitis Sept '14, treated, cath not removed 3. ESRD: CAPD 5x/day  Fibrin noted per RN - Heparin added to dialysate. K + 4.6. Na levels still low; review of weights show 54.4>57.5>58>61.8 since admission. Needs decreased volume Will change to 2.5 % alternating with 1.5% every 3rd exchange; repeat labs in am 4. HTN/Vol: Soft SBPs despite fluid bolus. IVF d/c'd. No BP meds. Daughter says EDW is 110# she is significantly above this. Sats ok but bilateral crackles - BP variable; weights in bed per nursing - have asked nursing to do standing weights with orthstatics 5. Anemia of CKD - Hgb 8.2 - stable s/p prbc's, Aranesp 100 q Fri , Tsat 55% with Ferritin 1851 this admit. Follow CBC; heme + will f/u with outpt GI Dr. Braulio Conte after d/c 6. MBD (metabolic bone disease) - Ca 7.8 (10 corrected) Phos 1.9. Will hold renvela 3 ac until eating better. 7. Nutrition. Alb 1.6 < 2.3 on admit. Poor po intake contributing.  Will liberalize diet to regular and ask RD to see; has boost supplements in room but not drinking - encouraged to drink two per day. Explained if nutrition remains poor, may need to change to HD. 8. Constipation: per GI, primary 9. Afib - on amiodarone 10. Deconditioning - need to Link Snuffer, PA-C [facility] [phone] 01/08/2013,9:31 AM  LOS: 4 days   Renal Attending: As above clinically improved with antibiotic treatment, but generalized weakness is problematic and will need strenghtening. Weigh up but clinically not impressive volume overload.  Cont PD. Akin Yi C    Additional Objective Labs: Basic Metabolic Panel:  Recent Labs Lab 01/06/13 1245 01/07/13 0628 01/08/13 0435  NA 128* 130* 129*  K 3.9 4.6 4.0  CL 92* 97 95*  CO2 23 23 22   GLUCOSE 143* 90 99  BUN 31* 29* 29*  CREATININE 4.54* 4.26* 4.75*  CALCIUM 7.9* 7.8* 8.1*  PHOS 2.2* 1.9* 2.8   Liver Function Tests:  Recent Labs Lab 01/04/13 1311  01/06/13 1245 01/07/13 0628 01/08/13 0435  AST 20  --   --   --   --   ALT 12  --   --   --   --   ALKPHOS 153*  --   --   --   --   BILITOT 0.4  --   --   --   --  PROT 6.5  --   --   --   --   ALBUMIN 2.3*  < > 1.5* 1.6* 1.6*  < > = values in this interval not displayed.  Recent Labs Lab 01/04/13 1311  LIPASE 17   CBC:  Recent Labs Lab 01/05/13 1800 01/06/13 0434 01/06/13 1245 01/07/13 0628 01/08/13 0435  WBC 21.0* 19.2* 15.6* 12.2* 10.9*  NEUTROABS  --   --  13.3* 9.4* 8.2*  HGB 8.1* 8.4* 8.5* 8.3* 8.2*  HCT 24.5* 25.3* 25.7* 25.7* 25.2*  MCV 86.0 86.1 86.8 88.0 88.7  PLT 304 328 347 359 346   Blood Culture    Component Value Date/Time   SDES BLOOD HAND LEFT 01/04/2013 1900   SPECREQUEST BOTTLES DRAWN AEROBIC ONLY 5CC 01/04/2013 1900   CULT  Value:        BLOOD CULTURE RECEIVED NO GROWTH TO DATE CULTURE WILL BE HELD FOR 5 DAYS BEFORE ISSUING A FINAL NEGATIVE REPORT Performed at [facility] 01/04/2013 1900   REPTSTATUS PENDING 01/04/2013 1900    Cardiac Enzymes:  Recent Labs Lab 01/04/13 0013 01/05/13 0434 01/05/13 1200  TROPONINI <0.30 <0.30 <0.30   CBG:  Recent Labs Lab 01/05/13 1200 01/05/13 2211 01/06/13 0831 01/07/13 0809 01/08/13 0756  GLUCAP 148* 145* 109* 105* 123*   Iron Studies:  Recent Labs  01/06/13 0434  IRON 59  TIBC 107*  FERRITIN 1851*   Medications: . dialysis solution 1.5% low-MG/low-CA     . amiodarone  100 mg Oral Daily  . darbepoetin (ARANESP) injection - NON-DIALYSIS  100 mcg Subcutaneous Q Fri-1800  . docusate sodium  100 mg Oral BID  . gabapentin  100 mg Oral BID  . hydrocortisone  25 mg Rectal BID  . lactose free nutrition  237 mL Oral BID BM  . levofloxacin (LEVAQUIN) IV  500 mg Intravenous Q48H  . levothyroxine  50 mcg Oral QAC breakfast  . lidocaine   Topical TID  . pantoprazole  40 mg Oral Daily  . rOPINIRole  0.25 mg Oral QHS  . simvastatin  10 mg Oral QHS  . sodium chloride  3 mL Intravenous Q12H  . sodium chloride  3 mL Intravenous Q12H

## 2013-01-08 NOTE — Progress Notes (Signed)
PHARMACY NOTE  Pharmacy Consult for :  Vancomycin Indication:  PD related peritonitis  Hospital Problems Principal Problem:   Peritonitis Active Problems:   End stage renal disease   Sepsis   Overflow diarrhea   Unspecified constipation   Nonspecific (abnormal) findings on radiological and other examination of gastrointestinal tract   Fecal occult blood test positive   Normocytic anemia   Diastolic dysfunction-grade 1    Hyponatremia   Weight: 61.8 kg  Vitals: BP 91/50  Pulse 69  Temp(Src) 98.3 F (36.8 C) (Oral)  Resp 20  Ht 5\' 3"  (1.6 m)  Wt 136 lb 3.9 oz (61.8 kg)  BMI 24.14 kg/m2  SpO2 99%  Labs:  Recent Labs  01/06/13 1245 01/07/13 0628 01/08/13 0435  WBC 15.6* 12.2* 10.9*  HGB 8.5* 8.3* 8.2*  PLT 347 359 346  CREATININE 4.54* 4.26* 4.75*   Estimated Creatinine Clearance: 7.8 ml/min (by C-G formula based on Cr of 4.75).  Random Vancomycin level = 17.6 mcg/ml   Microbiology: Recent Results (from the past 720 hour(s))  BODY FLUID CULTURE     Status: None   Collection Time    01/04/13  4:12 PM      Result Value Range Status   Specimen Description PERITONEAL DIALYSATE   Final   Special Requests NONE   Final   Gram Stain     Final   Value: NO WBC SEEN     NO ORGANISMS SEEN     Performed at [facility]   Culture     Final   Value: NO GROWTH 3 DAYS     Performed at [facility]   Report Status 01/07/2013 FINAL   Final  CULTURE, BLOOD (ROUTINE X 2)     Status: None   Collection Time    01/04/13  6:05 PM      Result Value Range Status   Specimen Description BLOOD HAND RIGHT   Final   Special Requests BOTTLES DRAWN AEROBIC ONLY 3CC   Final   Culture  Setup Time     Final   Value: 01/04/2013 22:14     Performed at [facility]   Culture     Final   Value:        BLOOD CULTURE RECEIVED NO GROWTH TO DATE CULTURE WILL BE HELD FOR 5 DAYS BEFORE ISSUING A FINAL NEGATIVE REPORT     Performed at [facility]   Report Status PENDING   Incomplete  CULTURE, BLOOD (ROUTINE X 2)     Status: None   Collection Time    01/04/13  7:00 PM      Result Value Range Status   Specimen Description BLOOD HAND LEFT   Final   Special Requests BOTTLES DRAWN AEROBIC ONLY 5CC   Final   Culture  Setup Time     Final   Value: 01/05/2013 03:58     Performed at [facility]   Culture     Final   Value:        BLOOD CULTURE RECEIVED NO GROWTH TO DATE CULTURE WILL BE HELD FOR 5 DAYS BEFORE ISSUING A FINAL NEGATIVE REPORT     Performed at [facility]   Report Status PENDING   Incomplete  MRSA PCR SCREENING     Status: None   Collection Time    01/04/13 10:12 PM      Result Value Range Status   MRSA by PCR NEGATIVE  NEGATIVE Final  Comment:            The GeneXpert MRSA Assay (FDA     approved for NASAL specimens     only), is one component of a     comprehensive MRSA colonization     surveillance program. It is not     intended to diagnose MRSA     infection nor to guide or     monitor treatment for     MRSA infections.  CLOSTRIDIUM DIFFICILE BY PCR     Status: None   Collection Time    01/05/13  2:52 AM      Result Value Range Status   C difficile by pcr NEGATIVE  NEGATIVE Final    Anti-infectives Anti-infectives   Start     Dose/Rate Route Frequency Ordered Stop   01/08/13 1330  vancomycin (VANCOCIN) 750 mg in sodium chloride 0.9 % 150 mL IVPB     750 mg 150 mL/hr over 60 Minutes Intravenous  Once 01/08/13 1318     01/06/13 2000  levofloxacin (LEVAQUIN) IVPB 500 mg     500 mg 100 mL/hr over 60 Minutes Intravenous Every 48 hours 01/04/13 1710     01/04/13 1715  levofloxacin (LEVAQUIN) IVPB 750 mg     750 mg 100 mL/hr over 90 Minutes Intravenous  Once 01/04/13 1706 01/04/13 2237   01/04/13 1715  vancomycin (VANCOCIN) 1,500 mg in sodium chloride 0.9 % 500 mL IVPB     1,500 mg 250 mL/hr over 120 Minutes Intravenous  Once 01/04/13 1706 01/04/13 2021       Assessment:  Day # 8 of 14 for Vancomycin and Levaquin for PD related Peritonitis.  Random Vancomycin level = 17.6 mcg/ml.  Patient Afebrile.  WBC 10.8.  Goal of Therapy:   Vancomycin random level 15-20 mcg/ml Antibiotics selected for infection/cultures and adjusted for PD scheduling.   Plan:   Re-dose Vancomycin 750 mg IV now.  Continue Levaquin 500 mg IV q 48 hours.  Laurena Bering, Pharm.D.  01/08/2013 1:19 PM

## 2013-01-08 NOTE — Progress Notes (Signed)
   CARE MANAGEMENT NOTE 01/08/2013  Patient:  Valerie Yoder, Valerie Yoder   Account Number:  [id number]  Date Initiated:  01/05/2013  Documentation initiated by:  Donn Pierini  Subjective/Objective Assessment:   Pt admitted with Hypotension is setting of ESRD with Peritonitis.     Action/Plan:   PTA pt lived at home with children- NCM to follow for d/c needs   Anticipated DC Date:  01/08/2013   Anticipated DC Plan:  HOME W HOME HEALTH SERVICES      DC Planning Services  CM consult      [facility] Choice  HOME HEALTH   Choice offered to / List presented to:  C-1 Patient        HH arranged  HH-2 PT      Status of service:  In process, will continue to follow Medicare Important Message given?   (If response is "NO", the following Medicare IM given date fields will be blank) Date Medicare IM given:   Date Additional Medicare IM given:    Discharge Disposition:    Per UR Regulation:  Reviewed for med. necessity/level of care/duration of stay  If discussed at Long Length of Stay Meetings, dates discussed:    Comments:  01/08/2013   [address] RN, [place]  [phone] CM referral: HHPT  met with patient to discuss home health services. She had [facility] health and requests to use them.  [facility] health/[facility] called with referral.  FAXED referral to [phone]

## 2013-01-08 NOTE — Progress Notes (Signed)
INITIAL NUTRITION ASSESSMENT  DOCUMENTATION CODES Per approved criteria  -Not Applicable   INTERVENTION: Try SB Ensure Complete Continue Boost (Patient bringing from home-strawberry) Encouraged intake Provide preferences RD to follow  NUTRITION DIAGNOSIS: Inadequate oral intake  related to decreased appetite as evidenced by patient report.   Goal: Patient to meet >90% estimated needs with meals and supplements  Monitor:  Intake, labs, weight trend  Reason for Assessment: poor po consult  79 y.o. female  Admitting Dx: Peritonitis  ASSESSMENT: Patient admitted with peritonitis.  ESRD with hx of CAPD at home.  Follows a low sodium diet but not strictly.  Has had a poor appetite for the last 2-3 weeks.  Current intake of meals is poor.  Drinks Boost from home (prefers Strawberry).  Weight is increased currently secondary to fluid.  Diet changed to Regular today secondary patient's dislike of modified diet.  Nutrition Focused Physical Exam:  Subcutaneous Fat:  Orbital Region: wnl Upper Arm Region: mild Thoracic and Lumbar Region: n/a  Muscle:  Temple Region: mild Clavicle Bone Region: whl Clavicle and Acromion Bone Region: Child psychotherapist Bone Region: whl Dorsal Hand: wnl Patellar Region: wnl Anterior Thigh Region: wnl Posterior Calf Region: mild  Edema: possitive   Height: Ht Readings from Last 1 Encounters:  01/06/13 5\' 3"  (1.6 m)    Weight: Wt Readings from Last 1 Encounters:  01/07/13 136 lb 3.9 oz (61.8 kg)    Ideal Body Weight: 115 lbs  % Ideal Body Weight: 120  Wt Readings from Last 10 Encounters:  01/07/13 136 lb 3.9 oz (61.8 kg)  10/12/12 113 lb (51.256 kg)  07/15/11 111 lb 3.2 oz (50.44 kg)    Usual Body Weight: 113 lbs  % Usual Body Weight: 120  BMI:  Body mass index is 24.14 kg/(m^2).  Estimated Nutritional Needs: Kcal: 1750-1850 Protein: 65-75 gm Fluid: 1.5L  Skin: wnl  Diet Order: General  EDUCATION NEEDS: -No education  needs identified at this time   Intake/Output Summary (Last 24 hours) at 01/08/13 1611 Last data filed at 01/08/13 0900  Gross per 24 hour  Intake    480 ml  Output      0 ml  Net    480 ml       Labs:   Recent Labs Lab 01/05/13 0434 01/06/13 1245 01/07/13 0628 01/08/13 0435  NA 127* 128* 130* 129*  K 3.1* 3.9 4.6 4.0  CL 89* 92* 97 95*  CO2 22 23 23 22   BUN 41* 31* 29* 29*  CREATININE 5.52* 4.54* 4.26* 4.75*  CALCIUM 8.1* 7.9* 7.8* 8.1*  MG 1.5  --   --   --   PHOS 3.8 2.2* 1.9* 2.8  GLUCOSE 97 143* 90 99    CBG (last 3)   Recent Labs  01/06/13 0831 01/07/13 0809 01/08/13 0756  GLUCAP 109* 105* 123*    Scheduled Meds: . amiodarone  100 mg Oral Daily  . darbepoetin (ARANESP) injection - NON-DIALYSIS  100 mcg Subcutaneous Q Fri-1800  . docusate sodium  100 mg Oral BID  . gabapentin  100 mg Oral BID  . hydrocortisone  25 mg Rectal BID  . lactose free nutrition  237 mL Oral BID BM  . levofloxacin (LEVAQUIN) IV  500 mg Intravenous Q48H  . levothyroxine  50 mcg Oral QAC breakfast  . lidocaine   Topical TID  . pantoprazole  40 mg Oral Daily  . rOPINIRole  0.25 mg Oral QHS  . simvastatin  10 mg Oral QHS  .  sodium chloride  3 mL Intravenous Q12H  . sodium chloride  3 mL Intravenous Q12H    Continuous Infusions: . dialysis solution 1.5% low-MG/low-CA      Past Medical History  Diagnosis Date  . Hypertension   . Diabetes mellitus   . Renal failure   . Mitral valve prolapse   . Hyperlipidemia   . CHF (congestive heart failure)   . Atrial fibrillation   . Arthritis   . Cancer 07/02/11    Left face    Past Surgical History  Procedure Laterality Date  . Nephrectomy      right kidney  . Abdominal hysterectomy    . Appendectomy    . Tonsillectomy    . Av fistula placement  2010    left brachiocephalic AVF  . Peritoneal catheter insertion  2011    Oran Rein, RD, LDN Clinical Inpatient Dietitian Pager:  [phone] Weekend and after hours  pager:  [phone]

## 2013-01-08 NOTE — Progress Notes (Signed)
OT Cancellation Note and Discharge  Patient Details Name: Valerie Yoder MRN: [id number] DOB: 04-Oct-1930   Cancelled Treatment:    Reason Eval/Treat Not Completed: OT screened, no needs identified, will sign off. In to speak to pt and her daughter-in-law (24 hour caregiver). They are aware that pt will need more A initially and dtr-in-law says she can provide this (and has been providing more A to her in the last 4 weeks). They have a 3n1 and pt only sponge baths so no tub DME needed. They are only interested in HHPT services. Acute OT will sign off.  Evette Georges [phone] 01/08/2013, 2:08 PM

## 2013-01-08 NOTE — Progress Notes (Signed)
Subjective: Valerie Yoder is a 79 year old female with a PMHx of ESRD on peritoneal dialysis, admitted for peritonitis.  Patient seen at bedside this AM. Has very little complaints today except for not sleeping well. Denies any abdominal pain but still complaining of some rectal pain. Says she has been having small bowel movements. No fever, chills, nausea, vomiting.   Objective: Vital signs in last 24 hours: Filed Vitals:   01/07/13 1455 01/07/13 1841 01/07/13 2029 01/08/13 0607  BP: 113/54 118/64 100/66 96/57  Pulse: 64 78 76 69  Temp:  97.2 F (36.2 C) 98.6 F (37 C) 98.5 F (36.9 C)  TempSrc:  Oral Oral Oral  Resp:  18 18 17   Height:      Weight:   136 lb 3.9 oz (61.8 kg)   SpO2:  96% 97% 98%   Weight change: 8 lb 6 oz (3.8 kg)  Intake/Output Summary (Last 24 hours) at 01/08/13 1610 Last data filed at 01/07/13 2218  Gross per 24 hour  Intake    240 ml  Output      0 ml  Net    240 ml   Physical Exam: General: Alert, cooperative, and in no apparent distress HEENT: Vision grossly intact, oropharynx clear and non-erythematous  Neck: Full range of motion without pain, supple, no lymphadenopathy or carotid bruits Lungs: Clear to ascultation bilaterally, normal work of respiration, no wheezes, rales, ronchi Heart: Regular rate and rhythm, no murmurs, gallops, or rubs Abdomen: Soft, non-tender, mildly distended, normal bowel sounds. Peritoneal dialysis site clean, dry, intact. Extremities: No cyanosis, clubbing, or edema Neurologic: Alert & oriented X3, cranial nerves II-XII intact, strength grossly intact, sensation intact to light touch  Lab Results: Basic Metabolic Panel:  Recent Labs Lab 01/05/13 0434  01/07/13 0628 01/08/13 0435  NA 127*  < > 130* 129*  K 3.1*  < > 4.6 4.0  CL 89*  < > 97 95*  CO2 22  < > 23 22  GLUCOSE 97  < > 90 99  BUN 41*  < > 29* 29*  CREATININE 5.52*  < > 4.26* 4.75*  CALCIUM 8.1*  < > 7.8* 8.1*  MG 1.5  --   --   --   PHOS 3.8  < >  1.9* 2.8  < > = values in this interval not displayed. Liver Function Tests:  Recent Labs Lab 01/04/13 1311  01/07/13 0628 01/08/13 0435  AST 20  --   --   --   ALT 12  --   --   --   ALKPHOS 153*  --   --   --   BILITOT 0.4  --   --   --   PROT 6.5  --   --   --   ALBUMIN 2.3*  < > 1.6* 1.6*  < > = values in this interval not displayed.  Recent Labs Lab 01/04/13 1311  LIPASE 17   CBC:  Recent Labs Lab 01/07/13 0628 01/08/13 0435  WBC 12.2* 10.9*  NEUTROABS 9.4* 8.2*  HGB 8.3* 8.2*  HCT 25.7* 25.2*  MCV 88.0 88.7  PLT 359 346   Cardiac Enzymes:  Recent Labs Lab 01/04/13 0013 01/05/13 0434 01/05/13 1200  TROPONINI <0.30 <0.30 <0.30   CBG:  Recent Labs Lab 01/05/13 0749 01/05/13 1200 01/05/13 2211 01/06/13 0831 01/07/13 0809 01/08/13 0756  GLUCAP 135* 148* 145* 109* 105* 123*   Coagulation:  Recent Labs Lab 01/04/13 1311  LABPROT 14.6  INR  1.16   Micro Results: Recent Results (from the past 240 hour(s))  BODY FLUID CULTURE     Status: None   Collection Time    01/04/13  4:12 PM      Result Value Range Status   Specimen Description PERITONEAL DIALYSATE   Final   Special Requests NONE   Final   Gram Stain     Final   Value: NO WBC SEEN     NO ORGANISMS SEEN     Performed at [facility]   Culture     Final   Value: NO GROWTH 3 DAYS     Performed at [facility]   Report Status 01/07/2013 FINAL   Final  CULTURE, BLOOD (ROUTINE X 2)     Status: None   Collection Time    01/04/13  6:05 PM      Result Value Range Status   Specimen Description BLOOD HAND RIGHT   Final   Special Requests BOTTLES DRAWN AEROBIC ONLY 3CC   Final   Culture  Setup Time     Final   Value: 01/04/2013 22:14     Performed at [facility]   Culture     Final   Value:        BLOOD CULTURE RECEIVED NO GROWTH TO DATE CULTURE WILL BE HELD FOR 5 DAYS BEFORE ISSUING A FINAL NEGATIVE REPORT     Performed at [facility]   Report Status  PENDING   Incomplete  CULTURE, BLOOD (ROUTINE X 2)     Status: None   Collection Time    01/04/13  7:00 PM      Result Value Range Status   Specimen Description BLOOD HAND LEFT   Final   Special Requests BOTTLES DRAWN AEROBIC ONLY 5CC   Final   Culture  Setup Time     Final   Value: 01/05/2013 03:58     Performed at [facility]   Culture     Final   Value:        BLOOD CULTURE RECEIVED NO GROWTH TO DATE CULTURE WILL BE HELD FOR 5 DAYS BEFORE ISSUING A FINAL NEGATIVE REPORT     Performed at [facility]   Report Status PENDING   Incomplete  MRSA PCR SCREENING     Status: None   Collection Time    01/04/13 10:12 PM      Result Value Range Status   MRSA by PCR NEGATIVE  NEGATIVE Final   Comment:            The GeneXpert MRSA Assay (FDA     approved for NASAL specimens     only), is one component of a     comprehensive MRSA colonization     surveillance program. It is not     intended to diagnose MRSA     infection nor to guide or     monitor treatment for     MRSA infections.  CLOSTRIDIUM DIFFICILE BY PCR     Status: None   Collection Time    01/05/13  2:52 AM      Result Value Range Status   C difficile by pcr NEGATIVE  NEGATIVE Final   Studies/Results: No results found. Medications: I have reviewed the patient's current medications. Scheduled Meds: . amiodarone  100 mg Oral Daily  . darbepoetin (ARANESP) injection - NON-DIALYSIS  100 mcg Subcutaneous Q Fri-1800  . docusate sodium  100 mg Oral BID  .  gabapentin  100 mg Oral BID  . hydrocortisone  25 mg Rectal BID  . lactose free nutrition  237 mL Oral BID BM  . levofloxacin (LEVAQUIN) IV  500 mg Intravenous Q48H  . levothyroxine  50 mcg Oral QAC breakfast  . lidocaine   Topical TID  . pantoprazole  40 mg Oral Daily  . rOPINIRole  0.25 mg Oral QHS  . simvastatin  10 mg Oral QHS  . sodium chloride  3 mL Intravenous Q12H  . sodium chloride  3 mL Intravenous Q12H   Continuous Infusions: . dialysis  solution 1.5% low-MG/low-CA     PRN Meds:.sodium chloride, acetaminophen, acetaminophen, heparin, ondansetron (ZOFRAN) IV, ondansetron, polyethylene glycol, sodium chloride, sodium chloride  Assessment/Plan: Valerie Yoder is a 79 year old female with a PMHx of ESRD on peritoneal dialysis, admitted for possible PD related peritonitis.  PD related Peritonitis- Patient with history of ESRD on peritoneal dialysis. Found to have a leukocytosis of 35k, lactic acid of 2.6 on admission. Baseline hypotension per patient with SBP 70-90s. Nephrology was consulted in the ED and obtained a peritoneal fluid cell count and culture, cell count revealed a WBC 1014, w/ 64% PMNs. WBC's continue to trend down as follows:  Recent Labs Lab 01/06/13 1245 01/07/13 0628 01/08/13 0435  HGB 8.5* 8.3* 8.2*  HCT 25.7* 25.7* 25.2*  WBC 15.6* 12.2* 10.9*  PLT 347 359 346   - Continue Vancomycin and Levaquin for now. Will transition to oral levaquin in AM + Doxyxyclin po.  - Follow Peritoneal fluid culture, Blood cultures; both still -ve  Anemia w/ Hypotension- Patient w/ Hb of 9.0 on admission. Then found to be 6.8. Patient also w/ hypotension, but patient claims she is always 80/40-50's. No obvious signs of bleeding at this time. Still w/ LIJ central line. Recent Hb trend as above. 8.2 today, BP stable.  - Will continue to monitor.  End stage renal disease- Patient has been on peritoneal dialysis x 4 years.  - Nephrology consulted and following. - Continue peritoneal dialysis per renal.  - Electrolytes stable.   Proctitis- Patient describes attempting to disimpact herself 3 days prior to admission, since then she has had perirectal pain and seen some blood in stools. FOBT +. She was noted to have mild wall thickening of the distal rectum. She denies any history of UC or radiation. Claims she has been having BM's, but small amounts, soft in nature. Still with rectal pain.  - GI following, recommend flex sig or  colonoscopy as an outpatient. - Continue Colace + Miralax - No sign of anal fissure on exam.  - Patient on broad spectrum antibiotics for peritonitis, no sign of abscess formation on CT.  - C. Dif -ve - Continue to trend CBC. See above  Hx of Afib--Was previously on coumadin, now on plavix. Also on amiodarone. In sinus rhythm on admission.  - Holding plavix in setting of rectal bleeding  - Continue amiodarone   Osteoporosis- Patient has ESRD. Stable T 12 compression fracture, mild T9 compression fracture. Alk phos elevated without elevation of AST/ALT.  - Management as outpatient.   Malnutrition- Patient admits decreased PO intake x 2 days prior to admission w/ Albumin 2.3. Albumin 1.6 today.  - Started Boost plus b/w meals.  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  Anticipated discharge in approximately 1-2 day(s).   The patient does have a current  PCP (Valerie Moon, NP) and does need an [facility] hospital follow-up appointment after discharge.  The patient does not have transportation limitations that hinder transportation to clinic appointments.  .Services Needed at time of discharge: Y = Yes, Blank = No PT:   OT:   RN:   Equipment:   Other:     LOS: 4 days   Courtney Paris, MD 01/08/2013, 8:24 AM Pager: [phone]

## 2013-01-08 NOTE — Progress Notes (Signed)
  Date: 01/08/2013  Patient name: Valerie Yoder  Medical record number: [id number]  Date of birth: [birth year]   This patient has been seen and the plan of care was discussed with the house staff. Please see their note for complete details. I concur with their findings with the following additions/corrections: Feels well. No abdominal pain. Peritoneal fluid culture negative to date. Appreciate ID input, will need to continue levaquin and Vanc per PD or doxy, will need final recs. Ultimately will need nephro discussion on need to stop PD and start HD given recurrent peritonitis.  Jonah Blue, DO, FACP Faculty [facility] Internal Medicine Residency Program 01/08/2013, 12:44 PM

## 2013-01-08 NOTE — Evaluation (Signed)
Physical Therapy Evaluation Patient Details Name: Valerie Yoder MRN: [id number] DOB: Dec 11, 1930 Today's Date: 01/08/2013 Time: 0841-0900 PT Time Calculation (min): 19 min  PT Assessment / Plan / Recommendation History of Present Illness   Pt is a 79 y.o. Female adm secondary to having multiple loose BMs, blood in stool and abdominal pain. PMH includes ESRD on peritoneal dialysis, HTN, DMT2, PAF, CHF, HTD.   Clinical Impression  Pt adm due to above. Presents with decreased independence with functional mobility secondary to deficits listed below (see PT problem list). Pt to benefit from skilled PT in acute setting to address deficits listed below and return to PLOF. Pt would benefit from SNF for post acute rehab but is refusing at this time. Reports she will have 24/7 (A) at home and is agreeable to HHPT.     PT Assessment  Patient needs continued PT services    Follow Up Recommendations  Home health PT;Supervision/Assistance - 24 hour;Other (comment) (pt refusing SNF)    Does the patient have the potential to tolerate intense rehabilitation      Barriers to Discharge        Equipment Recommendations  None recommended by PT    Recommendations for Other Services OT consult   Frequency Min 3X/week    Precautions / Restrictions Precautions Precautions: Fall Precaution Comments: fell 2 weeks ago Restrictions Weight Bearing Restrictions: No   Pertinent Vitals/Pain See VSS       Mobility  Bed Mobility Bed Mobility: Supine to Sit;Sitting - Scoot to Edge of Bed;Sit to Supine Supine to Sit: 4: Min assist;HOB flat;With rails Sitting - Scoot to Edge of Bed: 5: Supervision Sit to Supine: 4: Min assist;HOB flat Details for Bed Mobility Assistance: pt required (A) to bring shoulders to upright sitting position and to advance LEs back onto bed to return to supine secondary to generalized weakness; cues for hand placement and sequencing  Transfers Transfers: Sit to Stand;Stand to  Sit Sit to Stand: 3: Mod assist;From bed;With upper extremity assist Stand to Sit: 3: Mod assist;To chair/3-in-1;To bed;With upper extremity assist;With armrests Details for Transfer Assistance: (A) to achieve upright standing position and maintain balance; pt unsteady and demo decreased strength in LEs; pt unable to eccentrically control descent into sitting position; cues for hand placement and sequencing  Ambulation/Gait Ambulation/Gait Assistance: 3: Mod assist Ambulation Distance (Feet): 6 Feet (x2; bed <> BSC ) Assistive device: 1 person hand held assist Ambulation/Gait Assistance Details: pt unsteady with gt; reaching for external objects to brace herself; difficulty weightshifting secondary to pain; pt braced with handheld (A) and with (A) around waist for safety  Gait Pattern: Step-to pattern;Decreased stance time - right;Wide base of support Gait velocity: very decreased Stairs: No Wheelchair Mobility Wheelchair Mobility: No         PT Diagnosis: Difficulty walking;Generalized weakness  PT Problem List: Decreased strength;Decreased balance;Decreased mobility;Decreased knowledge of use of DME;Pain PT Treatment Interventions: DME instruction;Gait training;Functional mobility training;Therapeutic activities;Therapeutic exercise;Balance training;Neuromuscular re-education;Patient/family education     PT Goals(Current goals can be found in the care plan section) Acute Rehab PT Goals Patient Stated Goal: to go home with my family PT Goal Formulation: With patient Time For Goal Achievement: 01/22/13 Potential to Achieve Goals: Good  Visit Information  Last PT Received On: 01/08/13 Assistance Needed: +1       Prior Functioning  Home Living Family/patient expects to be discharged to:: Private residence Living Arrangements: Children Available Help at Discharge: Family;Available 24 hours/day Type of Home: House Home Access:  Ramped entrance Home Layout: One level Home  Equipment: Walker - 2 wheels;Bedside commode;Cane - single point Additional Comments: pt reports she has had Rt knee pain and has been limited in mobility recently secondary to pain  Prior Function Level of Independence: Independent Comments: pt was independent till 3 weeks ago when she began having pain in her Rt knee; pt takes sponge baths Communication Communication: No difficulties    Cognition  Cognition Arousal/Alertness: Awake/alert Behavior During Therapy: WFL for tasks assessed/performed Overall Cognitive Status: Within Functional Limits for tasks assessed    Extremity/Trunk Assessment Upper Extremity Assessment Upper Extremity Assessment: Defer to OT evaluation Lower Extremity Assessment Lower Extremity Assessment: Generalized weakness Cervical / Trunk Assessment Cervical / Trunk Assessment: Normal   Balance Balance Balance Assessed: Yes Static Standing Balance Static Standing - Balance Support: Left upper extremity supported;During functional activity Static Standing - Level of Assistance: 4: Min assist  End of Session PT - End of Session Equipment Utilized During Treatment: Gait belt Activity Tolerance: Patient tolerated treatment well Patient left: in bed;with call bell/phone within reach;with bed alarm set Nurse Communication: Mobility status  GP     Donell Sievert, [place] [phone] 01/08/2013, 9:17 AM

## 2013-01-08 NOTE — H&P (Signed)
See my note.  Jonah Blue, DO, FACP Faculty [facility] Internal Medicine Residency Program 01/08/2013, 4:28 PM

## 2013-01-09 LAB — CBC WITH DIFFERENTIAL/PLATELET
Basophils Absolute: 0 10*3/uL (ref 0.0–0.1)
Eosinophils Absolute: 0.3 10*3/uL (ref 0.0–0.7)
Eosinophils Relative: 3 % (ref 0–5)
HCT: 24.9 % — ABNORMAL LOW (ref 36.0–46.0)
MCH: 29 pg (ref 26.0–34.0)
MCHC: 32.5 g/dL (ref 30.0–36.0)
MCV: 89.2 fL (ref 78.0–100.0)
Monocytes Absolute: 1 10*3/uL (ref 0.1–1.0)
Platelets: 359 10*3/uL (ref 150–400)
RBC: 2.79 MIL/uL — ABNORMAL LOW (ref 3.87–5.11)
RDW: 16.7 % — ABNORMAL HIGH (ref 11.5–15.5)
WBC: 9.8 10*3/uL (ref 4.0–10.5)

## 2013-01-09 LAB — MAGNESIUM: Magnesium: 1.4 mg/dL — ABNORMAL LOW (ref 1.5–2.5)

## 2013-01-09 LAB — TYPE AND SCREEN

## 2013-01-09 LAB — RENAL FUNCTION PANEL
Albumin: 1.5 g/dL — ABNORMAL LOW (ref 3.5–5.2)
BUN: 27 mg/dL — ABNORMAL HIGH (ref 6–23)
CO2: 23 mEq/L (ref 19–32)
Calcium: 7.8 mg/dL — ABNORMAL LOW (ref 8.4–10.5)
Chloride: 92 mEq/L — ABNORMAL LOW (ref 96–112)
Creatinine, Ser: 4.57 mg/dL — ABNORMAL HIGH (ref 0.50–1.10)
GFR calc Af Amer: 10 mL/min — ABNORMAL LOW (ref >90)
GFR calc non Af Amer: 8 mL/min — ABNORMAL LOW (ref >90)
Glucose, Bld: 107 mg/dL — ABNORMAL HIGH (ref 70–99)
Phosphorus: 2.6 mg/dL (ref 2.3–4.6)
Potassium: 3.6 mEq/L (ref 3.5–5.1)
Sodium: 128 mEq/L — ABNORMAL LOW (ref 135–145)

## 2013-01-09 MED ORDER — DOXYCYCLINE HYCLATE 100 MG PO CAPS: 100.0000 mg | ORAL_CAPSULE | Freq: Every day | ORAL | Status: DC

## 2013-01-09 MED ORDER — POTASSIUM CHLORIDE CRYS ER 20 MEQ PO TBCR
20.0000 meq | EXTENDED_RELEASE_TABLET | Freq: Two times a day (BID) | ORAL | Status: DC
  Administered 2013-01-09: 20 meq via ORAL

## 2013-01-09 MED ORDER — LEVOFLOXACIN 250 MG PO TABS: 250.0000 mg | ORAL_TABLET | Freq: Every day | ORAL | Status: DC

## 2013-01-09 MED ORDER — RENA-VITE PO TABS
1.0000 | ORAL_TABLET | Freq: Every day | ORAL | Status: DC
  Filled 2013-01-09: qty 1

## 2013-01-09 NOTE — Progress Notes (Signed)
  Date: 01/09/2013  Patient name: Valerie Yoder  Medical record number: [id number]  Date of birth: 05/04/1930   This patient has been seen and the plan of care was discussed with the house staff. Please see their note for complete details. I concur with their findings with the following additions/corrections: States she feels stronger today.  Having BM's somewhat solid, still with mild diarrhea. Appreciate ID input. Will finish course of doxy and levaquin as outpatient. Peritoneal cultures negative to date.  If ok with Nephro, would D/C home with follow up.  She is hemodynamically stable.  Jonah Blue, DO, FACP Faculty [facility] Internal Medicine Residency Program 01/09/2013, 12:25 PM

## 2013-01-09 NOTE — Progress Notes (Signed)
[facility] Progress Note  Subjective:   Feels a little stronger but no standing weights yet or orthostatics. Daughter said she saw blood in her stool today. Pt still with "diarrhea"  Objective Filed Vitals:   01/08/13 0607 01/08/13 0950 01/08/13 1824 01/08/13 2018  BP: 96/57 91/50 109/65 103/66  Pulse: 69 69 74 73  Temp: 98.5 F (36.9 C) 98.3 F (36.8 C) 98.5 F (36.9 C) 98.5 F (36.9 C)  TempSrc: Oral Oral Oral Oral  Resp: 17 20 18 18   Height:    5\' 3"  (1.6 m)  Weight:    59.512 kg (131 lb 3.2 oz)  SpO2: 98% 99% 95% 96%   Physical Exam General: sitting in recliner, looks better Heart: RRR Lungs: bilateral crackles no wheezes or rhonchi Abdomen: soft NT Extremities: no LE edema; heel skin cracked Dialysis Access: PD cath LLQ   Dialysis Orders: [facility]CPD Antoine  Hangs 5L 1.5% and 5L 2.5%, does pause then 3 overnight cycles of and leaves a day bag of also. 5 cycles total   Assessment/Plan:  1. Peritonitis: likely catheter-related, cont IV vanc and levaquin, cell count improved; cx neg to date; 1014 11/20 > 29 11/22. Repeat cell count just to be sure resolving 2. Hx Bacillus peritonitis Sept '14, treated, cath not removed 3. ESRD: CAPD 5x/day Fibrin noted per RN - Heparin added to dialysate. K + 3.6 - give 40 KCl . Na levels still low; review of weights show 54.4>57.5>58>61.8 >59.5 - since admission. Needs decreased volume Changed to 2.5 % alternating with 1.5% every 3rd exchange 11/24 - continue with this - weight decrease not reflected in I/O or labs; repeat labs in am 4. HypotensionTN/Vol: Soft SBPs despite fluid bolus. IVF d/c'd. No BP meds. Daughter says EDW is 110# she is significantly above this. Sats ok but bilateral crackles- tolerating well but Na still low - BP variable; weights in bed per nursing - have asked nursing AGAIN to do standing weights with orthstatics and call me with weight after next drain: ? Give midodrine 5. Anemia of CKD  - Hgb 8.1 - stable s/p prbc's, Aranesp 100 q Fri , Tsat 55% with Ferritin 1851 this admit. Follow CBC; heme + will f/u with outpt GI Dr. Braulio Conte after d/c 6. MBD (metabolic bone disease) - P better. Will hold renvela 3 ac until eating better. 7. Nutrition. Alb 1.5 < 2.3 on admit. Poor po intake contributing. Will liberalize diet to regular; seen by RD 11/24 - encouraged to drink two per day. Explained if nutrition remains poor, may need to change to HD. 8. GI issues:  Diarrhea most aggravating to her; still with blood in stool,  CT suggestive of proctitis; c diff neg; annusol suppositories 9. Afib - on amiodarone 10. Deconditioning - need to mobilze 11. Hypothyroidism - check TSH with am labs.  Sheffield Slider, PA-C [facility] Beeper [phone] 01/09/2013,9:35 AM  LOS: 5 days   Renal Attending: I spoke with Dr. Allena Katz.  Pt will be treated with doxycycline for about 6 more days to simplify regimen of antibiotics.  She remains very weak and hopefully is not harboring an occult organism. Raima Geathers C   Additional Objective Labs: Basic Metabolic Panel:  Recent Labs Lab 01/07/13 0628 01/08/13 0435 01/09/13 0507  NA 130* 129* 128*  K 4.6 4.0 3.6  CL 97 95* 92*  CO2 23 22 23   GLUCOSE 90 99 107*  BUN 29* 29* 27*  CREATININE 4.26* 4.75* 4.57*  CALCIUM  7.8* 8.1* 7.8*  PHOS 1.9* 2.8 2.6   Liver Function Tests:  Recent Labs Lab 01/04/13 1311  01/07/13 0628 01/08/13 0435 01/09/13 0507  AST 20  --   --   --   --   ALT 12  --   --   --   --   ALKPHOS 153*  --   --   --   --   BILITOT 0.4  --   --   --   --   PROT 6.5  --   --   --   --   ALBUMIN 2.3*  < > 1.6* 1.6* 1.5*  < > = values in this interval not displayed.  Recent Labs Lab 01/04/13 1311  LIPASE 17   CBC:  Recent Labs Lab 01/06/13 0434 01/06/13 1245 01/07/13 0628 01/08/13 0435 01/09/13 0507  WBC 19.2* 15.6* 12.2* 10.9* 9.8  NEUTROABS  --  13.3* 9.4* 8.2* 7.3  HGB 8.4* 8.5* 8.3* 8.2*  8.1*  HCT 25.3* 25.7* 25.7* 25.2* 24.9*  MCV 86.1 86.8 88.0 88.7 89.2  PLT 328 347 359 346 359   Blood Culture    Component Value Date/Time   SDES BLOOD HAND LEFT 01/04/2013 1900   SPECREQUEST BOTTLES DRAWN AEROBIC ONLY 5CC 01/04/2013 1900   CULT  Value:        BLOOD CULTURE RECEIVED NO GROWTH TO DATE CULTURE WILL BE HELD FOR 5 DAYS BEFORE ISSUING A FINAL NEGATIVE REPORT Performed at [facility] 01/04/2013 1900   REPTSTATUS PENDING 01/04/2013 1900    Cardiac Enzymes:  Recent Labs Lab 01/04/13 0013 01/05/13 0434 01/05/13 1200  TROPONINI <0.30 <0.30 <0.30   CBG:  Recent Labs Lab 01/05/13 2211 01/06/13 0831 01/07/13 0809 01/08/13 0756 01/09/13 0735  GLUCAP 145* 109* 105* 123* 124*   IMedications: . dialysis solution 1.5% low-MG/low-CA     . amiodarone  100 mg Oral Daily  . darbepoetin (ARANESP) injection - NON-DIALYSIS  100 mcg Subcutaneous Q Fri-1800  . docusate sodium  100 mg Oral BID  . gabapentin  100 mg Oral BID  . hydrocortisone  25 mg Rectal BID  . lactose free nutrition  237 mL Oral BID BM  . levofloxacin (LEVAQUIN) IV  500 mg Intravenous Q48H  . levothyroxine  50 mcg Oral QAC breakfast  . lidocaine   Topical TID  . pantoprazole  40 mg Oral Daily  . rOPINIRole  0.25 mg Oral QHS  . simvastatin  10 mg Oral QHS  . sodium chloride  3 mL Intravenous Q12H  . sodium chloride  3 mL Intravenous Q12H

## 2013-01-09 NOTE — Discharge Summary (Signed)
Name: Valerie Yoder MRN: [id number] DOB: 04/24/1930 79 y.o. PCP: Valerie Alken, NP  Date of Admission: 01/04/2013 12:33 PM Date of Discharge: 01/09/13 Attending Physician: Kem Kays  Discharge Diagnosis: 1. Peritoneal Dialysis Related Peritonitis 2. Anemia w/ HoTN 3. ESRD 4. Proctitis 5. H/o A-fib 6. Osteoporosis 7. Protein Calorie Malnutrition 8. Presumed Angiomyolipoma  Discharge Medications:   Medication List    STOP taking these medications       azithromycin 250 MG tablet  Commonly known as:  ZITHROMAX      TAKE these medications       acetaminophen 500 MG tablet  Commonly known as:  TYLENOL  Take 1,000 mg by mouth daily as needed for fever.     amiodarone 200 MG tablet  Commonly known as:  PACERONE  Take 100 mg by mouth daily. Take 1 tablet once daily for 7 days, then take 1/2 tablet daily     aspirin 81 MG tablet  Take 81 mg by mouth daily.     clopidogrel 75 MG tablet  Commonly known as:  PLAVIX  Take 75 mg by mouth daily.     diclofenac sodium 1 % Gel  Commonly known as:  VOLTAREN  Apply 2 g topically 4 (four) times daily.     docusate sodium 100 MG capsule  Commonly known as:  COLACE  Take 100 mg by mouth 2 (two) times daily.     doxycycline 100 MG capsule  Commonly known as:  VIBRAMYCIN  Take 1 capsule (100 mg total) by mouth daily.     gabapentin 100 MG capsule  Commonly known as:  NEURONTIN  Take 100 mg by mouth 2 (two) times daily.     lactulose 10 GM/15ML solution  Commonly known as:  CHRONULAC  Take 10 g by mouth every 2 (two) hours as needed. Until diarrhea     levofloxacin 250 MG tablet  Commonly known as:  LEVAQUIN  Take 1 tablet (250 mg total) by mouth daily.     levothyroxine 50 MCG tablet  Commonly known as:  SYNTHROID, LEVOTHROID  Take 50 mcg by mouth daily.     lidocaine 5 %  Commonly known as:  LIDODERM  Place 1 patch onto the skin daily. Remove & Discard patch within 12 hours or as directed by MD     lovastatin 20 MG  tablet  Commonly known as:  MEVACOR  Take 20 mg by mouth at bedtime.     mirtazapine 15 MG tablet  Commonly known as:  REMERON  Take 15 mg by mouth at bedtime.     omeprazole 20 MG capsule  Commonly known as:  PRILOSEC  Take 20 mg by mouth 2 (two) times daily before a meal.     RENVELA 800 MG tablet  Generic drug:  sevelamer carbonate  Take 2,400 mg by mouth 3 (three) times daily with meals.     rOPINIRole 0.25 MG tablet  Commonly known as:  REQUIP  Take 0.25 mg by mouth daily.        Disposition and follow-up:   Ms.Valerie Yoder was discharged from [facility] in good condition.  At the hospital follow up visit please address:  1.  Patient still with diarrhea, and rectal pain? Scheduled for sigmoidoscopy/colonoscopy? Patient with abdominal pain, fever, chills, nausea or vomiting?   2.  Labs / imaging needed at time of follow-up: CBC, CMP, CT abdomen pelvis in ~6 months for follow up of renal angiomyolipoma.  3.  Pending labs/ test needing follow-up: none  Follow-up Appointments:  Discharge Instructions: Discharge Orders   Future Orders Complete By Expires   Call MD for:  difficulty breathing, headache or visual disturbances  As directed    Call MD for:  extreme fatigue  As directed    Call MD for:  persistant dizziness or light-headedness  As directed    Call MD for:  persistant nausea and vomiting  As directed       Consultations: Treatment Team:  Maree Krabbe, MD  Procedures Performed:  Dg Abd 1 View  01/04/2013   CLINICAL DATA:  Abdominal pain and vomiting. Status post placement of peritoneal catheter 3 days ago.  EXAM: ABDOMEN - 1 VIEW  COMPARISON:  CT abdomen and pelvis 12/05/2011.  FINDINGS: Peritoneal dialysis catheter from a left lower quadrant approach is in place with the tip in the pelvis. There is a large volume of stool throughout the colon. No evidence of bowel obstruction is seen. No abnormal abdominal calcification is  identified. Convex left scoliosis is noted.  IMPRESSION: No acute finding.  Peritoneal dialysis catheter in place without evidence of complication.  Large volume of stool throughout the colon.   Electronically Signed   By: Drusilla Kanner M.D.   On: 01/04/2013 13:43   Ct Abdomen Pelvis W Contrast  01/04/2013   CLINICAL DATA:  Abdominal pain. Constipation and now diarrhea, fever, rectal pain and bleeding.  EXAM: CT ABDOMEN AND PELVIS WITH CONTRAST  TECHNIQUE: Multidetector CT imaging of the abdomen and pelvis was performed using the standard protocol following bolus administration of intravenous contrast.  CONTRAST:  OMNIPAQUE IOHEXOL 300 MG/ML  SOLN  COMPARISON:  12/05/2011  FINDINGS: The lung bases demonstrate by lateral linear density likely atelectasis with small amount of right pleural fluid. Coronary artery calcifications are present. There is mild cardiomegaly.  Abdominal images demonstrate evidence of patient's peritoneal dialysis catheter entering the left lower quadrant with tip over the pelvis just left of midline without significant change. There is mild ascites present likely due to patient's peritoneal dialysis slightly more pronounced compared to the prior exam. The liver, spleen, pancreas, gallbladder and adrenal glands are within normal. There is absence of the right kidney. The left kidney demonstrates a well-defined 1.9 cm homogeneously hypodense exophytic mass over the upper pole with Hounsfield unit measurements of 24 unchanged in size since 2012 and likely a slightly hyperdense cyst. There is continued evidence of a 2.3 cm solid and fatty mass over the superior pole of the left kidney as this is unchanged in size compared to 2012, although demonstrates slightly less fat component. Appendix is not definitely seen. There is moderate calcified atherosclerotic plaque of the abdominal aorta iliac vessels.  There is no evidence of bowel obstruction. Air is present throughout the colon.  There are a few small flecks of free air over the anterior aspect of the left abdomen just above the peritoneal dialysis catheter entry point without change from the prior exams.  Pelvic images demonstrate suggestion mild wall thickening over the distal rectal region. There is no change in mild compression deformity of T12 as there is also a mild compression deformity of T9 age-indeterminate.  IMPRESSION: Mild wall thickening over the distal rectum which may be due to proctitis.  Left-sided peritoneal dialysis catheter with tip over the pelvis just left of midline without significant change. Ascites slightly worse.  Mild bibasilar atelectatic change and small amount of right pleural fluid.  Probable hyperdense cyst over the upper  pole of the left kidney unchanged from 2012. 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 Recommend followup CT in 6 months.  Stable T12 compression fracture with mild T9 compression fracture age indeterminate.   Electronically Signed   By: Elberta Fortis M.D.   On: 01/04/2013 17:54   Dg Chest Port 1 View  01/05/2013   CLINICAL DATA:  Central line placement.  EXAM: PORTABLE CHEST - 1 VIEW  COMPARISON:  04/24/2012.  FINDINGS: Left IJ catheter noted with tip projected over superior vena cava. Mild atelectasis left lung base. Lungs are clear. No pneumothorax. Heart size normal.  IMPRESSION: 1. Interim placement of left IJ line, its tip is projected over superior vena cava. 2. Mild subsegmental atelectasis left lung base.   Electronically Signed   By: Maisie Fus  Register   On: 01/05/2013 14:09    2D Echo: 01/06/13  Study Conclusions: - Left ventricle: The cavity size was normal. Systolic function was vigorous. The estimated ejection fraction was in the range of 65% to 70%. Wall motion was  normal; there were no regional wall motion abnormalities. Doppler parameters are consistent with abnormal left ventricular relaxation (grade 1 diastolic dysfunction). - Aortic valve: Mild regurgitation. - Mitral valve: Moderately calcified annulus. Mild regurgitation.  Admission HPI:  ANNALYSA MOHAMMAD is a 79 year old white female with PMH of ESRD on peritoneal dialysis x4 years, HTN, DMT2 (diet controlled), PAF, HTD, CHF (no echo on file). Patient reports that last week she was constipated and saw her PCP, she was prescribed lactulose for constipation. She subsequently tried to disimpact herself on Sunday night and may have cut herself in that area with her fingernail she claims. She reports that since then she has had multiple 5-6 small loose BMs a day and has noticed occasional blood in her stool. Since then, she endorses mild diffuse abdominal pain x3 days that is associated with fever and chills (Tmax at home 102) and decreased appetite. She is most concerned with rectal pain that started after her attempted self disimpaction several nights ago.  Nephrologist: Dr Allena Katz. She endorses daily PD ~10 hours a day with baseline hypotension with SBP 70-90s.   Hospital Course by problem list:   1. Peritoneal Dialysis Related Peritonitis- Patient with history of ESRD on peritoneal dialysis presented with 3-4 days history of fever/chills, diffuse abdominal pain, fatigue, and found to have a leukocytosis of 35k, lactic acid of 2.6. Baseline hypotension per patient with SBP 70-90s. Nephrology was consulted in the ED and obtained a peritoneal fluid cell count and culture, cell count revealed a WBC count of 1014, w/ PMN's of 64%, confirming peritonitis. Patient w/ previous history of peritonitis (3x), most recently +ve for bacillus in 10/2012. CT scan performed on admission, showed  left-sided peritoneal dialysis catheter with tip over the pelvis just left of midline without significant change and worsened  ascites. Other CT findings discussed in following problems. Patient started on Vancomycin and Levaquin (had a PCN allergy). WBC trend went as follows; 24.8-->22.9-->21.0-->19.2-->15.6-->12.2-->10.9-->9.8. On 01/05/13, patient lost peripheral IV and had LIJ central line placed. Repeat lactic acid was 2.0 and lipase wnl. On 01/08/13, changed to po Levaquin 250 mg po qd and Doxycycline 100 mg po qd. Discharged on 01/09/13.  2. Anemia w/ Hypotension- Patient w/ Hb  of 9.0 on admission. Then found to be 6.8. Patient also w/ hypotension, but patient claims she is always 80/40-50's. No obvious signs of bleeding. Patient did not appear severely septic.Continued giving 500 cc boluses for the first day of hospitalization, then d/c'ed. Given 1 unit PRBCs on 01/05/13. Post-transfusion CBC 8.1.  3. ESRD- Patient has been on peritoneal dialysis x 4 years w/ 3 previous episodes of peritonitis, most recently in 10/2012, gram +ve bacillus. She was treated with IP vancomycin at that time, cell count which was 7478 at onset of infection improved down to "0". Continued PD during hospitalization w/out complications. Discharge Cr trend as follows:  Recent Labs Lab 01/07/13 0628 01/08/13 0435 01/09/13 0507  CREATININE 4.26* 4.75* 4.57*   4. Proctitis- Patient reports that last week she was constipated and saw her PCP, she was prescribed lactulose for constipation. She subsequently tried to disimpact herself and may have caused damage to her rectum with her fingernail she claims. She reports that since then she has had multiple 5-6 small loose BMs a day and has noticed occasional blood in her stool. CT scan performed on admission, showed mild wall thickening over the distal rectum which may be due to proctitis. C. Dif -ve. GI consulted, suggested outpatient evaluation w/ flex sig or colonoscopy. Constipation managed w/ enema and stool softeners.   5. H/o A-fib- Previously on coumadin, now on plavix. Also on amiodarone. In  sinus rhythm on admission. Held plavix in setting of rectal bleeding. Continued amiodarone during admission.  6. Osteoporosis- Patient has ESRD. Stable T 12 compression fracture, mild T9 compression fracture. Alk phos elevated without elevation of AST/ALT.   7. Protein Calorie Malnutrition- Patient admits decreased PO intake x 2 days. Albumin 2.3 on admission. Nutrition consulted.  8. Presumed Renal Angiomyolipoma- CT abdomen on admission showed probable hyperdense cyst over the upper pole of the left kidney unchanged from 2012. 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 Recommend followup CT in 6 months  Discharge Vitals:   BP 92/58  Pulse 80  Temp(Src) 98.1 F (36.7 C) (Oral)  Resp 16  Ht 5\' 3"  (1.6 m)  Wt 121 lb 7.6 oz (55.1 kg)  BMI 21.52 kg/m2  SpO2 92%  Discharge Labs:  No results found for this or any previous visit (from the past 24 hour(s)).  Signed: Courtney Paris, MD 01/13/2013, 5:05 PM   Time Spent on Discharge: 45 minutes Services Ordered on Discharge: none Equipment Ordered on Discharge: none

## 2013-01-09 NOTE — Progress Notes (Signed)
Home Health  PT Referral to [facility]  [phone] spoke with Rayfield Citizen

## 2013-01-09 NOTE — Progress Notes (Signed)
Subjective: Ms. Mudry is a 79 year old female with a PMHx of ESRD on peritoneal dialysis, admitted for peritonitis.  Patient seen at bedside this AM. Feeling much better today, slept well, denies any abdominal pain, nausea, vomiting. Still with mild diarrhea, intermixed w/ some solid stool.  Objective: Vital signs in last 24 hours: Filed Vitals:   01/08/13 2018 01/09/13 1000 01/09/13 1135 01/09/13 1400  BP: 103/66 100/64  92/58  Pulse: 73 85  80  Temp: 98.5 F (36.9 C) 98.2 F (36.8 C)  98.1 F (36.7 C)  TempSrc: Oral Oral  Oral  Resp: 18 18  16   Height: 5\' 3"  (1.6 m)     Weight: 131 lb 3.2 oz (59.512 kg)  121 lb 7.6 oz (55.1 kg)   SpO2: 96% 92%  92%   Weight change: -5 lb 0.7 oz (-2.288 kg)  Intake/Output Summary (Last 24 hours) at 01/09/13 2028 Last data filed at 01/09/13 0900  Gross per 24 hour  Intake      0 ml  Output      1 ml  Net     -1 ml   Physical Exam: General: Alert, cooperative, and in no apparent distress HEENT: Vision grossly intact, oropharynx clear and non-erythematous  Neck: Full range of motion without pain, supple, no lymphadenopathy or carotid bruits Lungs: Clear to ascultation bilaterally, normal work of respiration, no wheezes, rales, ronchi Heart: Regular rate and rhythm, no murmurs, gallops, or rubs Abdomen: Soft, non-tender, mildly distended, normal bowel sounds. Peritoneal dialysis site clean, dry, intact. Extremities: No cyanosis, clubbing, or edema Neurologic: Alert & oriented X3, cranial nerves II-XII intact, strength grossly intact, sensation intact to light touch  Lab Results: Basic Metabolic Panel:  Recent Labs Lab 01/05/13 0434  01/08/13 0435 01/09/13 0507  NA 127*  < > 129* 128*  K 3.1*  < > 4.0 3.6  CL 89*  < > 95* 92*  CO2 22  < > 22 23  GLUCOSE 97  < > 99 107*  BUN 41*  < > 29* 27*  CREATININE 5.52*  < > 4.75* 4.57*  CALCIUM 8.1*  < > 8.1* 7.8*  MG 1.5  --   --  1.4*  PHOS 3.8  < > 2.8 2.6  < > = values in this  interval not displayed. Liver Function Tests:  Recent Labs Lab 01/04/13 1311  01/08/13 0435 01/09/13 0507  AST 20  --   --   --   ALT 12  --   --   --   ALKPHOS 153*  --   --   --   BILITOT 0.4  --   --   --   PROT 6.5  --   --   --   ALBUMIN 2.3*  < > 1.6* 1.5*  < > = values in this interval not displayed.  Recent Labs Lab 01/04/13 1311  LIPASE 17   CBC:  Recent Labs Lab 01/08/13 0435 01/09/13 0507  WBC 10.9* 9.8  NEUTROABS 8.2* 7.3  HGB 8.2* 8.1*  HCT 25.2* 24.9*  MCV 88.7 89.2  PLT 346 359   Cardiac Enzymes:  Recent Labs Lab 01/04/13 0013 01/05/13 0434 01/05/13 1200  TROPONINI <0.30 <0.30 <0.30   CBG:  Recent Labs Lab 01/05/13 1200 01/05/13 2211 01/06/13 0831 01/07/13 0809 01/08/13 0756 01/09/13 0735  GLUCAP 148* 145* 109* 105* 123* 124*   Coagulation:  Recent Labs Lab 01/04/13 1311  LABPROT 14.6  INR 1.16   Micro Results:  Recent Results (from the past 240 hour(s))  BODY FLUID CULTURE     Status: None   Collection Time    01/04/13  4:12 PM      Result Value Range Status   Specimen Description PERITONEAL DIALYSATE   Final   Special Requests NONE   Final   Gram Stain     Final   Value: NO WBC SEEN     NO ORGANISMS SEEN     Performed at [facility]   Culture     Final   Value: NO GROWTH 3 DAYS     Performed at [facility]   Report Status 01/07/2013 FINAL   Final  CULTURE, BLOOD (ROUTINE X 2)     Status: None   Collection Time    01/04/13  6:05 PM      Result Value Range Status   Specimen Description BLOOD HAND RIGHT   Final   Special Requests BOTTLES DRAWN AEROBIC ONLY 3CC   Final   Culture  Setup Time     Final   Value: 01/04/2013 22:14     Performed at [facility]   Culture     Final   Value:        BLOOD CULTURE RECEIVED NO GROWTH TO DATE CULTURE WILL BE HELD FOR 5 DAYS BEFORE ISSUING A FINAL NEGATIVE REPORT     Performed at [facility]   Report Status PENDING   Incomplete  CULTURE,  BLOOD (ROUTINE X 2)     Status: None   Collection Time    01/04/13  7:00 PM      Result Value Range Status   Specimen Description BLOOD HAND LEFT   Final   Special Requests BOTTLES DRAWN AEROBIC ONLY 5CC   Final   Culture  Setup Time     Final   Value: 01/05/2013 03:58     Performed at [facility]   Culture     Final   Value:        BLOOD CULTURE RECEIVED NO GROWTH TO DATE CULTURE WILL BE HELD FOR 5 DAYS BEFORE ISSUING A FINAL NEGATIVE REPORT     Performed at [facility]   Report Status PENDING   Incomplete  MRSA PCR SCREENING     Status: None   Collection Time    01/04/13 10:12 PM      Result Value Range Status   MRSA by PCR NEGATIVE  NEGATIVE Final   Comment:            The GeneXpert MRSA Assay (FDA     approved for NASAL specimens     only), is one component of a     comprehensive MRSA colonization     surveillance program. It is not     intended to diagnose MRSA     infection nor to guide or     monitor treatment for     MRSA infections.  CLOSTRIDIUM DIFFICILE BY PCR     Status: None   Collection Time    01/05/13  2:52 AM      Result Value Range Status   C difficile by pcr NEGATIVE  NEGATIVE Final    Assessment/Plan: Ms. Valerie Yoder is a 79 year old female with a PMHx of ESRD on peritoneal dialysis, admitted for possible PD related peritonitis.  PD related Peritonitis- Patient with history of ESRD on peritoneal dialysis. Found to have a leukocytosis of 35k, lactic acid of 2.6 on admission.  Baseline hypotension per patient with SBP 70-90s. Nephrology was consulted in the ED and obtained a peritoneal fluid cell count and culture, cell count revealed a WBC 1014, w/ 64% PMNs. WBC's continue to trend down as follows:  Recent Labs Lab 01/07/13 0628 01/08/13 0435 01/09/13 0507  HGB 8.3* 8.2* 8.1*  HCT 25.7* 25.2* 24.9*  WBC 12.2* 10.9* 9.8  PLT 359 346 359   -Antibiotics switched to Doxycycline 100 mg po qd + Levaquin 250 mg po qd for 5 more days on  discharge.  -Peritoneal fluid culture, Blood cultures; both -ve -Discussed w/ Dr. Allena Katz, feel patient is clear for discharge. Agree w/ ABx coverage on discharge.  Anemia w/ Hypotension- Patient w/ Hb of 9.0 on admission. Then found to be 6.8. Patient also w/ hypotension, but patient claims she is always 80/40-50's. No obvious signs of bleeding at this time. Still w/ LIJ central line. Recent Hb trend as above. 8.1 today, BP stable.  - Will continue to monitor.  End stage renal disease- Patient has been on peritoneal dialysis x 4 years.  - Nephrology consulted and following. - Continue peritoneal dialysis per renal.  - Electrolytes stable.  - Discussed w/ Dr. Allena Katz, feel patient is okay for discharge. Will continue PD  Proctitis- Patient describes attempting to disimpact herself 3 days prior to admission, since then she has had perirectal pain and seen some blood in stools. FOBT +. She was noted to have mild wall thickening of the distal rectum. She denies any history of UC or radiation. Claims she has been having BM's, some diarrhea mixed w/ some solid stool. Still with rectal pain.  - GI following, recommend flex sig or colonoscopy as an outpatient. - Continue Colace + Miralax - No sign of anal fissure on exam.  - C. Dif -ve - Continue to trend CBC. See above  Hx of Afib--Was previously on coumadin, now on plavix. Also on amiodarone. In sinus rhythm on admission.  - Holding plavix in setting of rectal bleeding  - Continue amiodarone   Osteoporosis- Patient has ESRD. Stable T 12 compression fracture, mild T9 compression fracture. Alk phos elevated without elevation of AST/ALT.  - Management as outpatient.   Malnutrition- Patient admits decreased PO intake x 2 days prior to admission w/ Albumin 2.3. Albumin 1.6 most recently.  - Started Boost plus b/w meals.  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  Anticipated discharge today.   The patient does have a current PCP (Amy Moon, NP) and does need an [facility] hospital follow-up appointment after discharge.  The patient does not have transportation limitations that hinder transportation to clinic appointments.  .Services Needed at time of discharge: Y = Yes, Blank = No PT:   OT:   RN:   Equipment:   Other:     LOS: 5 days   Courtney Paris, MD 01/09/2013, 8:28 PM Pager: [phone]

## 2013-01-09 NOTE — Progress Notes (Addendum)
Physical Therapy Treatment Patient Details Name: Valerie Yoder MRN: [id number] DOB: [birth year] Today's Date: 01/09/2013 Time: 4098-1191 PT Time Calculation (min): 26 min  PT Assessment / Plan / Recommendation  History of Present Illness Pt is a 79 y.o. Female adm secondary to having multiple loose BMs, blood in stool and abdominal pain. PMH includes ESRD on peritoneal dialysis, HTN, DMT2, PAF, CHF, HTD.     PT Comments   Pt needs max motivation to get OOB.  However pt to d/c today and educated pt on the importance of getting OOB and for therapy to assess overall mobility for safe d/c home.  Pt agreeable to get OOB to recliner.  Pt continues to need (A) to get OOB however will have assistance at home 24/7.  Highly recommend HHPT at d/c.  Pt will need (A) to get in/out w/c <> car.  Pt will need to use w/c to get into house.   Follow Up Recommendations  Home health PT;Supervision/Assistance - 24 hour;Other (comment)     Equipment Recommendations  None recommended by PT    Frequency Min 3X/week   Progress towards PT Goals Progress towards PT goals: Not progressing toward goals - comment (Pt agreeable to get OOB but does not want to ambulate)  Plan Discharge plan needs to be updated    Precautions / Restrictions Precautions Precautions: Fall Precaution Comments: fell 2 weeks ago Restrictions Weight Bearing Restrictions: No   Pertinent Vitals/Pain No c/o pain    Mobility  Bed Mobility Bed Mobility: Supine to Sit;Sitting - Scoot to Edge of Bed;Sit to Supine Supine to Sit: 4: Min assist;HOB flat;With rails Sitting - Scoot to Edge of Bed: 5: Supervision Details for Bed Mobility Assistance: pt required (A) to bring shoulders to upright sitting position and to advance LEs back onto bed to return to supine secondary to generalized weakness; cues for hand placement and sequencing  Transfers Transfers: Sit to Stand;Stand to Sit Sit to Stand: From bed;With upper extremity assist;4: Min  assist Stand to Sit: 3: Mod assist;To chair/3-in-1;With upper extremity assist;With armrests Details for Transfer Assistance: (A) to achieve upright standing position and maintain balance; pt unsteady and demo decreased strength in LEs; pt unable to eccentrically control descent into sitting position; cues for hand placement and sequencing  Ambulation/Gait Ambulation/Gait Assistance: 3: Mod assist Ambulation Distance (Feet): 3 Feet Assistive device: 1 person hand held assist Ambulation/Gait Assistance Details: pt unsteady with gt; reaching for external objects to brace herself; difficulty weightshifting secondary to pain; pt braced with handheld (A) and with (A) around waist for safety  Gait Pattern: Step-to pattern;Decreased stance time - right;Wide base of support Gait velocity: very decreased Stairs: No Wheelchair Mobility Wheelchair Mobility: No    Exercises     PT Diagnosis:    PT Problem List:   PT Treatment Interventions:     PT Goals (current goals can now be found in the care plan section) Acute Rehab PT Goals Patient Stated Goal: to go home with my family PT Goal Formulation: With patient Time For Goal Achievement: 01/22/13 Potential to Achieve Goals: Good  Visit Information  Last PT Received On: 01/09/13 Assistance Needed: +1 History of Present Illness: Pt is a 79 y.o. Female adm secondary to having multiple loose BMs, blood in stool and abdominal pain. PMH includes ESRD on peritoneal dialysis, HTN, DMT2, PAF, CHF, HTD.      Subjective Data  Patient Stated Goal: to go home with my family   Cognition  Cognition Arousal/Alertness: Awake/alert Behavior  During Therapy: WFL for tasks assessed/performed Overall Cognitive Status: Within Functional Limits for tasks assessed    Balance  Balance Balance Assessed: Yes Static Standing Balance Static Standing - Balance Support: Left upper extremity supported;During functional activity Static Standing - Level of Assistance:  4: Min assist  End of Session PT - End of Session Equipment Utilized During Treatment: Gait belt Activity Tolerance: Patient tolerated treatment well Patient left: in bed;with call bell/phone within reach;with bed alarm set Nurse Communication: Mobility status   GP     Elenie Coven 01/09/2013, 3:40 PM  Jake Shark, PT DPT [phone]

## 2013-01-09 NOTE — Progress Notes (Signed)
Patient discharge teaching given, including activity, diet, follow-up appoints, and medications. Patient verbalized understanding of all discharge instructions. IV access was d/c'd. Vitals are stable. Skin is intact except as charted in most recent assessments. Pt to be escorted out by RN, to be driven home by family.  Cyncere Sontag, MBA, BS, RN 

## 2013-01-10 LAB — CULTURE, BLOOD (ROUTINE X 2)

## 2013-01-11 LAB — CULTURE, BLOOD (ROUTINE X 2): Culture: NO GROWTH

## 2013-01-13 NOTE — Discharge Summary (Signed)
  Date: 01/13/2013  Patient name: Valerie Yoder  Medical record number: [id number]  Date of birth: 1930-11-30   This patient has been seen and the plan of care was discussed with the house staff. Please see their note for complete details. I concur with their findings and plan.  Jonah Blue, DO, FACP Faculty [facility] Res[facility] 01/13/2013, 7:54 PM

## 2013-06-28 ENCOUNTER — Inpatient Hospital Stay (HOSPITAL_COMMUNITY)
Admission: EM | Admit: 2013-06-28 | Discharge: 2013-07-03 | DRG: 919 | Disposition: A | Discharge status: Home / Self Care | Payer: Medicare Other | Attending: Internal Medicine | Admitting: Internal Medicine

## 2013-06-28 ENCOUNTER — Encounter (HOSPITAL_COMMUNITY): Payer: Self-pay | Admitting: Emergency Medicine

## 2013-06-28 ENCOUNTER — Emergency Department (HOSPITAL_COMMUNITY): Payer: Medicare Other

## 2013-06-28 DIAGNOSIS — E440 Moderate protein-calorie malnutrition: Secondary | ICD-10-CM | POA: Diagnosis present

## 2013-06-28 DIAGNOSIS — T8571XA Infection and inflammatory reaction due to peritoneal dialysis catheter, initial encounter: Principal | ICD-10-CM | POA: Diagnosis present

## 2013-06-28 DIAGNOSIS — N2581 Secondary hyperparathyroidism of renal origin: Secondary | ICD-10-CM | POA: Diagnosis present

## 2013-06-28 DIAGNOSIS — I9589 Other hypotension: Secondary | ICD-10-CM | POA: Diagnosis present

## 2013-06-28 DIAGNOSIS — I519 Heart disease, unspecified: Secondary | ICD-10-CM

## 2013-06-28 DIAGNOSIS — N186 End stage renal disease: Secondary | ICD-10-CM | POA: Diagnosis present

## 2013-06-28 DIAGNOSIS — K658 Other peritonitis: Secondary | ICD-10-CM | POA: Diagnosis present

## 2013-06-28 DIAGNOSIS — E43 Unspecified severe protein-calorie malnutrition: Secondary | ICD-10-CM | POA: Insufficient documentation

## 2013-06-28 DIAGNOSIS — I509 Heart failure, unspecified: Secondary | ICD-10-CM | POA: Diagnosis present

## 2013-06-28 DIAGNOSIS — Z66 Do not resuscitate: Secondary | ICD-10-CM | POA: Diagnosis not present

## 2013-06-28 DIAGNOSIS — I2510 Atherosclerotic heart disease of native coronary artery without angina pectoris: Secondary | ICD-10-CM | POA: Diagnosis present

## 2013-06-28 DIAGNOSIS — R000 Tachycardia, unspecified: Secondary | ICD-10-CM

## 2013-06-28 DIAGNOSIS — Y841 Kidney dialysis as the cause of abnormal reaction of the patient, or of later complication, without mention of misadventure at the time of the procedure: Secondary | ICD-10-CM | POA: Diagnosis present

## 2013-06-28 DIAGNOSIS — Z7982 Long term (current) use of aspirin: Secondary | ICD-10-CM

## 2013-06-28 DIAGNOSIS — I5032 Chronic diastolic (congestive) heart failure: Secondary | ICD-10-CM | POA: Diagnosis present

## 2013-06-28 DIAGNOSIS — N039 Chronic nephritic syndrome with unspecified morphologic changes: Secondary | ICD-10-CM

## 2013-06-28 DIAGNOSIS — Z992 Dependence on renal dialysis: Secondary | ICD-10-CM

## 2013-06-28 DIAGNOSIS — I120 Hypertensive chronic kidney disease with stage 5 chronic kidney disease or end stage renal disease: Secondary | ICD-10-CM | POA: Diagnosis present

## 2013-06-28 DIAGNOSIS — R627 Adult failure to thrive: Secondary | ICD-10-CM | POA: Diagnosis present

## 2013-06-28 DIAGNOSIS — E876 Hypokalemia: Secondary | ICD-10-CM | POA: Diagnosis present

## 2013-06-28 DIAGNOSIS — R109 Unspecified abdominal pain: Secondary | ICD-10-CM

## 2013-06-28 DIAGNOSIS — D631 Anemia in chronic kidney disease: Secondary | ICD-10-CM | POA: Diagnosis present

## 2013-06-28 DIAGNOSIS — D649 Anemia, unspecified: Secondary | ICD-10-CM | POA: Diagnosis present

## 2013-06-28 DIAGNOSIS — I959 Hypotension, unspecified: Secondary | ICD-10-CM

## 2013-06-28 DIAGNOSIS — I5189 Other ill-defined heart diseases: Secondary | ICD-10-CM | POA: Diagnosis present

## 2013-06-28 DIAGNOSIS — E119 Type 2 diabetes mellitus without complications: Secondary | ICD-10-CM | POA: Diagnosis present

## 2013-06-28 DIAGNOSIS — I4891 Unspecified atrial fibrillation: Secondary | ICD-10-CM | POA: Diagnosis present

## 2013-06-28 DIAGNOSIS — E871 Hypo-osmolality and hyponatremia: Secondary | ICD-10-CM | POA: Diagnosis present

## 2013-06-28 DIAGNOSIS — IMO0002 Reserved for concepts with insufficient information to code with codable children: Secondary | ICD-10-CM

## 2013-06-28 DIAGNOSIS — E039 Hypothyroidism, unspecified: Secondary | ICD-10-CM | POA: Diagnosis present

## 2013-06-28 DIAGNOSIS — D638 Anemia in other chronic diseases classified elsewhere: Secondary | ICD-10-CM | POA: Diagnosis present

## 2013-06-28 DIAGNOSIS — K659 Peritonitis, unspecified: Secondary | ICD-10-CM | POA: Diagnosis present

## 2013-06-28 HISTORY — DX: End stage renal disease: N18.6

## 2013-06-28 LAB — CBC WITH DIFFERENTIAL/PLATELET
BASOS PCT: 0 % (ref 0–1)
Basophils Absolute: 0 10*3/uL (ref 0.0–0.1)
EOS ABS: 0.1 10*3/uL (ref 0.0–0.7)
Eosinophils Relative: 1 % (ref 0–5)
HEMATOCRIT: 37.3 % (ref 36.0–46.0)
HEMOGLOBIN: 12.3 g/dL (ref 12.0–15.0)
Lymphocytes Relative: 15 % (ref 12–46)
Lymphs Abs: 2.2 10*3/uL (ref 0.7–4.0)
MCH: 29 pg (ref 26.0–34.0)
MCHC: 33 g/dL (ref 30.0–36.0)
MCV: 88 fL (ref 78.0–100.0)
MONO ABS: 1.6 10*3/uL — AB (ref 0.1–1.0)
MONOS PCT: 11 % (ref 3–12)
NEUTROS ABS: 10.5 10*3/uL — AB (ref 1.7–7.7)
Neutrophils Relative %: 73 % (ref 43–77)
Platelets: 227 10*3/uL (ref 150–400)
RBC: 4.24 MIL/uL (ref 3.87–5.11)
RDW: 17.9 % — ABNORMAL HIGH (ref 11.5–15.5)
WBC: 14.5 10*3/uL — ABNORMAL HIGH (ref 4.0–10.5)

## 2013-06-28 LAB — COMPREHENSIVE METABOLIC PANEL
ALBUMIN: 2.2 g/dL — AB (ref 3.5–5.2)
ALT: 10 U/L (ref 0–35)
AST: 17 U/L (ref 0–37)
Alkaline Phosphatase: 103 U/L (ref 39–117)
BUN: 28 mg/dL — ABNORMAL HIGH (ref 6–23)
CHLORIDE: 89 meq/L — AB (ref 96–112)
CO2: 23 mEq/L (ref 19–32)
Calcium: 9.5 mg/dL (ref 8.4–10.5)
Creatinine, Ser: 5.84 mg/dL — ABNORMAL HIGH (ref 0.50–1.10)
GFR calc Af Amer: 7 mL/min — ABNORMAL LOW (ref >90)
GFR calc non Af Amer: 6 mL/min — ABNORMAL LOW (ref >90)
GLUCOSE: 145 mg/dL — AB (ref 70–99)
Potassium: 4.4 mEq/L (ref 3.7–5.3)
Sodium: 133 mEq/L — ABNORMAL LOW (ref 137–147)
Total Bilirubin: 0.9 mg/dL (ref 0.3–1.2)
Total Protein: 6.5 g/dL (ref 6.0–8.3)

## 2013-06-28 LAB — PROTEIN, BODY FLUID: Total protein, fluid: 0.2 g/dL

## 2013-06-28 LAB — GRAM STAIN: Special Requests: NORMAL

## 2013-06-28 LAB — GLUCOSE, PERITONEAL FLUID: Glucose, Peritoneal Fluid: 302 mg/dL

## 2013-06-28 LAB — BODY FLUID CELL COUNT WITH DIFFERENTIAL: Total Nucleated Cell Count, Fluid: 8 cu mm (ref 0–1000)

## 2013-06-28 LAB — MAGNESIUM: MAGNESIUM: 1.5 mg/dL (ref 1.5–2.5)

## 2013-06-28 LAB — PHOSPHORUS: PHOSPHORUS: 5 mg/dL — AB (ref 2.3–4.6)

## 2013-06-28 LAB — LACTIC ACID, PLASMA: Lactic Acid, Venous: 4.8 mmol/L — ABNORMAL HIGH (ref 0.5–2.2)

## 2013-06-28 LAB — MRSA PCR SCREENING: MRSA BY PCR: NEGATIVE

## 2013-06-28 LAB — TROPONIN I: Troponin I: <0.3 ng/mL (ref <0.30)

## 2013-06-28 MED ORDER — SEVELAMER CARBONATE 800 MG PO TABS
2400.0000 mg | ORAL_TABLET | Freq: Three times a day (TID) | ORAL | Status: DC
  Administered 2013-06-30: 800 mg via ORAL
  Administered 2013-07-01: 2400 mg via ORAL
  Filled 2013-06-28 (×16): qty 3

## 2013-06-28 MED ORDER — ONDANSETRON HCL 4 MG PO TABS
4.0000 mg | ORAL_TABLET | Freq: Four times a day (QID) | ORAL | Status: DC | PRN
  Administered 2013-06-29: 4 mg via ORAL
  Filled 2013-06-28: qty 1

## 2013-06-28 MED ORDER — RIFAMPIN 300 MG PO CAPS
300.0000 mg | ORAL_CAPSULE | Freq: Every day | ORAL | Status: DC
  Administered 2013-06-29 – 2013-07-03 (×5): 300 mg via ORAL
  Filled 2013-06-28 (×5): qty 1

## 2013-06-28 MED ORDER — SODIUM CHLORIDE 0.9 % IJ SOLN: 3.0000 mL | INTRAMUSCULAR | Status: DC | PRN

## 2013-06-28 MED ORDER — AMIODARONE HCL 100 MG PO TABS
100.0000 mg | ORAL_TABLET | Freq: Every day | ORAL | Status: DC
  Administered 2013-06-28 – 2013-07-03 (×6): 100 mg via ORAL
  Filled 2013-06-28 (×6): qty 1

## 2013-06-28 MED ORDER — DICLOFENAC SODIUM 1 % TD GEL
2.0000 g | Freq: Four times a day (QID) | TRANSDERMAL | Status: DC
  Administered 2013-07-01 – 2013-07-03 (×4): 2 g via TOPICAL
  Filled 2013-06-28: qty 100

## 2013-06-28 MED ORDER — SIMVASTATIN 20 MG PO TABS
20.0000 mg | ORAL_TABLET | Freq: Every day | ORAL | Status: DC
  Administered 2013-06-28 – 2013-07-02 (×5): 20 mg via ORAL
  Filled 2013-06-28 (×6): qty 1

## 2013-06-28 MED ORDER — SODIUM CHLORIDE 0.9 % IV BOLUS (SEPSIS)
500.0000 mL | Freq: Once | INTRAVENOUS | Status: AC
  Administered 2013-06-28: 500 mL via INTRAVENOUS

## 2013-06-28 MED ORDER — MEGESTROL ACETATE 40 MG/ML PO SUSP
400.0000 mg | Freq: Every day | ORAL | Status: DC
  Administered 2013-06-29 – 2013-07-02 (×3): 400 mg via ORAL
  Filled 2013-06-28 (×5): qty 10

## 2013-06-28 MED ORDER — LEVOTHYROXINE SODIUM 75 MCG PO TABS
75.0000 ug | ORAL_TABLET | Freq: Every day | ORAL | Status: DC
Start: 2013-06-29 — End: 2013-07-03
  Administered 2013-06-29 – 2013-07-03 (×5): 75 ug via ORAL
  Filled 2013-06-28 (×6): qty 1

## 2013-06-28 MED ORDER — SODIUM CHLORIDE 0.9 % IV SOLN: 250.0000 mL | INTRAVENOUS | Status: DC | PRN

## 2013-06-28 MED ORDER — MIRTAZAPINE 15 MG PO TABS
15.0000 mg | ORAL_TABLET | Freq: Every day | ORAL | Status: DC
  Administered 2013-06-28 – 2013-07-02 (×5): 15 mg via ORAL
  Filled 2013-06-28 (×6): qty 1

## 2013-06-28 MED ORDER — SODIUM CHLORIDE 0.9 % IJ SOLN
3.0000 mL | Freq: Two times a day (BID) | INTRAMUSCULAR | Status: DC
  Administered 2013-06-28 – 2013-06-30 (×4): 3 mL via INTRAVENOUS

## 2013-06-28 MED ORDER — PANTOPRAZOLE SODIUM 40 MG PO TBEC
40.0000 mg | DELAYED_RELEASE_TABLET | Freq: Every day | ORAL | Status: DC
  Administered 2013-06-29 – 2013-07-03 (×5): 40 mg via ORAL
  Filled 2013-06-28 (×4): qty 1

## 2013-06-28 MED ORDER — VANCOMYCIN HCL IN DEXTROSE 1-5 GM/200ML-% IV SOLN
1000.0000 mg | Freq: Once | INTRAVENOUS | Status: AC
  Administered 2013-06-28: 1000 mg via INTRAVENOUS
  Filled 2013-06-28: qty 200

## 2013-06-28 MED ORDER — ASPIRIN 81 MG PO CHEW
81.0000 mg | CHEWABLE_TABLET | Freq: Every day | ORAL | Status: DC
  Administered 2013-06-29 – 2013-07-03 (×4): 81 mg via ORAL
  Filled 2013-06-28 (×4): qty 1

## 2013-06-28 MED ORDER — GABAPENTIN 100 MG PO CAPS
100.0000 mg | ORAL_CAPSULE | Freq: Every evening | ORAL | Status: DC | PRN
  Administered 2013-06-29 (×2): 100 mg via ORAL
  Filled 2013-06-28: qty 2

## 2013-06-28 MED ORDER — HYDROCODONE-ACETAMINOPHEN 5-325 MG PO TABS
1.0000 | ORAL_TABLET | Freq: Four times a day (QID) | ORAL | Status: DC | PRN
  Administered 2013-06-30 – 2013-07-01 (×2): 1 via ORAL
  Filled 2013-06-28 (×3): qty 1

## 2013-06-28 MED ORDER — CLOPIDOGREL BISULFATE 75 MG PO TABS
75.0000 mg | ORAL_TABLET | Freq: Every day | ORAL | Status: DC
  Administered 2013-06-29 – 2013-07-03 (×5): 75 mg via ORAL
  Filled 2013-06-28 (×5): qty 1

## 2013-06-28 MED ORDER — ROPINIROLE HCL 0.25 MG PO TABS
0.2500 mg | ORAL_TABLET | Freq: Every day | ORAL | Status: DC
  Administered 2013-06-28 – 2013-07-02 (×5): 0.25 mg via ORAL
  Filled 2013-06-28 (×6): qty 1

## 2013-06-28 MED ORDER — ONDANSETRON HCL 4 MG/2ML IJ SOLN
4.0000 mg | Freq: Four times a day (QID) | INTRAMUSCULAR | Status: DC | PRN
  Administered 2013-06-28 – 2013-07-03 (×9): 4 mg via INTRAVENOUS
  Filled 2013-06-28 (×9): qty 2

## 2013-06-28 MED ORDER — DOCUSATE SODIUM 100 MG PO CAPS
100.0000 mg | ORAL_CAPSULE | Freq: Two times a day (BID) | ORAL | Status: DC
  Administered 2013-06-29 – 2013-07-02 (×5): 100 mg via ORAL
  Filled 2013-06-28 (×10): qty 1

## 2013-06-28 MED ORDER — RIFAMPIN 300 MG PO CAPS
300.0000 mg | ORAL_CAPSULE | Freq: Every day | ORAL | Status: DC
  Administered 2013-06-28: 300 mg via ORAL
  Filled 2013-06-28: qty 1

## 2013-06-28 MED ORDER — ACETAMINOPHEN 500 MG PO TABS
1000.0000 mg | ORAL_TABLET | Freq: Every day | ORAL | Status: DC | PRN
  Administered 2013-06-30: 1000 mg via ORAL
  Filled 2013-06-28: qty 2

## 2013-06-28 MED ORDER — ENOXAPARIN SODIUM 40 MG/0.4ML ~~LOC~~ SOLN
40.0000 mg | SUBCUTANEOUS | Status: DC
  Administered 2013-06-28: 40 mg via SUBCUTANEOUS
  Filled 2013-06-28 (×2): qty 0.4

## 2013-06-28 MED ORDER — POLYETHYLENE GLYCOL 3350 17 G PO PACK
17.0000 g | PACK | Freq: Every day | ORAL | Status: DC | PRN
  Filled 2013-06-28 (×2): qty 1

## 2013-06-28 MED ORDER — LEVOFLOXACIN IN D5W 500 MG/100ML IV SOLN
500.0000 mg | Freq: Once | INTRAVENOUS | Status: AC
  Administered 2013-06-28: 500 mg via INTRAVENOUS
  Filled 2013-06-28: qty 100

## 2013-06-28 MED ORDER — LEVOFLOXACIN IN D5W 500 MG/100ML IV SOLN
500.0000 mg | INTRAVENOUS | Status: DC
  Administered 2013-06-30 – 2013-07-02 (×2): 500 mg via INTRAVENOUS
  Filled 2013-06-28 (×2): qty 100

## 2013-06-28 NOTE — ED Notes (Signed)
Pt would like to wait until eating to take the rest of her medications. Has been "getting sick after taking them lately".

## 2013-06-28 NOTE — ED Notes (Signed)
Dr. Fredric Dine at the bedside

## 2013-06-28 NOTE — Consult Note (Signed)
Indication for Consultation:  Management of ESRD/hemodialysis; anemia, hypertension/volume and secondary hyperparathyroidism  HPI: Valerie Yoder is a 80 y.o. female who presented to the ED with a complaint of abdominal pain for a week. History of ESRD, currently on PD. She reports at least 6 episodes of peritonitis since November. She just completed a course of vancomycin IP and doxycyline on Monday and received fortaz IP yesterday. She was started on rifampin Tuesday and has taken 2 doses of  21 day course. She reports PD fluid is yellow since starting rifampin but did not appear cloudy, she last had PD last night without any complication. What prompted this visit to the hospital is a little unclear.  The PD nurse as an OP told her nephrologist, Dr. Posey Pronto that patient was having excrutiating pain at her PD cath site and also the nurse was concerned over her having low BP and being tachycardic.  The plan from our standpoint which she now agrees to is to place a PC and initiate HD, get PD cath removed thinking she has a biofilm which is not clearing with antibiotics.  Pt is hesitant but knows this is the only way. She will be admitted for further eval, will plan to have IR place perm cath so pt can transition to HD until infections have resolved.  Past Medical History  Diagnosis Date  . Hypertension   . Diabetes mellitus   . Renal failure   . Mitral valve prolapse   . Hyperlipidemia   . CHF (congestive heart failure)   . Atrial fibrillation   . Arthritis   . Cancer 07/02/11    Left face   Past Surgical History  Procedure Laterality Date  . Nephrectomy      right kidney  . Abdominal hysterectomy    . Appendectomy    . Tonsillectomy    . Av fistula placement  2010    left brachiocephalic AVF  . Peritoneal catheter insertion  2011   No family history on file. Social History:  reports that she has never smoked. She does not have any smokeless tobacco history on file. She reports that  she does not drink alcohol or use illicit drugs. Allergies  Allergen Reactions  . Latex   . Other     Pneumonia shot   . Penicillins   . Sulfa Antibiotics    Prior to Admission medications   Medication Sig Start Date End Date Taking? Authorizing Provider  acetaminophen (TYLENOL) 500 MG tablet Take 1,000 mg by mouth daily as needed for fever.   Yes Historical Provider, MD  amiodarone (PACERONE) 200 MG tablet Take 100 mg by mouth daily.    Yes Historical Provider, MD  aspirin 81 MG chewable tablet Chew 81 mg by mouth daily.   Yes Historical Provider, MD  clopidogrel (PLAVIX) 75 MG tablet Take 75 mg by mouth daily.   Yes Historical Provider, MD  diclofenac sodium (VOLTAREN) 1 % GEL Apply 2 g topically 4 (four) times daily.   Yes Historical Provider, MD  docusate sodium (COLACE) 100 MG capsule Take 100 mg by mouth 2 (two) times daily.   Yes Historical Provider, MD  epoetin alfa (EPOGEN,PROCRIT) 99833 UNIT/ML injection Inject 10,000 Units into the skin once a week.   Yes Historical Provider, MD  gabapentin (NEURONTIN) 100 MG capsule Take 100-200 mg by mouth at bedtime as needed (for pain).    Yes Historical Provider, MD  HYDROcodone-acetaminophen (NORCO/VICODIN) 5-325 MG per tablet Take 1 tablet by mouth every 6 (  six) hours as needed for moderate pain.   Yes Historical Provider, MD  levothyroxine (SYNTHROID, LEVOTHROID) 75 MCG tablet Take 75 mcg by mouth daily before breakfast.   Yes Historical Provider, MD  lovastatin (MEVACOR) 20 MG tablet Take 20 mg by mouth at bedtime.    Yes Historical Provider, MD  megestrol (MEGACE) 40 MG/ML suspension Take 400 mg by mouth daily.   Yes Historical Provider, MD  mirtazapine (REMERON) 15 MG tablet Take 15 mg by mouth at bedtime.   Yes Historical Provider, MD  omeprazole (PRILOSEC) 20 MG capsule Take 20 mg by mouth 2 (two) times daily before a meal.   Yes Historical Provider, MD  polyethylene glycol (MIRALAX / GLYCOLAX) packet Take 17 g by mouth daily as  needed for mild constipation.   Yes Historical Provider, MD  potassium chloride SA (K-DUR,KLOR-CON) 20 MEQ tablet Take 20 mEq by mouth 2 (two) times daily.   Yes Historical Provider, MD  rifampin (RIFADIN) 300 MG capsule Take 300 mg by mouth daily.   Yes Historical Provider, MD  rOPINIRole (REQUIP) 0.25 MG tablet Take 0.25 mg by mouth at bedtime.    Yes Historical Provider, MD  sevelamer carbonate (RENVELA) 800 MG tablet Take 2,400 mg by mouth 3 (three) times daily with meals.   Yes Historical Provider, MD   No current facility-administered medications for this encounter.   Current Outpatient Prescriptions  Medication Sig Dispense Refill  . acetaminophen (TYLENOL) 500 MG tablet Take 1,000 mg by mouth daily as needed for fever.      Marland Kitchen amiodarone (PACERONE) 200 MG tablet Take 100 mg by mouth daily.       Marland Kitchen aspirin 81 MG chewable tablet Chew 81 mg by mouth daily.      . clopidogrel (PLAVIX) 75 MG tablet Take 75 mg by mouth daily.      . diclofenac sodium (VOLTAREN) 1 % GEL Apply 2 g topically 4 (four) times daily.      Marland Kitchen docusate sodium (COLACE) 100 MG capsule Take 100 mg by mouth 2 (two) times daily.      Marland Kitchen epoetin alfa (EPOGEN,PROCRIT) 56387 UNIT/ML injection Inject 10,000 Units into the skin once a week.      . gabapentin (NEURONTIN) 100 MG capsule Take 100-200 mg by mouth at bedtime as needed (for pain).       Marland Kitchen HYDROcodone-acetaminophen (NORCO/VICODIN) 5-325 MG per tablet Take 1 tablet by mouth every 6 (six) hours as needed for moderate pain.      Marland Kitchen levothyroxine (SYNTHROID, LEVOTHROID) 75 MCG tablet Take 75 mcg by mouth daily before breakfast.      . lovastatin (MEVACOR) 20 MG tablet Take 20 mg by mouth at bedtime.       . megestrol (MEGACE) 40 MG/ML suspension Take 400 mg by mouth daily.      . mirtazapine (REMERON) 15 MG tablet Take 15 mg by mouth at bedtime.      Marland Kitchen omeprazole (PRILOSEC) 20 MG capsule Take 20 mg by mouth 2 (two) times daily before a meal.      . polyethylene glycol  (MIRALAX / GLYCOLAX) packet Take 17 g by mouth daily as needed for mild constipation.      . potassium chloride SA (K-DUR,KLOR-CON) 20 MEQ tablet Take 20 mEq by mouth 2 (two) times daily.      . rifampin (RIFADIN) 300 MG capsule Take 300 mg by mouth daily.      Marland Kitchen rOPINIRole (REQUIP) 0.25 MG tablet Take 0.25 mg by mouth  at bedtime.       . sevelamer carbonate (RENVELA) 800 MG tablet Take 2,400 mg by mouth 3 (three) times daily with meals.       Labs: Basic Metabolic Panel:  Recent Labs Lab 06/28/13 1315  NA 133*  K 4.4  CL 89*  CO2 23  GLUCOSE 145*  BUN 28*  CREATININE 5.84*  CALCIUM 9.5  PHOS 5.0*   Liver Function Tests:  Recent Labs Lab 06/28/13 1315  AST 17  ALT 10  ALKPHOS 103  BILITOT 0.9  PROT 6.5  ALBUMIN 2.2*   No results found for this basename: LIPASE, AMYLASE,  in the last 168 hours No results found for this basename: AMMONIA,  in the last 168 hours CBC:  Recent Labs Lab 06/28/13 1315  WBC 14.5*  NEUTROABS 10.5*  HGB 12.3  HCT 37.3  MCV 88.0  PLT 227   Cardiac Enzymes:  Recent Labs Lab 06/28/13 1315  TROPONINI <0.30   CBG: No results found for this basename: GLUCAP,  in the last 168 hours Iron Studies: No results found for this basename: IRON, TIBC, TRANSFERRIN, FERRITIN,  in the last 72 hours Studies/Results: Dg Chest Port 1 View  06/28/2013   CLINICAL DATA:  Weakness and hypotension  EXAM: PORTABLE CHEST - 1 VIEW  COMPARISON:  DG CHEST 1V PORT dated 01/05/2013  FINDINGS: The lungs are adequately inflated. There is no focal infiltrate. The cardiopericardial silhouette is normal in size. The pulmonary vascularity is not engorged. The mediastinum is normal in width. There is no pleural effusion. The observed portions of the bony thorax appear normal.  IMPRESSION: There is no evidence of CHF nor other active cardiopulmonary disease.   Electronically Signed   By: David  Martinique   On: 06/28/2013 14:11     Review of Systems: Negative except for  sharp abdominal pain at the site of PD cath. Reports chronic nausea and poor appetite, on megace  Physical Exam: Filed Vitals:   06/28/13 1345 06/28/13 1400 06/28/13 1434 06/28/13 1530  BP: 89/49 90/44 88/61  108/60  Pulse: 25     Temp:      TempSrc:      Resp: 14 14 20 12   SpO2: 75%  100% 100%     General: Well developed, well nourished, in no acute distress. Head: Normocephalic, atraumatic, sclera non-icteric, mucus membranes are moist Neck: Supple. JVD not elevated. Lungs: Clear bilaterally to auscultation without wheezes, rales, or rhonchi. Breathing is unlabored. Heart: RRR with S1 S2. No murmurs, rubs, or gallops appreciated. Abdomen: Soft, mild tenderness at LLQ PD cath site. Site clean, no signs of infection, no drainage. non-distended with normoactive bowel sounds. No rebound/guarding. No obvious abdominal masses. M-S:  Strength and tone appear normal for age. Lower extremities:without edema or ischemic changes, no open wounds  Neuro: Alert and oriented X 3. Moves all extremities spontaneously. Psych:  Responds to questions appropriately with a normal affect. Dialysis Access: LLQ PD cath   Dialysis Orders: PD   Assessment/Plan: 1.  Abdominal pain- recurrent peritonitis thought due to biofilm- giving rifampin- just completed OP  Antibiotics for peritonitis, abdomen appears benign.  Vanc and levaquin given per EDP. Afebrile. WBC 14.5. Blood and peritoneal cultures pending.  2. ESRD-  PD but needs to transition to HD given recurrent peritonitis and exrutiating abdominal pain, will have IR place perm cath and will need PD cath removed (surgery not called due to time when patient being admitted ) . She will also need to be clipped  to [facility] 3.  HyPOtension/volume  - 108/60, bolus given in the ED. I think is dry as opposed to septic ? 4.  Anemia  - hgb 12.3, hold ESA, watch CBC. On 10,000u epo sq weekly and venofer 200mg  q4 weeks- labs from 5/1 tsat 44 5.  Metabolic bone disease -   Ca+9.5, Phos 5. Pth 176 6.  Nutrition - Alb 2.2, megace at home- renal diet/supplements and multivt when admitted 7. afib- amiodarone  Shelle Iron, NP [facility] [phone] 06/28/2013, 4:20 PM   Patient seen and examined, agree with above note with above modifications.  Corliss Parish, MD 06/28/2013

## 2013-06-28 NOTE — Progress Notes (Signed)
Unit CM UR Completed by [facility] ED CM  W. Achaia Garlock RN  

## 2013-06-28 NOTE — ED Notes (Addendum)
Sent from [facility] has irreg heart rate and low bp was to get iron infusion

## 2013-06-28 NOTE — ED Provider Notes (Signed)
CSN: [id number]     Arrival date & time 06/28/13  1221 History   First MD Initiated Contact with Patient 06/28/13 1259     Chief Complaint  Patient presents with  . Abnormal Lab       Patient is a 80 year old female with past medical history relevant for end-stage renal disease currently on peritoneal dialysis, prior bouts of peritonitis, atrial fibrillation, heart failure, hyperlipidemia, and hypertension who presents with complaints of abdominal pain. Patient states she's been having gradually worsening abdominal pain at site of peritoneal catheter. Pain is currently moderate in severity and sharp in quality. She denies any trauma or inciting event. Other symptoms include one-week history of nausea and 8-10 bouts of nonbloody nonbilious emesis over the past week. Other symptoms such as fever, chest pain, shortness of breath, rash, or headache are denied by the patient and patient's main caregiver her daughter-in-law. Patient has recently finished peritoneal vancomycin and Fortaz. 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     (Consider location/radiation/quality/duration/timing/severity/associated sxs/prior Treatment) Patient is a 80 y.o. female presenting with general illness. The history is provided by the patient, a relative and medical records. No language interpreter was used.  Illness Severity:  Moderate Onset quality:  Gradual Timing:  Constant Progression:  Worsening Chronicity:  Recurrent   Past Medical History  Diagnosis Date  . Hypertension   . Diabetes mellitus   . Renal failure   . Mitral valve prolapse   . Hyperlipidemia   . CHF (congestive heart failure)   . Atrial fibrillation   . Arthritis   . Cancer 07/02/11    Left face   Past Surgical History  Procedure Laterality Date  . Nephrectomy      right kidney  . Abdominal hysterectomy    . Appendectomy    . Tonsillectomy    .  Av fistula placement  2010    left brachiocephalic AVF  . Peritoneal catheter insertion  2011   No family history on file. History  Substance Use Topics  . Smoking status: Never Smoker   . Smokeless tobacco: Not on file  . Alcohol Use: No   OB History   Grav Para Term Preterm Abortions TAB SAB Ect Mult Living                 Review of Systems  All other systems reviewed and are negative.     Allergies  Latex; Other; Penicillins; and Sulfa antibiotics  Home Medications   Prior to Admission medications   Medication Sig Start Date End Date Taking? Authorizing Provider  acetaminophen (TYLENOL) 500 MG tablet Take 1,000 mg by mouth daily as needed for fever.    Historical Provider, MD  amiodarone (PACERONE) 200 MG tablet Take 100 mg by mouth daily. Take 1 tablet once daily for 7 days, then take 1/2 tablet daily    Historical Provider, MD  aspirin 81 MG tablet Take 81 mg by mouth daily.    Historical Provider, MD  clopidogrel (PLAVIX) 75 MG tablet Take 75 mg by mouth daily.    Historical Provider, MD  diclofenac sodium (VOLTAREN) 1 % GEL Apply 2 g topically 4 (four) times daily.    Historical Provider, MD  docusate sodium (COLACE) 100 MG capsule Take 100 mg by mouth 2 (two) times daily.    Historical Provider, MD  doxycycline (VIBRAMYCIN) 100 MG capsule Take 1 capsule (100 mg total) by mouth daily. 01/09/13  Corky Sox, MD  gabapentin (NEURONTIN) 100 MG capsule Take 100 mg by mouth 2 (two) times daily.    Historical Provider, MD  lactulose (CHRONULAC) 10 GM/15ML solution Take 10 g by mouth every 2 (two) hours as needed. Until diarrhea    Historical Provider, MD  levofloxacin (LEVAQUIN) 250 MG tablet Take 1 tablet (250 mg total) by mouth daily. 01/09/13   Corky Sox, MD  levothyroxine (SYNTHROID, LEVOTHROID) 50 MCG tablet Take 50 mcg by mouth daily.     Historical Provider, MD  lidocaine (LIDODERM) 5 % Place 1 patch onto the skin daily. Remove & Discard patch within 12 hours or  as directed by MD    Historical Provider, MD  lovastatin (MEVACOR) 20 MG tablet Take 20 mg by mouth at bedtime.     Historical Provider, MD  mirtazapine (REMERON) 15 MG tablet Take 15 mg by mouth at bedtime.    Historical Provider, MD  omeprazole (PRILOSEC) 20 MG capsule Take 20 mg by mouth 2 (two) times daily before a meal.    Historical Provider, MD  rOPINIRole (REQUIP) 0.25 MG tablet Take 0.25 mg by mouth daily.    Historical Provider, MD  sevelamer carbonate (RENVELA) 800 MG tablet Take 2,400 mg by mouth 3 (three) times daily with meals.    Historical Provider, MD   BP 88/57  Pulse 108  Temp(Src) 97.4 F (36.3 C) (Oral)  Resp 15  SpO2 100% Physical Exam  Nursing note and vitals reviewed. Constitutional: She is oriented to person, place, and time. She appears well-developed and well-nourished.  HENT:  Head: Normocephalic and atraumatic.  Right Ear: External ear normal.  Left Ear: External ear normal.  Nose: Nose normal.  Mouth/Throat: Oropharynx is clear and moist.  Eyes: Conjunctivae and EOM are normal. Pupils are equal, round, and reactive to light.  Neck: Normal range of motion. Neck supple.  Cardiovascular: Regular rhythm.  Tachycardia present.   Pulmonary/Chest: Effort normal and breath sounds normal. No respiratory distress. She has no wheezes.  Abdominal: Soft. Bowel sounds are normal. She exhibits no distension and no mass. There is tenderness (mild TTP over catheter site and LLQ). There is no rebound and no guarding.  Musculoskeletal: Normal range of motion. She exhibits no edema and no tenderness.  Neurological: She is alert and oriented to person, place, and time. She has normal reflexes. No cranial nerve deficit.  Skin: Skin is warm and dry.  Psychiatric: She has a normal mood and affect.    ED Course  Procedures (including critical care time) Labs Review Labs Reviewed  CBC WITH DIFFERENTIAL - Abnormal; Notable for the following:    WBC 14.5 (*)    RDW 17.9 (*)     Neutro Abs 10.5 (*)    Monocytes Absolute 1.6 (*)    All other components within normal limits  COMPREHENSIVE METABOLIC PANEL - Abnormal; Notable for the following:    Sodium 133 (*)    Chloride 89 (*)    Glucose, Bld 145 (*)    BUN 28 (*)    Creatinine, Ser 5.84 (*)    Albumin 2.2 (*)    GFR calc non Af Amer 6 (*)    GFR calc Af Amer 7 (*)    All other components within normal limits  PHOSPHORUS - Abnormal; Notable for the following:    Phosphorus 5.0 (*)    All other components within normal limits  LACTIC ACID, PLASMA - Abnormal; Notable for the following:    Lactic  Acid, Venous 4.8 (*)    All other components within normal limits  BODY FLUID CELL COUNT WITH DIFFERENTIAL - Abnormal; Notable for the following:    Color, Fluid STRAW (*)    All other components within normal limits  GRAM STAIN  CULTURE, BLOOD (ROUTINE X 2)  CULTURE, BLOOD (ROUTINE X 2)  BODY FLUID CULTURE  MRSA PCR SCREENING  MAGNESIUM  TROPONIN I  PROTEIN, BODY FLUID  GLUCOSE, PERITONEAL FLUID  CBC  URINALYSIS, ROUTINE W REFLEX MICROSCOPIC    Imaging Review Dg Chest Port 1 View  06/28/2013   CLINICAL DATA:  Weakness and hypotension  EXAM: PORTABLE CHEST - 1 VIEW  COMPARISON:  DG CHEST 1V PORT dated 01/05/2013  FINDINGS: The lungs are adequately inflated. There is no focal infiltrate. The cardiopericardial silhouette is normal in size. The pulmonary vascularity is not engorged. The mediastinum is normal in width. There is no pleural effusion. The observed portions of the bony thorax appear normal.  IMPRESSION: There is no evidence of CHF nor other active cardiopulmonary disease.   Electronically Signed   By: David  Martinique   On: 06/28/2013 14:11     EKG Interpretation None       Date: 06/28/2013  Rate: 102  Rhythm: sinus tachycardia  QRS Axis: normal  Intervals: normal  ST/T Wave abnormalities: nonspecific ST changes  Conduction Disutrbances: occasional PVCs  Narrative Interpretation:   Old EKG  Reviewed: other than sinus tachycardia no significant change    MDM   Final diagnoses:  Tachycardia  Hypotension  Abdominal pain    Patient presents with pain at IP catheter site, N/V, and documented tachycardia/hypotension at clinic.  Here patient was noted to be Af and VS remarkable for HR in the 110s and initial BPs of 80s/60s.  PE as above and remarkable for TTP over LLQ and site of IP catheter.  There was concern for possible peritonitis given hx and current symptoms.  Blood cultures, CXR, and peritoneal cultures were sent to evaluate for sources of infection (patient does not make urine).  She was given vanc/levaquin for abx coverage.  Review of labs shows patient has leukocytosis and lactic acidosis.  Peritoneal samples show  WBC of 8.  Nephrology was aware of patient and it was recommended that patient be admitted for IP cath removal and initiation of HD.  Hospitalist consulted and patient admitted without acute events.      Corlis Leak, MD 06/29/13 1536

## 2013-06-28 NOTE — ED Notes (Signed)
Admitting MD at the bedside.  

## 2013-06-28 NOTE — ED Notes (Addendum)
Lynelle Smoke, DIL, home [phone] and cell [phone]. Call with room. Also please call prior to having hemodialysis catheter inserted.

## 2013-06-28 NOTE — H&P (Signed)
[facility] History and Physical  Valerie Yoder NWG:[id number] DOB: 13-Aug-1930 DOA: 06/28/2013  Referring physician: ED physician PCP: Laverna Peace, NP   Chief Complaint:   HPI:  80 year old female with end-stage renal disease currently on peritoneal dialysis, prior bouts of peritonitis, atrial fibrillation, heart failure, hyperlipidemia, and hypertension who presented to [facility] ED with main concern of one week duration of abdominal pain at the site of peritoneal cath. Pain is fairly constant in nature, 5/10 in severity, sharp, non radiating, no specific alleviating or aggravating factors. This has been associated with nausea and 8-10 episodes of non bloody vomiting. Pt denies chest pain or shortness of breath, no fevers, chills. Pt has recently completed treatment with Vancomycin and Fortaz. 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   In ED, pt noted to be hypotensive with SBP in 80's. Other vitals stable, T 97.4 F, HR 80 -90 bpm, oxygen saturation 100%. Nephrology consulted. TRH asked to admit to SDU for further evaluation and management.   Assessment and Plan:  Active Problems: Abdominal pain - secondary to recurrent peritonitis  - Vancomycin and Levaquin given in ED and will continue until blood and peritoneal cultures are back - continue analgesia as needed ESRD - per nephrology team, we appreciate assistance  - plan to transition to HD given recurrent peritonitis and persistent abdominal pain  Hypotension - monitor closely in SDU, no need for pressors at this time  Moderate malnutrition  - secondary to progressive illness and chronic failure to thrive  Leukocytosis  - secondary to peritonitis - ABX as noted above - repeat CBC in AM Anemia of chronic disease - hold ESA, CBC in AM A-fib - continue home regimen with amiodarone   Radiological Exams on Admission: Dg Chest Port 1 View    06/28/2013   There is no evidence of CHF nor other active cardiopulmonary disease.    Code Status: Full Family Communication: No family at bedside  Disposition Plan: Admit for further evaluation     Review of Systems:  Constitutional: Negative for fever, chills. Negative for diaphoresis.  HENT: Negative for hearing loss, sore throat, neck pain, tinnitus and ear discharge.   Eyes: Negative for blurred vision, double vision, photophobia, pain, discharge and redness.  Respiratory: Negative for cough, hemoptysis, wheezing and stridor.   Cardiovascular: Negative for chest pain, claudication and leg swelling.  Gastrointestinal: Negative for heartburn, constipation, blood in stool and melena.  Genitourinary: Negative for dysuria, urgency, frequency, hematuria and flank pain.  Musculoskeletal: Negative for myalgias, back pain, joint pain and falls.  Skin: Negative for itching and rash.  Neurological: Negative for loss of consciousness and headaches.  Endo/Heme/Allergies: Negative for environmental allergies and polydipsia. Does not bruise/bleed easily.  Psychiatric/Behavioral: Negative for suicidal ideas.     Past Medical History  Diagnosis Date  . Hypertension   . Diabetes mellitus   . Renal failure   . Mitral valve prolapse   . Hyperlipidemia   . CHF (congestive heart failure)   . Atrial fibrillation   . Arthritis   . Cancer 07/02/11    Left face    Past Surgical History  Procedure Laterality Date  . Nephrectomy      right kidney  . Abdominal hysterectomy    . Appendectomy    . Tonsillectomy    . Av fistula placement  2010    left brachiocephalic AVF  . Peritoneal catheter insertion  2011    Social  History:  reports that she has never smoked. She does not have any smokeless tobacco history on file. She reports that she does not drink alcohol or use illicit drugs.  Allergies  Allergen Reactions  . Latex   . Other     Pneumonia shot   . Penicillins   . Sulfa Antibiotics      No significant past family medical history.   Medication Sig  acetaminophen (TYLENOL) 500 MG tablet Take 1,000 mg daily as needed for fever.  amiodarone (PACERONE) 200 MG tablet Take 100 mg by mouth daily.   aspirin 81 MG chewable tablet Chew 81 mg by mouth daily.  clopidogrel (PLAVIX) 75 MG tablet Take 75 mg by mouth daily.  diclofenac sodium (VOLTAREN) 1 % GEL Apply 2 g topically 4 (four) times daily.  docusate sodium (COLACE) 100 MG capsule Take 100 mg by mouth 2 (two) times daily.  epoetin alfa 10000 UNIT/ML injection Inject 10,000 Units into the skin once a week.  gabapentin (NEURONTIN) 100 MG capsule Take 100-200 mg at bedtime as needed  HYDROcodone-acetaminophen  Take 1 tablet every 6 (six) hours as needed  levothyroxine 75 MCG tablet Take 75 mcg by mouth daily before breakfast.  lovastatin (MEVACOR) 20 MG tablet Take 20 mg by mouth at bedtime.   megestrol (MEGACE) 40 MG/ML suspension Take 400 mg by mouth daily.  mirtazapine (REMERON) 15 MG tablet Take 15 mg by mouth at bedtime.  omeprazole (PRILOSEC) 20 MG capsule Take 20 mg  2 (two) times daily before a meal.  polyethylene glycol  packet Take 17 g  daily as needed for constipation.  potassium chloride SA  20 MEQ tablet Take 20 mEq by mouth 2 (two) times daily.  rifampin (RIFADIN) 300 MG capsule Take 300 mg by mouth daily.  rOPINIRole (REQUIP) 0.25 MG tablet Take 0.25 mg by mouth at bedtime.   sevelamer carbonate 800 MG tablet Take 2,400 mg 3 (three) times daily with meals.    Physical Exam: Filed Vitals:   06/28/13 1345 06/28/13 1400 06/28/13 1434 06/28/13 1530  BP: 89/49 90/44 88/61  108/60  Pulse: 25     Temp:      TempSrc:      Resp: 14 14 20 12   SpO2: 75%  100% 100%    Physical Exam  Constitutional: Appears well-developed and well-nourished. No distress.  HENT: Normocephalic. External right and left ear normal. Oropharynx is clear and moist.  Eyes: Conjunctivae and EOM are normal. PERRLA, no scleral icterus.   Neck: Normal ROM. Neck supple. No JVD. No tracheal deviation. No thyromegaly.  CVS: RRR, S1/S2 +, no murmurs, no gallops, no carotid bruit.  Pulmonary: Effort and breath sounds normal, no stridor, rhonchi, wheezes, rales.  Abdominal: Soft. BS +,  no distension, mild tenderness at LLQ PD cath site.  Musculoskeletal: Normal range of motion. No edema and no tenderness.  Lymphadenopathy: No lymphadenopathy noted, cervical, inguinal. Neuro: Alert. Normal reflexes, muscle tone coordination. No cranial nerve deficit. Skin: Skin is warm and dry. No rash noted. Not diaphoretic. No erythema. No pallor.  Psychiatric: Normal mood and affect. Behavior, judgment, thought content normal.   Labs on Admission:  Basic Metabolic Panel:  Recent Labs Lab 06/28/13 1315  NA 133*  K 4.4  CL 89*  CO2 23  GLUCOSE 145*  BUN 28*  CREATININE 5.84*  CALCIUM 9.5  MG 1.5  PHOS 5.0*   Liver Function Tests:  Recent Labs Lab 06/28/13 1315  AST 17  ALT 10  ALKPHOS 103  BILITOT 0.9  PROT 6.5  ALBUMIN 2.2*   CBC:  Recent Labs Lab 06/28/13 1315  WBC 14.5*  NEUTROABS 10.5*  HGB 12.3  HCT 37.3  MCV 88.0  PLT 227   Cardiac Enzymes:  Recent Labs Lab 06/28/13 1315  TROPONINI <0.30   EKG: Normal sinus rhythm, no ST/T wave changes  Theodis Blaze, MD  [facility] Pager [phone]  If 7PM-7AM, please contact night-coverage [email or web address] Password [facility] 06/28/2013, 5:05 PM

## 2013-06-28 NOTE — Progress Notes (Signed)
ANTIBIOTIC CONSULT NOTE - INITIAL  Pharmacy Consult for Vancomycin and Levofloxacin Indication: Empiric abdominal infection (PD catheter in place)  Allergies  Allergen Reactions  . Latex   . Other     Pneumonia shot   . Penicillins   . Sulfa Antibiotics     Patient Measurements:    Vital Signs: Temp: 97.4 F (36.3 C) (05/14 1256) Temp src: Oral (05/14 1256) BP: 92/50 mmHg (05/14 1700) Pulse Rate: 97 (05/14 1700) Intake/Output from previous day:   Intake/Output from this shift: Total I/O In: 1300 [I.V.:1300] Out: -   Labs:  Recent Labs  06/28/13 1315  WBC 14.5*  HGB 12.3  PLT 227  CREATININE 5.84*   The CrCl is unknown because both a height and weight (above a minimum accepted value) are required for this calculation. No results found for this basename: VANCOTROUGH, VANCOPEAK, VANCORANDOM, [place], [place], [place], [place], TOBRAPEAK, TOBRARND, AMIKACINPEAK, AMIKACINTROU, AMIKACIN,  in the last 72 hours   Microbiology: Recent Results (from the past 720 hour(s))  GRAM STAIN     Status: None   Collection Time    06/28/13  1:48 PM      Result Value Ref Range Status   Specimen Description FLUID PERITONEAL DIALYSATE   Final   Special Requests Normal   Final   Gram Stain     Final   Value: CYTOSPIN PREP     WBC PRESENT,BOTH PMN AND MONONUCLEAR     NO ORGANISMS SEEN   Report Status 06/28/2013 FINAL   Final    Medical History: Past Medical History  Diagnosis Date  . Hypertension   . Diabetes mellitus   . Renal failure   . Mitral valve prolapse   . Hyperlipidemia   . CHF (congestive heart failure)   . Atrial fibrillation   . Arthritis   . Cancer 07/02/11    Left face    Medications:   (Not in a hospital admission) Assessment: 79 yo PD patients admitted 06/28/2013 with 1 week of pain around peritoneal catheter.  Pharmacy consulted to dose vancomycin and levofloxacin.  PTA: afib, HF, hyperlipidemia, HTN, Peritonitis: recent treatment with  vanc and fortaz, on rifampin PTA.  ID: empiric peritonitis 5/14 Vanc >> 5/14 Levofloxacin >>  Renal, PD pta, to transition to HD, follow up HD plan  Goal of Therapy:  Vancomycin trough level 10-15 mcg/ml; Renal adjustment of medications  Plan:  1. Levofloxacin 500 mg IV q 48h 2. Follow up HD schedule for further vancomycin dosing.  Thank you for allowing pharmacy to be a part of this patients care team.  Rowe Robert Pharm.D., BCPS, AQ-Cardiology Clinical Pharmacist 06/28/2013 6:23 PM Pager: [phone] Phone: [phone]

## 2013-06-29 DIAGNOSIS — I2510 Atherosclerotic heart disease of native coronary artery without angina pectoris: Secondary | ICD-10-CM | POA: Diagnosis present

## 2013-06-29 DIAGNOSIS — E43 Unspecified severe protein-calorie malnutrition: Secondary | ICD-10-CM | POA: Insufficient documentation

## 2013-06-29 DIAGNOSIS — I359 Nonrheumatic aortic valve disorder, unspecified: Secondary | ICD-10-CM

## 2013-06-29 DIAGNOSIS — E119 Type 2 diabetes mellitus without complications: Secondary | ICD-10-CM | POA: Diagnosis present

## 2013-06-29 DIAGNOSIS — I4891 Unspecified atrial fibrillation: Secondary | ICD-10-CM | POA: Diagnosis present

## 2013-06-29 DIAGNOSIS — E039 Hypothyroidism, unspecified: Secondary | ICD-10-CM | POA: Diagnosis present

## 2013-06-29 LAB — HEMOGLOBIN A1C
Hgb A1c MFr Bld: 6.5 % — ABNORMAL HIGH (ref <5.7)
Mean Plasma Glucose: 140 mg/dL — ABNORMAL HIGH (ref <117)

## 2013-06-29 LAB — PROTIME-INR
INR: 1.26 (ref 0.00–1.49)
Prothrombin Time: 15.5 seconds — ABNORMAL HIGH (ref 11.6–15.2)

## 2013-06-29 LAB — CBC
HCT: 28.6 % — ABNORMAL LOW (ref 36.0–46.0)
Hemoglobin: 9.6 g/dL — ABNORMAL LOW (ref 12.0–15.0)
MCH: 29.7 pg (ref 26.0–34.0)
MCHC: 33.6 g/dL (ref 30.0–36.0)
MCV: 88.5 fL (ref 78.0–100.0)
Platelets: 160 10*3/uL (ref 150–400)
RBC: 3.23 MIL/uL — AB (ref 3.87–5.11)
RDW: 17.9 % — AB (ref 11.5–15.5)
WBC: 11.4 10*3/uL — AB (ref 4.0–10.5)

## 2013-06-29 LAB — PATHOLOGIST SMEAR REVIEW

## 2013-06-29 LAB — TSH: TSH: 1.84 u[IU]/mL (ref 0.350–4.500)

## 2013-06-29 MED ORDER — PROMETHAZINE HCL 25 MG PO TABS: 25.0000 mg | ORAL_TABLET | Freq: Four times a day (QID) | ORAL | Status: DC | PRN

## 2013-06-29 MED ORDER — MIDODRINE HCL 5 MG PO TABS
10.0000 mg | ORAL_TABLET | Freq: Three times a day (TID) | ORAL | Status: DC
  Administered 2013-06-29 – 2013-07-03 (×12): 10 mg via ORAL
  Filled 2013-06-29 (×14): qty 2

## 2013-06-29 NOTE — Progress Notes (Signed)
[facility] ROUNDING NOTE   Subjective:   Interval History: blood pressures are low patient alert  Objective:  Vital signs in last 24 hours:  Temp:  [98 F (36.7 C)-98.4 F (36.9 C)] 98.2 F (36.8 C) (05/15 1200) Pulse Rate:  [74-127] 80 (05/15 1200) Resp:  [11-26] 18 (05/15 1200) BP: (53-118)/(13-61) 75/24 mmHg (05/15 1200) SpO2:  [98 %-100 %] 100 % (05/15 1200) Weight:  [49.6 kg (109 lb 5.6 oz)] 49.6 kg (109 lb 5.6 oz) (05/14 2135)  Weight change:  Filed Weights   06/28/13 2135  Weight: 49.6 kg (109 lb 5.6 oz)    Intake/Output: I/O last 3 completed shifts: In: 1300 [I.V.:1300] Out: 100 [Emesis/NG output:100]   Intake/Output this shift:     CVS- atrial fibrillation RS- CTA ABD- BS present soft non-distended PD cath exit site clean EXT- no edema   Basic Metabolic Panel:  Recent Labs Lab 06/28/13 1315  NA 133*  K 4.4  CL 89*  CO2 23  GLUCOSE 145*  BUN 28*  CREATININE 5.84*  CALCIUM 9.5  MG 1.5  PHOS 5.0*    Liver Function Tests:  Recent Labs Lab 06/28/13 1315  AST 17  ALT 10  ALKPHOS 103  BILITOT 0.9  PROT 6.5  ALBUMIN 2.2*   No results found for this basename: LIPASE, AMYLASE,  in the last 168 hours No results found for this basename: AMMONIA,  in the last 168 hours  CBC:  Recent Labs Lab 06/28/13 1315 06/29/13 0300  WBC 14.5* 11.4*  NEUTROABS 10.5*  --   HGB 12.3 9.6*  HCT 37.3 28.6*  MCV 88.0 88.5  PLT 227 160    Cardiac Enzymes:  Recent Labs Lab 06/28/13 1315  TROPONINI <0.30    BNP: No components found with this basename: POCBNP,   CBG: No results found for this basename: GLUCAP,  in the last 168 hours  Microbiology: Results for orders placed during the hospital encounter of 06/28/13  CULTURE, BLOOD (ROUTINE X 2)     Status: None   Collection Time    06/28/13  1:31 PM      Result Value Ref Range Status   Specimen Description BLOOD ARM RIGHT   Final   Special Requests BOTTLES DRAWN AEROBIC AND  ANAEROBIC 5CC   Final   Culture  Setup Time     Final   Value: 06/28/2013 18:52     Performed at [facility]   Culture     Final   Value:        BLOOD CULTURE RECEIVED NO GROWTH TO DATE CULTURE WILL BE HELD FOR 5 DAYS BEFORE ISSUING A FINAL NEGATIVE REPORT     Performed at [facility]   Report Status PENDING   Incomplete  GRAM STAIN     Status: None   Collection Time    06/28/13  1:48 PM      Result Value Ref Range Status   Specimen Description FLUID PERITONEAL DIALYSATE   Final   Special Requests Normal   Final   Gram Stain     Final   Value: CYTOSPIN PREP     WBC PRESENT,BOTH PMN AND MONONUCLEAR     NO ORGANISMS SEEN   Report Status 06/28/2013 FINAL   Final  BODY FLUID CULTURE     Status: None   Collection Time    06/28/13  1:48 PM      Result Value Ref Range Status   Specimen Description PERITONEAL DIALYSATE FLUID  Final   Special Requests Normal   Final   Gram Stain     Final   Value: NO WBC SEEN     NO ORGANISMS SEEN     Performed at [facility]   Culture     Final   Value: NO GROWTH 1 DAY     Performed at [facility]   Report Status PENDING   Incomplete  CULTURE, BLOOD (ROUTINE X 2)     Status: None   Collection Time    06/28/13  2:05 PM      Result Value Ref Range Status   Specimen Description BLOOD ARM RIGHT   Final   Special Requests BOTTLES DRAWN AEROBIC AND ANAEROBIC 5CC   Final   Culture  Setup Time     Final   Value: 06/28/2013 18:52     Performed at [facility]   Culture     Final   Value:        BLOOD CULTURE RECEIVED NO GROWTH TO DATE CULTURE WILL BE HELD FOR 5 DAYS BEFORE ISSUING A FINAL NEGATIVE REPORT     Performed at [facility]   Report Status PENDING   Incomplete  MRSA PCR SCREENING     Status: None   Collection Time    06/28/13  9:11 PM      Result Value Ref Range Status   MRSA by PCR NEGATIVE  NEGATIVE Final   Comment:            The GeneXpert MRSA Assay (FDA     approved for NASAL  specimens     only), is one component of a     comprehensive MRSA colonization     surveillance program. It is not     intended to diagnose MRSA     infection nor to guide or     monitor treatment for     MRSA infections.    Coagulation Studies: No results found for this basename: LABPROT, INR,  in the last 72 hours  Urinalysis: No results found for this basename: COLORURINE, APPERANCEUR, LABSPEC, PHURINE, GLUCOSEU, HGBUR, BILIRUBINUR, KETONESUR, PROTEINUR, UROBILINOGEN, NITRITE, LEUKOCYTESUR,  in the last 72 hours    Imaging: Dg Chest Port 1 View  06/28/2013   CLINICAL DATA:  Weakness and hypotension  EXAM: PORTABLE CHEST - 1 VIEW  COMPARISON:  DG CHEST 1V PORT dated 01/05/2013  FINDINGS: The lungs are adequately inflated. There is no focal infiltrate. The cardiopericardial silhouette is normal in size. The pulmonary vascularity is not engorged. The mediastinum is normal in width. There is no pleural effusion. The observed portions of the bony thorax appear normal.  IMPRESSION: There is no evidence of CHF nor other active cardiopulmonary disease.   Electronically Signed   By: David  Martinique   On: 06/28/2013 14:11     Medications:     . amiodarone  100 mg Oral Daily  . aspirin  81 mg Oral Daily  . clopidogrel  75 mg Oral Daily  . diclofenac sodium  2 g Topical QID  . docusate sodium  100 mg Oral BID  . [START ON 06/30/2013] levofloxacin (LEVAQUIN) IV  500 mg Intravenous Q48H  . levothyroxine  75 mcg Oral QAC breakfast  . megestrol  400 mg Oral Daily  . midodrine  10 mg Oral TID WC  . mirtazapine  15 mg Oral QHS  . pantoprazole  40 mg Oral Daily  . rifampin  300 mg Oral Daily  .  rOPINIRole  0.25 mg Oral QHS  . sevelamer carbonate  2,400 mg Oral TID WC  . simvastatin  20 mg Oral q1800  . sodium chloride  3 mL Intravenous Q12H   sodium chloride, acetaminophen, gabapentin, HYDROcodone-acetaminophen, ondansetron (ZOFRAN) IV, ondansetron, polyethylene glycol, promethazine, sodium  chloride  Assessment/ Plan:   ESRD- I doubt patient will tolerate hemodialysis no HD catheter  ANEMIA- Drop in Hb stop lovenox  HTN/VOL- controlled  Echo  Aortic regurgitation  ACCESS- PD catheter  OTHER- Recurrent peritonitis  Problematic but BP too low for HD  Add midodrine 10mg  TID    Continue levaquin and vancomycin  LOS: 1 Sherril Croon @TODAY @2 :47 PM

## 2013-06-29 NOTE — Progress Notes (Signed)
  Echocardiogram 2D Echocardiogram has been performed.  Carney Corners 06/29/2013, 2:30 PM

## 2013-06-29 NOTE — Progress Notes (Signed)
Patients SBP in 60's- 70's. Patient awakens without difficulty. Asymptomatic at this time. Dyanne Carrel NP for [employer] notified. No new orders at this time. Will continue to monitor.

## 2013-06-29 NOTE — Progress Notes (Signed)
INITIAL NUTRITION ASSESSMENT  Patient meets the criteria for moderate MALNUTRITION in the context of chronic illness with PO intake <75% of estimated needs and moderate to severe upper body muscle and subcutaneous fat tissue wasting.   DOCUMENTATION CODES Per approved criteria  -Non-severe (moderate) malnutrition in the context of chronic illness   INTERVENTION: - Will provide snacks BID per patient preference.  - Encourage adequate PO intake.  NUTRITION DIAGNOSIS: Inadequate oral intake related to nausea/vomiting as evidenced by pt report.   Goal: Patient will meet >/=90% of estimated nutrition needs  Monitor:  PO intake, weight, labs, I/Os  Reason for Assessment: Malnutrition screening tool  80 y.o. female  Admitting Dx: Peritonitis  ASSESSMENT: 80 year old female with history of end-stage renal disease on peritoneal dialysis, history of peritonitis, atrial fibrillation, heart failure, hyperlipidemia, and hypertension, admitted with peritonitis.   Patient reports abdominal pain, nausea, and 8-10 episodes of vomiting. She states that her appetite is usually fair, however, it has been consistent. Today she has been able to keep food down. She declines supplements at this time (also on a 1200 ml fluid restriction), but she does eat 2 snacks daily at home. No changes in weight reported. She is on megace.   Plan was to transition to HD given recurrent peritonitis, however, blood pressure too low for HD per nephrology.   Nutrition Focused Physical Exam:  Subcutaneous Fat:  Orbital Region: moderate wasting Upper Arm Region: severe wasting Thoracic and Lumbar Region: severe wasting  Muscle:  Temple Region: moderate wasting Clavicle Bone Region: moderate wasting Clavicle and Acromion Bone Region: moderate wasting Scapular Bone Region: severe wasting Dorsal Hand: severe wasting  Patellar Region: Not measured Anterior Thigh Region: Not measured Posterior Calf Region: Not  measured  Edema: not present  Height: Ht Readings from Last 1 Encounters:  06/28/13 5\' 2"  (1.575 m)    Weight: Wt Readings from Last 1 Encounters:  06/28/13 109 lb 5.6 oz (49.6 kg)    Ideal Body Weight: 110 pounds  % Ideal Body Weight: 99%  Wt Readings from Last 10 Encounters:  06/28/13 109 lb 5.6 oz (49.6 kg)  01/09/13 121 lb 7.6 oz (55.1 kg)  10/12/12 113 lb (51.256 kg)  07/15/11 111 lb 3.2 oz (50.44 kg)    Usual Body Weight: 110 pounds  % Usual Body Weight: 99%  BMI:  Body mass index is 19.99 kg/(m^2). Patient is normal weight.   Estimated Nutritional Needs: Kcal: 1200-1300 kcal Protein: 65-75 g Fluid: Per MD  Skin: Intact  Diet Order: Renal, 1200 ml fluid restriction  EDUCATION NEEDS: -No education needs identified at this time   Intake/Output Summary (Last 24 hours) at 06/29/13 1604 Last data filed at 06/29/13 0900  Gross per 24 hour  Intake    650 ml  Output    100 ml  Net    550 ml    Last BM: PTA   Labs:   Recent Labs Lab 06/28/13 1315  NA 133*  K 4.4  CL 89*  CO2 23  BUN 28*  CREATININE 5.84*  CALCIUM 9.5  MG 1.5  PHOS 5.0*  GLUCOSE 145*    CBG (last 3)  No results found for this basename: GLUCAP,  in the last 72 hours  Scheduled Meds: . amiodarone  100 mg Oral Daily  . aspirin  81 mg Oral Daily  . clopidogrel  75 mg Oral Daily  . diclofenac sodium  2 g Topical QID  . docusate sodium  100 mg Oral  BID  . [START ON 06/30/2013] levofloxacin (LEVAQUIN) IV  500 mg Intravenous Q48H  . levothyroxine  75 mcg Oral QAC breakfast  . megestrol  400 mg Oral Daily  . midodrine  10 mg Oral TID WC  . mirtazapine  15 mg Oral QHS  . pantoprazole  40 mg Oral Daily  . rifampin  300 mg Oral Daily  . rOPINIRole  0.25 mg Oral QHS  . sevelamer carbonate  2,400 mg Oral TID WC  . simvastatin  20 mg Oral q1800  . sodium chloride  3 mL Intravenous Q12H    Continuous Infusions:   Past Medical History  Diagnosis Date  . Hypertension   .  Diabetes mellitus   . Renal failure   . Mitral valve prolapse   . Hyperlipidemia   . CHF (congestive heart failure)   . Atrial fibrillation   . Arthritis   . Cancer 07/02/11    Left face    Past Surgical History  Procedure Laterality Date  . Nephrectomy      right kidney  . Abdominal hysterectomy    . Appendectomy    . Tonsillectomy    . Av fistula placement  2010    left brachiocephalic AVF  . Peritoneal catheter insertion  2011    Larey Seat, RD, LDN Pager #: [phone] After-Hours Pager #: [phone]

## 2013-06-29 NOTE — Progress Notes (Signed)
[facility]  Valerie Yoder JWJ:[id number] DOB: 07-02-1930 DOA: 06/28/2013 PCP: Laverna Peace, NP  Time spent : 35 mins  Brief narrative: 80 year old female with end-stage renal disease currently on peritoneal dialysis, prior bouts of peritonitis, atrial fibrillation, heart failure, hyperlipidemia, and hypertension who presented to [facility] ED with main concern of one week duration of abdominal pain at the site of peritoneal cath. Pain was fairly constant in nature, 5/10 in severity, sharp, non radiating, no specific alleviating or aggravating factors. This had been associated with nausea and 8-10 episodes of non bloody vomiting. Pt denied chest pain or shortness of breath, no fevers, chills. Pt had recently completed treatment with Vancomycin and Fortaz. Currently on rifampin for what home health nurse and daughter-in-law describe as bacillus from her peritoneal catheter.   In ED, pt noted to be hypotensive with SBP in 80's. Other vitals stable, T 97.4 F, HR 80 -90 bpm, oxygen saturation 100%. Nephrology consulted.   HPI/Subjective: Endorsed mild diffuse abdominal pain. Unclear as to baseline BP readings although said can vary daily.  Assessment/Plan:    End stage renal disease/Peritonitis -on PD pre admit -recurrent peritonitis so plan is to transition to HD this admit -placement of temporary HD cath and removal existing PD at discretion of Nephrology -cont anbx's -FU on cx's    Hypotension -seems to have a chronic component -2014 admit DC summary documented BP can run between 70 and 90 -suspect to tolerate HD may require Midodrine    Normocytic anemia -due to CKD -cont Epogen    Diastolic dysfunction-grade 1  -compensated -management per HD    Coronary artery disease -cont Plavix and baby ASA -has inferolateral TW inversion NEW from 2014 EKG -ck ECHO snice last done 2014 -may need Cards eval    Atrial fibrillation -rate controlled and  maintaining NSR -cont Pacerone    Hypothyroidism -ck TSH -cont Synthroid    Diabetes mellitus -not on meds predamit -ck HgbA1c    Moderate malnutrition in the context of chronic illness   DVT prophylaxis: Lovenox Code Status: Full Family Communication: No family at bedside Disposition Plan/Expected LOS: Step down  Consultants: Nephrology  Procedures: 2-D echocardiogram pending  Antibiotics: Levaquin 5/14 >>> Rifampin 5/14 >>> Vancomycin 5/14 >>>  Objective: Blood pressure 75/24, pulse 80, temperature 98.2 F (36.8 C), temperature source Oral, resp. rate 18, height 5\' 2"  (1.575 m), weight 109 lb 5.6 oz (49.6 kg), SpO2 100.00%.  Intake/Output Summary (Last 24 hours) at 06/29/13 1237 Last data filed at 06/28/13 1709  Gross per 24 hour  Intake   1300 ml  Output    100 ml  Net   1200 ml   Exam: General: No acute respiratory distress Lungs: Clear to auscultation bilaterally without wheezes or crackles, RA Cardiovascular: Regular rate and rhythm without murmur gallop or rub normal S1 and S2, no peripheral edema or JVD-systolic blood pressure soft ranging anywhere from 55/23-114/44 and averaging around 62-13 systolic Abdomen: Mildly and diffusely tender guarding or rebound, some distended, soft, bowel sounds positive, no ascites, no appreciable mass Musculoskeletal: No significant cyanosis, clubbing of bilateral lower extremities   Scheduled Meds:  Scheduled Meds: . amiodarone  100 mg Oral Daily  . aspirin  81 mg Oral Daily  . clopidogrel  75 mg Oral Daily  . diclofenac sodium  2 g Topical QID  . docusate sodium  100 mg Oral BID  . enoxaparin (LOVENOX) injection  40 mg Subcutaneous Q24H  . [START  ON 06/30/2013] levofloxacin (LEVAQUIN) IV  500 mg Intravenous Q48H  . levothyroxine  75 mcg Oral QAC breakfast  . megestrol  400 mg Oral Daily  . mirtazapine  15 mg Oral QHS  . pantoprazole  40 mg Oral Daily  . rifampin  300 mg Oral Daily  . rOPINIRole  0.25 mg Oral QHS    . sevelamer carbonate  2,400 mg Oral TID WC  . simvastatin  20 mg Oral q1800  . sodium chloride  3 mL Intravenous Q12H   Data Reviewed: Basic Metabolic Panel:  Recent Labs Lab 06/28/13 1315  NA 133*  K 4.4  CL 89*  CO2 23  GLUCOSE 145*  BUN 28*  CREATININE 5.84*  CALCIUM 9.5  MG 1.5  PHOS 5.0*   Liver Function Tests:  Recent Labs Lab 06/28/13 1315  AST 17  ALT 10  ALKPHOS 103  BILITOT 0.9  PROT 6.5  ALBUMIN 2.2*   CBC:  Recent Labs Lab 06/28/13 1315 06/29/13 0300  WBC 14.5* 11.4*  NEUTROABS 10.5*  --   HGB 12.3 9.6*  HCT 37.3 28.6*  MCV 88.0 88.5  PLT 227 160   Cardiac Enzymes:  Recent Labs Lab 06/28/13 1315  TROPONINI <0.30    Recent Results (from the past 240 hour(s))  CULTURE, BLOOD (ROUTINE X 2)     Status: None   Collection Time    06/28/13  1:31 PM      Result Value Ref Range Status   Specimen Description BLOOD ARM RIGHT   Final   Special Requests BOTTLES DRAWN AEROBIC AND ANAEROBIC 5CC   Final   Culture  Setup Time     Final   Value: 06/28/2013 18:52     Performed at [facility]   Culture     Final   Value:        BLOOD CULTURE RECEIVED NO GROWTH TO DATE CULTURE WILL BE HELD FOR 5 DAYS BEFORE ISSUING A FINAL NEGATIVE REPORT     Performed at [facility]   Report Status PENDING   Incomplete  GRAM STAIN     Status: None   Collection Time    06/28/13  1:48 PM      Result Value Ref Range Status   Specimen Description FLUID PERITONEAL DIALYSATE   Final   Special Requests Normal   Final   Gram Stain     Final   Value: CYTOSPIN PREP     WBC PRESENT,BOTH PMN AND MONONUCLEAR     NO ORGANISMS SEEN   Report Status 06/28/2013 FINAL   Final  BODY FLUID CULTURE     Status: None   Collection Time    06/28/13  1:48 PM      Result Value Ref Range Status   Specimen Description PERITONEAL DIALYSATE FLUID   Final   Special Requests Normal   Final   Gram Stain     Final   Value: NO WBC SEEN     NO ORGANISMS SEEN      Performed at [facility]   Culture     Final   Value: NO GROWTH 1 DAY     Performed at [facility]   Report Status PENDING   Incomplete  CULTURE, BLOOD (ROUTINE X 2)     Status: None   Collection Time    06/28/13  2:05 PM      Result Value Ref Range Status   Specimen Description BLOOD ARM RIGHT   Final  Special Requests BOTTLES DRAWN AEROBIC AND ANAEROBIC 5CC   Final   Culture  Setup Time     Final   Value: 06/28/2013 18:52     Performed at [facility]   Culture     Final   Value:        BLOOD CULTURE RECEIVED NO GROWTH TO DATE CULTURE WILL BE HELD FOR 5 DAYS BEFORE ISSUING A FINAL NEGATIVE REPORT     Performed at [facility]   Report Status PENDING   Incomplete  MRSA PCR SCREENING     Status: None   Collection Time    06/28/13  9:11 PM      Result Value Ref Range Status   MRSA by PCR NEGATIVE  NEGATIVE Final   Comment:            The GeneXpert MRSA Assay (FDA     approved for NASAL specimens     only), is one component of a     comprehensive MRSA colonization     surveillance program. It is not     intended to diagnose MRSA     infection nor to guide or     monitor treatment for     MRSA infections.     Studies:  Recent x-ray studies have been reviewed in detail by the Attending Physician  Erin Hearing, ANP [facility] Office  [phone] Pager [phone]  **If unable to reach the above provider after paging please contact the [place] @ [phone]  On-Call/Text Page:      [email or web address]      password [email or web address]  If 7PM-7AM, please contact night-coverage [email or web address] Password [facility] 06/29/2013, 12:37 PM   LOS: 1 day   I have personally examined this patient and reviewed the entire database. I have reviewed the above note, made any necessary editorial changes, and agree with its content.  Cherene Altes, MD [facility]

## 2013-06-30 LAB — COMPREHENSIVE METABOLIC PANEL
ALT: 7 U/L (ref 0–35)
AST: 9 U/L (ref 0–37)
Albumin: 1.4 g/dL — ABNORMAL LOW (ref 3.5–5.2)
Alkaline Phosphatase: 90 U/L (ref 39–117)
BUN: 38 mg/dL — ABNORMAL HIGH (ref 6–23)
CHLORIDE: 91 meq/L — AB (ref 96–112)
CO2: 20 meq/L (ref 19–32)
CREATININE: 7.87 mg/dL — AB (ref 0.50–1.10)
Calcium: 8.2 mg/dL — ABNORMAL LOW (ref 8.4–10.5)
GFR, EST AFRICAN AMERICAN: 5 mL/min — AB (ref >90)
GFR, EST NON AFRICAN AMERICAN: 4 mL/min — AB (ref >90)
GLUCOSE: 94 mg/dL (ref 70–99)
Potassium: 3.8 mEq/L (ref 3.7–5.3)
Sodium: 131 mEq/L — ABNORMAL LOW (ref 137–147)
Total Bilirubin: 0.8 mg/dL (ref 0.3–1.2)
Total Protein: 4.2 g/dL — ABNORMAL LOW (ref 6.0–8.3)

## 2013-06-30 LAB — CBC
HCT: 26.2 % — ABNORMAL LOW (ref 36.0–46.0)
Hemoglobin: 8.6 g/dL — ABNORMAL LOW (ref 12.0–15.0)
MCH: 29.2 pg (ref 26.0–34.0)
MCHC: 32.8 g/dL (ref 30.0–36.0)
MCV: 88.8 fL (ref 78.0–100.0)
PLATELETS: 170 10*3/uL (ref 150–400)
RBC: 2.95 MIL/uL — AB (ref 3.87–5.11)
RDW: 17.9 % — ABNORMAL HIGH (ref 11.5–15.5)
WBC: 11 10*3/uL — ABNORMAL HIGH (ref 4.0–10.5)

## 2013-06-30 MED ORDER — HEPARIN SODIUM (PORCINE) 5000 UNIT/ML IJ SOLN
5000.0000 [IU] | Freq: Three times a day (TID) | INTRAMUSCULAR | Status: DC
  Administered 2013-06-30 – 2013-07-03 (×7): 5000 [IU] via SUBCUTANEOUS
  Filled 2013-06-30 (×11): qty 1

## 2013-06-30 MED ORDER — PROMETHAZINE HCL 25 MG PO TABS
12.5000 mg | ORAL_TABLET | Freq: Four times a day (QID) | ORAL | Status: DC | PRN
  Filled 2013-06-30: qty 1

## 2013-06-30 MED ORDER — ACETAMINOPHEN 325 MG PO TABS: 650.0000 mg | ORAL_TABLET | Freq: Four times a day (QID) | ORAL | Status: DC | PRN

## 2013-06-30 NOTE — Progress Notes (Signed)
[facility] ROUNDING NOTE   Subjective:   Interval History   Bathing and alert no shortness of breath  Objective:  Vital signs in last 24 hours:  Temp:  [98.2 F (36.8 C)-98.7 F (37.1 C)] 98.3 F (36.8 C) (05/16 0816) Pulse Rate:  [63-84] 81 (05/16 0800) Resp:  [13-19] 17 (05/16 0800) BP: (70-92)/(24-37) 84/34 mmHg (05/16 0800) SpO2:  [95 %-100 %] 99 % (05/16 0800)  Weight change:  Filed Weights   06/28/13 2135  Weight: 49.6 kg (109 lb 5.6 oz)    Intake/Output: I/O last 3 completed shifts: In: 29 [P.O.:590] Out: -    Intake/Output this shift:  Total I/O In: 120 [P.O.:120] Out: -   CVS-  Atrial fibrillation / sinus RS- CTA ABD- BS present soft non-distended  PD catheter exit site crust  EXT- no edema   Basic Metabolic Panel:  Recent Labs Lab 06/28/13 1315 06/30/13 0258  NA 133* 131*  K 4.4 3.8  CL 89* 91*  CO2 23 20  GLUCOSE 145* 94  BUN 28* 38*  CREATININE 5.84* 7.87*  CALCIUM 9.5 8.2*  MG 1.5  --   PHOS 5.0*  --     Liver Function Tests:  Recent Labs Lab 06/28/13 1315 06/30/13 0258  AST 17 9  ALT 10 7  ALKPHOS 103 90  BILITOT 0.9 0.8  PROT 6.5 4.2*  ALBUMIN 2.2* 1.4*   No results found for this basename: LIPASE, AMYLASE,  in the last 168 hours No results found for this basename: AMMONIA,  in the last 168 hours  CBC:  Recent Labs Lab 06/28/13 1315 06/29/13 0300 06/30/13 0258  WBC 14.5* 11.4* 11.0*  NEUTROABS 10.5*  --   --   HGB 12.3 9.6* 8.6*  HCT 37.3 28.6* 26.2*  MCV 88.0 88.5 88.8  PLT 227 160 170    Cardiac Enzymes:  Recent Labs Lab 06/28/13 1315  TROPONINI <0.30    BNP: No components found with this basename: POCBNP,   CBG: No results found for this basename: GLUCAP,  in the last 168 hours  Microbiology: Results for orders placed during the hospital encounter of 06/28/13  CULTURE, BLOOD (ROUTINE X 2)     Status: None   Collection Time    06/28/13  1:31 PM      Result Value Ref Range  Status   Specimen Description BLOOD ARM RIGHT   Final   Special Requests BOTTLES DRAWN AEROBIC AND ANAEROBIC 5CC   Final   Culture  Setup Time     Final   Value: 06/28/2013 18:52     Performed at [facility]   Culture     Final   Value:        BLOOD CULTURE RECEIVED NO GROWTH TO DATE CULTURE WILL BE HELD FOR 5 DAYS BEFORE ISSUING A FINAL NEGATIVE REPORT     Performed at [facility]   Report Status PENDING   Incomplete  GRAM STAIN     Status: None   Collection Time    06/28/13  1:48 PM      Result Value Ref Range Status   Specimen Description FLUID PERITONEAL DIALYSATE   Final   Special Requests Normal   Final   Gram Stain     Final   Value: CYTOSPIN PREP     WBC PRESENT,BOTH PMN AND MONONUCLEAR     NO ORGANISMS SEEN   Report Status 06/28/2013 FINAL   Final  BODY FLUID CULTURE  Status: None   Collection Time    06/28/13  1:48 PM      Result Value Ref Range Status   Specimen Description PERITONEAL DIALYSATE FLUID   Final   Special Requests Normal   Final   Gram Stain     Final   Value: NO WBC SEEN     NO ORGANISMS SEEN     Performed at [facility]   Culture     Final   Value: NO GROWTH 1 DAY     Performed at [facility]   Report Status PENDING   Incomplete  CULTURE, BLOOD (ROUTINE X 2)     Status: None   Collection Time    06/28/13  2:05 PM      Result Value Ref Range Status   Specimen Description BLOOD ARM RIGHT   Final   Special Requests BOTTLES DRAWN AEROBIC AND ANAEROBIC 5CC   Final   Culture  Setup Time     Final   Value: 06/28/2013 18:52     Performed at [facility]   Culture     Final   Value:        BLOOD CULTURE RECEIVED NO GROWTH TO DATE CULTURE WILL BE HELD FOR 5 DAYS BEFORE ISSUING A FINAL NEGATIVE REPORT     Performed at [facility]   Report Status PENDING   Incomplete  MRSA PCR SCREENING     Status: None   Collection Time    06/28/13  9:11 PM      Result Value Ref Range Status   MRSA by PCR  NEGATIVE  NEGATIVE Final   Comment:            The GeneXpert MRSA Assay (FDA     approved for NASAL specimens     only), is one component of a     comprehensive MRSA colonization     surveillance program. It is not     intended to diagnose MRSA     infection nor to guide or     monitor treatment for     MRSA infections.    Coagulation Studies:  Recent Labs  06/29/13 1345  LABPROT 15.5*  INR 1.26    Urinalysis: No results found for this basename: COLORURINE, APPERANCEUR, LABSPEC, PHURINE, GLUCOSEU, HGBUR, BILIRUBINUR, KETONESUR, PROTEINUR, UROBILINOGEN, NITRITE, LEUKOCYTESUR,  in the last 72 hours    Imaging: Dg Chest Port 1 View  06/28/2013   CLINICAL DATA:  Weakness and hypotension  EXAM: PORTABLE CHEST - 1 VIEW  COMPARISON:  DG CHEST 1V PORT dated 01/05/2013  FINDINGS: The lungs are adequately inflated. There is no focal infiltrate. The cardiopericardial silhouette is normal in size. The pulmonary vascularity is not engorged. The mediastinum is normal in width. There is no pleural effusion. The observed portions of the bony thorax appear normal.  IMPRESSION: There is no evidence of CHF nor other active cardiopulmonary disease.   Electronically Signed   By: David  Martinique   On: 06/28/2013 14:11     Medications:     . amiodarone  100 mg Oral Daily  . aspirin  81 mg Oral Daily  . clopidogrel  75 mg Oral Daily  . diclofenac sodium  2 g Topical QID  . docusate sodium  100 mg Oral BID  . levofloxacin (LEVAQUIN) IV  500 mg Intravenous Q48H  . levothyroxine  75 mcg Oral QAC breakfast  . megestrol  400 mg Oral Daily  . midodrine  10 mg  Oral TID WC  . mirtazapine  15 mg Oral QHS  . pantoprazole  40 mg Oral Daily  . rifampin  300 mg Oral Daily  . rOPINIRole  0.25 mg Oral QHS  . sevelamer carbonate  2,400 mg Oral TID WC  . simvastatin  20 mg Oral q1800  . sodium chloride  3 mL Intravenous Q12H   sodium chloride, acetaminophen, gabapentin, HYDROcodone-acetaminophen,  ondansetron (ZOFRAN) IV, ondansetron, polyethylene glycol, promethazine, sodium chloride  Assessment/ Plan:  ESRD- I doubt patient will tolerate hemodialysis no HD catheter  ANEMIA- Drop in Hb stop lovenox  HTN/VOL- controlled Echo Aortic regurgitation  ACCESS- PD catheter  OTHER- Recurrent peritonitis Problematic but BP too low for HD Add midodrine 10mg  TID     Blood pressure still very soft. Discussed with Dr Evelena Peat regarding feasibility of exchange of PD catheter. He will discuss with [facility]. There may be very limited options. Mrs Botelho understands that there are no good solutions and continuing PD with antibiotics may be only possibility    LOS: 2 Sherril Croon @TODAY @11 :21 AM

## 2013-06-30 NOTE — Progress Notes (Signed)
[facility]  Valerie Yoder TKZ:[id number] DOB: 1930-09-01 DOA: 06/28/2013 PCP: Laverna Peace, NP  Time spent : 35 mins  Brief narrative: 80 year old female with end-stage renal disease currently on peritoneal dialysis, prior bouts of peritonitis, atrial fibrillation, heart failure, hyperlipidemia, and hypertension who presented to [facility] ED with one week duration of abdominal pain at the site of her peritoneal cath. This had been associated with nausea and 8-10 episodes of non bloody vomiting. Pt denied chest pain or shortness of breath, no fevers, chills. 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   In ED, pt noted to be hypotensive with SBP in 80's. Other vitals stable, T 97.4 F, HR 80 -90 bpm, oxygen saturation 100%. Nephrology consulted.   HPI/Subjective: BP remains marginal.  Pt reports some modest pain in the L side of her abdom, as well as minimal nausea.  All sx are reportedly well controlled w/ medical tx.  Assessment/Plan:  Presumed recurring peritonitis associated w/ PD cath - ESRD PD dependent  See discussing per Nephrology - plan was to attempt to convert to HD, but low BP has precluded same - Neph discussion possible exchange of PD catheter w/ Gen Surg  Hypotension a chronic issue (2014 D/C summary documented SBP 70-90) - midodrine added per Nephrology  Normocytic anemia due to CKD - cont Epogen - may require transfusion if downward trend continues - no evidence of blood loss, but Nephrology has stopped lovenox as precaution  Chronic Diastolic CHF - grade 1  Well compensated at present - management per dialysis   Coronary artery disease cont Plavix and baby ASA - has inferolateral TW inversion NEW from 2014 EKG - TTE w/o focal WMA - would not presently be a candidate for anything other than  medical tx even if proven to have new lesions   Atrial fibrillation rate controlled and maintaining NSR - cont Pacerone - no BB or CCB due to hypotension   Hypothyroidism TSH in normal range, but not at goal of 1.0 - will not adjust dose in setting of afib - cont Synthroid  Diabetes mellitus HgbA1c 6.5 - follow CBG - was not on meds prior to admit   Moderate malnutrition in the context of chronic illness   DVT prophylaxis: SCDs Code Status: Full Family Communication: No family at bedside Disposition Plan:  SDU  Consultants: Nephrology  Procedures: TTE - 5/15 - EF 55-60% - no WMA - grade 1 DD - mild to mod Ao regurg - no pericardial effusion   Antibiotics: Levaquin 5/14 > Rifampin 5/14 > Vancomycin 5/14 >  Objective: Blood pressure 94/29, pulse 80, temperature 97.9 F (36.6 C), temperature source Oral, resp. rate 15, height 5\' 2"  (1.575 m), weight 49.6 kg (109 lb 5.6 oz), SpO2 98.00%.  Intake/Output Summary (Last 24 hours) at 06/30/13 1155 Last data filed at 06/30/13 0900  Gross per 24 hour  Intake    660 ml  Output      0 ml  Net    660 ml   Exam: General: No acute respiratory distress Lungs: Clear to auscultation bilaterally without wheezes or crackles Cardiovascular: Regular rate and rhythm without murmur gallop or rub normal S1 and S2 Abdomen: Mildly and diffusely tender without guarding or rebound, soft, bowel sounds positive, no ascites, no appreciable mass Musculoskeletal: No significant cyanosis, clubbing of bilateral lower extremities -  no signif edema B LE   Scheduled Meds:  Scheduled Meds: . amiodarone  100 mg Oral Daily  . aspirin  81 mg Oral Daily  . clopidogrel  75 mg Oral Daily  . diclofenac sodium  2 g Topical QID  . docusate sodium  100 mg Oral BID  . levofloxacin (LEVAQUIN) IV  500 mg Intravenous Q48H  . levothyroxine  75 mcg Oral QAC breakfast  . megestrol  400 mg Oral Daily  . midodrine  10 mg Oral TID WC  . mirtazapine  15 mg Oral QHS  .  pantoprazole  40 mg Oral Daily  . rifampin  300 mg Oral Daily  . rOPINIRole  0.25 mg Oral QHS  . sevelamer carbonate  2,400 mg Oral TID WC  . simvastatin  20 mg Oral q1800  . sodium chloride  3 mL Intravenous Q12H   Data Reviewed: Basic Metabolic Panel:  Recent Labs Lab 06/28/13 1315 06/30/13 0258  NA 133* 131*  K 4.4 3.8  CL 89* 91*  CO2 23 20  GLUCOSE 145* 94  BUN 28* 38*  CREATININE 5.84* 7.87*  CALCIUM 9.5 8.2*  MG 1.5  --   PHOS 5.0*  --    Liver Function Tests:  Recent Labs Lab 06/28/13 1315 06/30/13 0258  AST 17 9  ALT 10 7  ALKPHOS 103 90  BILITOT 0.9 0.8  PROT 6.5 4.2*  ALBUMIN 2.2* 1.4*   CBC:  Recent Labs Lab 06/28/13 1315 06/29/13 0300 06/30/13 0258  WBC 14.5* 11.4* 11.0*  NEUTROABS 10.5*  --   --   HGB 12.3 9.6* 8.6*  HCT 37.3 28.6* 26.2*  MCV 88.0 88.5 88.8  PLT 227 160 170   Cardiac Enzymes:  Recent Labs Lab 06/28/13 1315  TROPONINI <0.30    Recent Results (from the past 240 hour(s))  CULTURE, BLOOD (ROUTINE X 2)     Status: None   Collection Time    06/28/13  1:31 PM      Result Value Ref Range Status   Specimen Description BLOOD ARM RIGHT   Final   Special Requests BOTTLES DRAWN AEROBIC AND ANAEROBIC 5CC   Final   Culture  Setup Time     Final   Value: 06/28/2013 18:52     Performed at [facility]   Culture     Final   Value:        BLOOD CULTURE RECEIVED NO GROWTH TO DATE CULTURE WILL BE HELD FOR 5 DAYS BEFORE ISSUING A FINAL NEGATIVE REPORT     Performed at [facility]   Report Status PENDING   Incomplete  GRAM STAIN     Status: None   Collection Time    06/28/13  1:48 PM      Result Value Ref Range Status   Specimen Description FLUID PERITONEAL DIALYSATE   Final   Special Requests Normal   Final   Gram Stain     Final   Value: CYTOSPIN PREP     WBC PRESENT,BOTH PMN AND MONONUCLEAR     NO ORGANISMS SEEN   Report Status 06/28/2013 FINAL   Final  BODY FLUID CULTURE     Status: None   Collection  Time    06/28/13  1:48 PM      Result Value Ref Range Status   Specimen Description PERITONEAL DIALYSATE FLUID   Final   Special Requests Normal   Final   Gram Stain     Final   Value:  NO WBC SEEN     NO ORGANISMS SEEN     Performed at [facility]   Culture     Final   Value: NO GROWTH 1 DAY     Performed at [facility]   Report Status PENDING   Incomplete  CULTURE, BLOOD (ROUTINE X 2)     Status: None   Collection Time    06/28/13  2:05 PM      Result Value Ref Range Status   Specimen Description BLOOD ARM RIGHT   Final   Special Requests BOTTLES DRAWN AEROBIC AND ANAEROBIC 5CC   Final   Culture  Setup Time     Final   Value: 06/28/2013 18:52     Performed at [facility]   Culture     Final   Value:        BLOOD CULTURE RECEIVED NO GROWTH TO DATE CULTURE WILL BE HELD FOR 5 DAYS BEFORE ISSUING A FINAL NEGATIVE REPORT     Performed at [facility]   Report Status PENDING   Incomplete  MRSA PCR SCREENING     Status: None   Collection Time    06/28/13  9:11 PM      Result Value Ref Range Status   MRSA by PCR NEGATIVE  NEGATIVE Final   Comment:            The GeneXpert MRSA Assay (FDA     approved for NASAL specimens     only), is one component of a     comprehensive MRSA colonization     surveillance program. It is not     intended to diagnose MRSA     infection nor to guide or     monitor treatment for     MRSA infections.     Studies:  Recent x-ray studies have been reviewed in detail by the Attending Physician  Cherene Altes, MD [facility] For Consults/Admissions - Flow Manager - [phone] Office  [phone] Pager [phone]  On-Call/Text Page:      [email or web address]      password [facility]  06/30/2013, 11:55 AM   LOS: 2 days

## 2013-06-30 NOTE — Progress Notes (Signed)
Dr  Mercy Moore made aware of Pt's freq nausea and very poor ( food)  intake . this am's lab values also reviewed.. No  PD since 5/14 . Midodrine  10 mg ordered TID for low b/p's . SBP   = 80's to 90's  . Pt in A-fib ( chronic) . Pt remains anuric . Pt A & O x 4 w/very pleasant demeanor . Family (son and daughter-in-law) eagerly participate in care .

## 2013-07-01 LAB — RENAL FUNCTION PANEL
ALBUMIN: 1.5 g/dL — AB (ref 3.5–5.2)
BUN: 47 mg/dL — AB (ref 6–23)
CHLORIDE: 88 meq/L — AB (ref 96–112)
CO2: 18 mEq/L — ABNORMAL LOW (ref 19–32)
Calcium: 8.2 mg/dL — ABNORMAL LOW (ref 8.4–10.5)
Creatinine, Ser: 8.66 mg/dL — ABNORMAL HIGH (ref 0.50–1.10)
GFR calc Af Amer: 4 mL/min — ABNORMAL LOW (ref >90)
GFR, EST NON AFRICAN AMERICAN: 4 mL/min — AB (ref >90)
Glucose, Bld: 95 mg/dL (ref 70–99)
Phosphorus: 5.1 mg/dL — ABNORMAL HIGH (ref 2.3–4.6)
Potassium: 4.4 mEq/L (ref 3.7–5.3)
Sodium: 126 mEq/L — ABNORMAL LOW (ref 137–147)

## 2013-07-01 LAB — VANCOMYCIN, RANDOM: VANCOMYCIN RM: 23.3 ug/mL

## 2013-07-01 LAB — APTT: aPTT: 39 seconds — ABNORMAL HIGH (ref 24–37)

## 2013-07-01 LAB — BODY FLUID CULTURE
Culture: NO GROWTH
Gram Stain: NONE SEEN
SPECIAL REQUESTS: NORMAL

## 2013-07-01 LAB — CBC
HCT: 25.1 % — ABNORMAL LOW (ref 36.0–46.0)
Hemoglobin: 8.5 g/dL — ABNORMAL LOW (ref 12.0–15.0)
MCH: 29 pg (ref 26.0–34.0)
MCHC: 33.9 g/dL (ref 30.0–36.0)
MCV: 85.7 fL (ref 78.0–100.0)
PLATELETS: 208 10*3/uL (ref 150–400)
RBC: 2.93 MIL/uL — ABNORMAL LOW (ref 3.87–5.11)
RDW: 17.8 % — AB (ref 11.5–15.5)
WBC: 17.1 10*3/uL — AB (ref 4.0–10.5)

## 2013-07-01 LAB — GLUCOSE, CAPILLARY
Glucose-Capillary: 101 mg/dL — ABNORMAL HIGH (ref 70–99)
Glucose-Capillary: 140 mg/dL — ABNORMAL HIGH (ref 70–99)

## 2013-07-01 LAB — D-DIMER, QUANTITATIVE: D-Dimer, Quant: 1.05 ug/mL-FEU — ABNORMAL HIGH (ref 0.00–0.48)

## 2013-07-01 LAB — CORTISOL: Cortisol, Plasma: 20 ug/dL

## 2013-07-01 MED ORDER — HYDROCODONE-ACETAMINOPHEN 5-325 MG PO TABS
1.0000 | ORAL_TABLET | Freq: Four times a day (QID) | ORAL | Status: DC | PRN
  Administered 2013-07-01: 2 via ORAL
  Administered 2013-07-02 (×2): 1 via ORAL
  Filled 2013-07-01 (×2): qty 2
  Filled 2013-07-01: qty 1

## 2013-07-01 MED ORDER — INSULIN ASPART 100 UNIT/ML ~~LOC~~ SOLN
0.0000 [IU] | Freq: Three times a day (TID) | SUBCUTANEOUS | Status: DC
  Administered 2013-07-02: 2 [IU] via SUBCUTANEOUS
  Administered 2013-07-02: 3 [IU] via SUBCUTANEOUS
  Administered 2013-07-02: 2 [IU] via SUBCUTANEOUS
  Administered 2013-07-03: 1 [IU] via SUBCUTANEOUS

## 2013-07-01 NOTE — Progress Notes (Signed)
[facility]  Valerie Yoder:[id number] DOB: Feb 19, 1930 DOA: 06/28/2013 PCP: Laverna Peace, NP  Time spent : 35 mins  Brief narrative: 80 year old female with end-stage renal disease currently on peritoneal dialysis, prior bouts of peritonitis, atrial fibrillation, heart failure, hyperlipidemia, and hypertension who presented to [facility] ED with one week duration of abdominal pain at the site of her peritoneal cath. This had been associated with nausea and 8-10 episodes of non bloody vomiting. Pt denied chest pain or shortness of breath, no fevers, chills. 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   In ED, pt noted to be hypotensive with SBP in 80's. Other vitals stable, T 97.4 F, HR 80 -90 bpm, oxygen saturation 100%. Nephrology consulted.   HPI/Subjective: Pt c/o some pain in low L chest which "goes through to my back."  She states it is a dull pressure type pain and that she has not had it before.  She also c/o persistent nauseas leading to vomiting with attempts to eat.    Assessment/Plan:  Presumed recurring peritonitis associated w/ PD cath - ESRD PD dependent  See discussion per Nephrology - plan was to attempt to convert to HD, but low BP has precluded same - Neph discussion possible exchange of PD catheter w/ Gen Surg, who will see formally in AM   Low L chest pain Differential includes dissection, but could also be related to peritoneal pain - given that pt is ESRD I suspect CT w/ contrast would be ok - will check with Renal and consider CT angio of chest - if this is a descending dissection, medical tx would be the goal and her BP is well controlled/hypotensive - if it were to be an ascending dissection, I do not think she would be a candidate for surgical intervention (as she is not even felt to be a safe  candidate for abdom surgery) - will check a d-dimer as it has signif negative predictive value if <500 ng/ml  Hypotension a chronic issue (2014 D/C summary documented SBP 70-90) - midodrine added per Nephrology - BP slightly improved this AM   Normocytic anemia due to CKD - cont Epogen - may require transfusion if downward trend continues - no evidence of blood loss, but Nephrology has stopped lovenox as precaution  Chronic Diastolic CHF - grade 1  Well compensated at present - management per dialysis   Coronary artery disease cont Plavix and baby ASA - has inferolateral TW inversions/biphasic T waves new from 2014 EKG - TTE w/o focal WMA - would not presently be a candidate for anything other than medical tx even if proven to have new lesions   Atrial fibrillation rate controlled and maintaining NSR - cont Pacerone - no BB or CCB due to hypotension   Hypothyroidism TSH in normal range, but not at goal of 1.0 - will not adjust dose in setting of afib - cont Synthroid  Diabetes mellitus HgbA1c 6.5 - follow CBG - was not on meds prior to admit   Moderate malnutrition in the context of chronic illness   DVT prophylaxis: SCDs Code Status: Full Family Communication: No family at bedside Disposition Plan:  SDU  Consultants: Nephrology Gen Surgery   Procedures: TTE - 5/15 - EF 55-60% - no WMA - grade 1 DD - mild to mod Ao regurg - no pericardial effusion  Antibiotics: Levaquin 5/14 > Rifampin 5/14 > Vancomycin 5/14 >  Objective: Blood pressure 107/52, pulse 88, temperature 98.3 F (36.8 C), temperature source Oral, resp. rate 18, height 5\' 2"  (1.575 m), weight 50.213 kg (110 lb 11.2 oz), SpO2 96.00%.  Intake/Output Summary (Last 24 hours) at 07/01/13 1158 Last data filed at 07/01/13 1000  Gross per 24 hour  Intake    240 ml  Output    125 ml  Net    115 ml   Exam: General: No acute respiratory distress Lungs: Clear to auscultation bilaterally without wheezes or  crackles Cardiovascular: Regular rate and rhythm without murmur gallop or rub  Abdomen: Mildly and diffusely tender without guarding or rebound, soft, bowel sounds positive, no ascites, no appreciable mass Musculoskeletal: No significant cyanosis, clubbing of bilateral lower extremities - no signif edema B LE   Scheduled Meds:  Scheduled Meds: . amiodarone  100 mg Oral Daily  . aspirin  81 mg Oral Daily  . clopidogrel  75 mg Oral Daily  . diclofenac sodium  2 g Topical QID  . docusate sodium  100 mg Oral BID  . heparin subcutaneous  5,000 Units Subcutaneous 3 times per day  . levofloxacin (LEVAQUIN) IV  500 mg Intravenous Q48H  . levothyroxine  75 mcg Oral QAC breakfast  . megestrol  400 mg Oral Daily  . midodrine  10 mg Oral TID WC  . mirtazapine  15 mg Oral QHS  . pantoprazole  40 mg Oral Daily  . rifampin  300 mg Oral Daily  . rOPINIRole  0.25 mg Oral QHS  . sevelamer carbonate  2,400 mg Oral TID WC  . simvastatin  20 mg Oral q1800   Data Reviewed: Basic Metabolic Panel:  Recent Labs Lab 06/28/13 1315 06/30/13 0258 07/01/13 0330  NA 133* 131* 126*  K 4.4 3.8 4.4  CL 89* 91* 88*  CO2 23 20 18*  GLUCOSE 145* 94 95  BUN 28* 38* 47*  CREATININE 5.84* 7.87* 8.66*  CALCIUM 9.5 8.2* 8.2*  MG 1.5  --   --   PHOS 5.0*  --  5.1*   Liver Function Tests:  Recent Labs Lab 06/28/13 1315 06/30/13 0258 07/01/13 0330  AST 17 9  --   ALT 10 7  --   ALKPHOS 103 90  --   BILITOT 0.9 0.8  --   PROT 6.5 4.2*  --   ALBUMIN 2.2* 1.4* 1.5*   CBC:  Recent Labs Lab 06/28/13 1315 06/29/13 0300 06/30/13 0258 07/01/13 0330  WBC 14.5* 11.4* 11.0* 17.1*  NEUTROABS 10.5*  --   --   --   HGB 12.3 9.6* 8.6* 8.5*  HCT 37.3 28.6* 26.2* 25.1*  MCV 88.0 88.5 88.8 85.7  PLT 227 160 170 208   Cardiac Enzymes:  Recent Labs Lab 06/28/13 1315  TROPONINI <0.30    Recent Results (from the past 240 hour(s))  CULTURE, BLOOD (ROUTINE X 2)     Status: None   Collection Time     06/28/13  1:31 PM      Result Value Ref Range Status   Specimen Description BLOOD ARM RIGHT   Final   Special Requests BOTTLES DRAWN AEROBIC AND ANAEROBIC 5CC   Final   Culture  Setup Time     Final   Value: 06/28/2013 18:52     Performed at [facility]   Culture     Final   Value:        BLOOD  CULTURE RECEIVED NO GROWTH TO DATE CULTURE WILL BE HELD FOR 5 DAYS BEFORE ISSUING A FINAL NEGATIVE REPORT     Performed at [facility]   Report Status PENDING   Incomplete  GRAM STAIN     Status: None   Collection Time    06/28/13  1:48 PM      Result Value Ref Range Status   Specimen Description FLUID PERITONEAL DIALYSATE   Final   Special Requests Normal   Final   Gram Stain     Final   Value: CYTOSPIN PREP     WBC PRESENT,BOTH PMN AND MONONUCLEAR     NO ORGANISMS SEEN   Report Status 06/28/2013 FINAL   Final  BODY FLUID CULTURE     Status: None   Collection Time    06/28/13  1:48 PM      Result Value Ref Range Status   Specimen Description PERITONEAL DIALYSATE FLUID   Final   Special Requests Normal   Final   Gram Stain     Final   Value: NO WBC SEEN     NO ORGANISMS SEEN     Performed at [facility]   Culture     Final   Value: NO GROWTH 2 DAYS     Performed at [facility]   Report Status PENDING   Incomplete  CULTURE, BLOOD (ROUTINE X 2)     Status: None   Collection Time    06/28/13  2:05 PM      Result Value Ref Range Status   Specimen Description BLOOD ARM RIGHT   Final   Special Requests BOTTLES DRAWN AEROBIC AND ANAEROBIC 5CC   Final   Culture  Setup Time     Final   Value: 06/28/2013 18:52     Performed at [facility]   Culture     Final   Value:        BLOOD CULTURE RECEIVED NO GROWTH TO DATE CULTURE WILL BE HELD FOR 5 DAYS BEFORE ISSUING A FINAL NEGATIVE REPORT     Performed at [facility]   Report Status PENDING   Incomplete  MRSA PCR SCREENING     Status: None   Collection Time    06/28/13  9:11 PM       Result Value Ref Range Status   MRSA by PCR NEGATIVE  NEGATIVE Final   Comment:            The GeneXpert MRSA Assay (FDA     approved for NASAL specimens     only), is one component of a     comprehensive MRSA colonization     surveillance program. It is not     intended to diagnose MRSA     infection nor to guide or     monitor treatment for     MRSA infections.     Studies:  Recent x-ray studies have been reviewed in detail by the Attending Physician  Cherene Altes, MD [facility] For Consults/Admissions - Flow Manager - [phone] Office  [phone] Pager [phone]  On-Call/Text Page:      [email or web address]      password [facility]  07/01/2013, 11:58 AM   LOS: 3 days

## 2013-07-01 NOTE — Progress Notes (Signed)
ANTIBIOTIC CONSULT NOTE - FOLLOW UP  Pharmacy Consult for vancomycin and Levaquin Indication: empiric abdominal infection (PD catheter in place)   Allergies  Allergen Reactions  . Latex   . Other     Pneumonia shot   . Penicillins   . Sulfa Antibiotics     Patient Measurements: Height: 5\' 2"  (157.5 cm) Weight: 110 lb 11.2 oz (50.213 kg) IBW/kg (Calculated) : 50.1  Vital Signs: Temp: 98.2 F (36.8 C) (05/17 1250) Temp src: Oral (05/17 1250) BP: 108/54 mmHg (05/17 1250) Pulse Rate: 88 (05/17 0837) Intake/Output from previous day: 05/16 0701 - 05/17 0700 In: 240 [P.O.:240] Out: 125 [Emesis/NG output:125] Intake/Output from this shift: Total I/O In: 120 [P.O.:120] Out: -   Labs:  Recent Labs  06/29/13 0300 06/30/13 0258 07/01/13 0330  WBC 11.4* 11.0* 17.1*  HGB 9.6* 8.6* 8.5*  PLT 160 170 208  CREATININE  --  7.87* 8.66*   Estimated Creatinine Clearance: 4 ml/min (by C-G formula based on Cr of 8.66).  Recent Labs  07/01/13 0330  VANCORANDOM 23.3     Microbiology: Recent Results (from the past 720 hour(s))  CULTURE, BLOOD (ROUTINE X 2)     Status: None   Collection Time    06/28/13  1:31 PM      Result Value Ref Range Status   Specimen Description BLOOD ARM RIGHT   Final   Special Requests BOTTLES DRAWN AEROBIC AND ANAEROBIC 5CC   Final   Culture  Setup Time     Final   Value: 06/28/2013 18:52     Performed at [facility]   Culture     Final   Value:        BLOOD CULTURE RECEIVED NO GROWTH TO DATE CULTURE WILL BE HELD FOR 5 DAYS BEFORE ISSUING A FINAL NEGATIVE REPORT     Performed at [facility]   Report Status PENDING   Incomplete  GRAM STAIN     Status: None   Collection Time    06/28/13  1:48 PM      Result Value Ref Range Status   Specimen Description FLUID PERITONEAL DIALYSATE   Final   Special Requests Normal   Final   Gram Stain     Final   Value: CYTOSPIN PREP     WBC PRESENT,BOTH PMN AND MONONUCLEAR     NO ORGANISMS  SEEN   Report Status 06/28/2013 FINAL   Final  BODY FLUID CULTURE     Status: None   Collection Time    06/28/13  1:48 PM      Result Value Ref Range Status   Specimen Description PERITONEAL DIALYSATE FLUID   Final   Special Requests Normal   Final   Gram Stain     Final   Value: NO WBC SEEN     NO ORGANISMS SEEN     Performed at [facility]   Culture     Final   Value: NO GROWTH 3 DAYS     Performed at [facility]   Report Status 07/01/2013 FINAL   Final  CULTURE, BLOOD (ROUTINE X 2)     Status: None   Collection Time    06/28/13  2:05 PM      Result Value Ref Range Status   Specimen Description BLOOD ARM RIGHT   Final   Special Requests BOTTLES DRAWN AEROBIC AND ANAEROBIC 5CC   Final   Culture  Setup Time     Final  Value: 06/28/2013 18:52     Performed at [facility]   Culture     Final   Value:        BLOOD CULTURE RECEIVED NO GROWTH TO DATE CULTURE WILL BE HELD FOR 5 DAYS BEFORE ISSUING A FINAL NEGATIVE REPORT     Performed at [facility]   Report Status PENDING   Incomplete  MRSA PCR SCREENING     Status: None   Collection Time    06/28/13  9:11 PM      Result Value Ref Range Status   MRSA by PCR NEGATIVE  NEGATIVE Final   Comment:            The GeneXpert MRSA Assay (FDA     approved for NASAL specimens     only), is one component of a     comprehensive MRSA colonization     surveillance program. It is not     intended to diagnose MRSA     infection nor to guide or     monitor treatment for     MRSA infections.    Anti-infectives   Start     Dose/Rate Route Frequency Ordered Stop   06/30/13 1800  levofloxacin (LEVAQUIN) IVPB 500 mg     500 mg 100 mL/hr over 60 Minutes Intravenous Every 48 hours 06/28/13 1830     06/28/13 1845  rifampin (RIFADIN) capsule 300 mg     300 mg Oral Daily 06/28/13 1843     06/28/13 1740  rifampin (RIFADIN) capsule 300 mg  Status:  Discontinued     300 mg Oral Daily 06/28/13 1653 06/28/13 1844    06/28/13 1330  vancomycin (VANCOCIN) IVPB 1000 mg/200 mL premix     1,000 mg 200 mL/hr over 60 Minutes Intravenous  Once 06/28/13 1321 06/28/13 1512   06/28/13 1330  levofloxacin (LEVAQUIN) IVPB 500 mg     500 mg 100 mL/hr over 60 Minutes Intravenous  Once 06/28/13 1321 06/28/13 1616      Assessment: 80 yo PD patients admitted 06/28/2013 with 1 week of pain around peritoneal catheter. She received recent treatment for peritonitis with IV vancomycin and Fortaz.  Patient on PTA rifampin, which was continued inpatient.  Pharmacy consulted to dose vancomycin and levofloxacin.  She was given vancomyin IV 1g x1 dose on 5/14 and received no further doses.  Patient is being evaluated whether she can tolerate HD d/t hypotension or undergo surgery for replacement PD catheter.  A vancomycin random level today reveals a level of 23.3.  Rifampin PO continued from PTA 5/14 Vanc >> 5/14 Levofloxacin >>  5/17 VR = 23.3  5/14 BCx>> ngtd 5/14 peritoneal fluid>> NG final 5/14 peritoneal fluid gram stain >> neg  Goal of Therapy:  Resolution/control of infection Vancomycin trough: 15-20 mg/L  Plan:  - continue Levaquin IV 500mg  q48h - redraw vancomycin level tomorrow with AM labs, consider re-dosing if <20 - monitor kidney function, WBC, temperature curve, any cultures and clinical progression - surgery consulted to evaluate whether can replace PD cath, not receiving HD currently - continue to f/u PD vs HD schedule for further vancomycin dosing  Ovid Curd E. Jacqlyn Larsen, PharmD Clinical Pharmacist - Resident Pager: [phone] Pharmacy: [phone] 07/01/2013 3:19 PM

## 2013-07-01 NOTE — Evaluation (Signed)
Physical Therapy Evaluation Patient Details Name: Valerie Yoder MRN: [id number] DOB: 17-Jun-1930 Today's Date: 07/01/2013   History of Present Illness  80 year old female with end-stage renal disease currently on peritoneal dialysis, prior bouts of peritonitis, atrial fibrillation, heart failure, hyperlipidemia, and hypertension who presented to [facility] ED with one week duration of abdominal pain at the site of her peritoneal cath. This had been associated with nausea and 8-10 episodes of non bloody vomiting; has been treated for infection of PD port in teh past, now considering switching to HD; unable to have surgery for access until BP more stable  Clinical Impression  Pt admitted with above. Pt currently with functional limitations due to the deficits listed below (see PT Problem List).  Pt will benefit from skilled PT to increase their independence and safety with mobility to allow discharge to the venue listed below.       Follow Up Recommendations CIR    Equipment Recommendations  Rolling walker with 5" wheels;3in1 (PT)    Recommendations for Other Services OT consult     Precautions / Restrictions Precautions Precautions: Fall      Mobility  Bed Mobility                  Transfers Overall transfer level: Needs assistance Equipment used: Rolling walker (2 wheeled) Transfers: Sit to/from Stand Sit to Stand: Mod assist         General transfer comment: Light mod anti-gravity assist for successful transition to standing  Ambulation/Gait             General Gait Details: Offered amb, however pt declined; feeling weak and nauseated  Stairs            Wheelchair Mobility    Modified Rankin (Stroke Patients Only)       Balance Overall balance assessment: Needs assistance         Standing balance support: Bilateral upper extremity supported Standing balance-Leahy Scale: Poor Standing balance comment: Dependent on UE support in standing; cues  to self-monitor for activity tolerance                             Pertinent Vitals/Pain See other PT note of this date    Home Living Family/patient expects to be discharged to:: Private residence Living Arrangements: Children Available Help at Discharge: Family;Available 24 hours/day Type of Home: House Home Access: Ramped entrance     Home Layout: One level Home Equipment: Walker - 2 wheels;Bedside commode;Cane - single point Additional Comments: Daughter in law assists with PD    Prior Function Level of Independence: Independent               Hand Dominance        Extremity/Trunk Assessment   Upper Extremity Assessment: Generalized weakness           Lower Extremity Assessment: Generalized weakness         Communication   Communication: No difficulties  Cognition Arousal/Alertness: Awake/alert Behavior During Therapy: WFL for tasks assessed/performed Overall Cognitive Status: Within Functional Limits for tasks assessed                      General Comments      Exercises        Assessment/Plan    PT Assessment Patient needs continued PT services  PT Diagnosis Difficulty walking;Generalized weakness   PT Problem List Decreased strength;Decreased activity tolerance;Decreased balance;Decreased mobility;Decreased  knowledge of use of DME;Cardiopulmonary status limiting activity  PT Treatment Interventions DME instruction;Gait training;Stair training;Functional mobility training;Therapeutic activities;Therapeutic exercise;Patient/family education   PT Goals (Current goals can be found in the Care Plan section) Acute Rehab PT Goals Patient Stated Goal: wants to feel better PT Goal Formulation: With patient Time For Goal Achievement: 07/15/13 Potential to Achieve Goals: Good    Frequency Min 3X/week   Barriers to discharge   Decr activity tol    Co-evaluation               End of Session   Activity Tolerance:  Other (comment) (limited by weakness, nausea) Patient left: in chair;with call bell/phone within reach Nurse Communication: Mobility status         Time:  -      Charges:         PT G Codes:          Mila Homer Khasir Woodrome 07/01/2013, 12:36 PM Roney Marion, [place] Pager [phone] Office [phone]

## 2013-07-01 NOTE — Progress Notes (Signed)
[facility] ROUNDING NOTE   Subjective:   Interval History: tired but resting in bed, long discussion about end of life, she does seem to be a little less hypotensive although pressures are variable. Concerning that WBC continues to rise despite antibiotics. Dr Excell Seltzer will have surgery see patient tomorrow and assess surgical risk of a PD catheter replacement and general anaesthetic. He does not think that she will survive a general anesthetic. We shall resume PD tonight    Objective:  Vital signs in last 24 hours:  Temp:  [97.5 F (36.4 C)-98.9 F (37.2 C)] 98.3 F (36.8 C) (05/17 0821) Pulse Rate:  [65-100] 88 (05/17 0837) Resp:  [18] 18 (05/16 1656) BP: (81-116)/(21-52) 107/52 mmHg (05/17 0837) SpO2:  [96 %-100 %] 96 % (05/17 0821) Weight:  [50.213 kg (110 lb 11.2 oz)] 50.213 kg (110 lb 11.2 oz) (05/17 0524)  Weight change:  Filed Weights   06/28/13 2135 07/01/13 0524  Weight: 49.6 kg (109 lb 5.6 oz) 50.213 kg (110 lb 11.2 oz)    Intake/Output: I/O last 3 completed shifts: In: 480 [P.O.:480] Out: 125 [Emesis/NG output:125]   Intake/Output this shift:  Total I/O In: 120 [P.O.:120] Out: -   CVS- Atrial fibrillation / sinus  RS- CTA  ABD- BS present soft non-distended PD catheter exit site crust  EXT- no edema    Basic Metabolic Panel:  Recent Labs Lab 06/28/13 1315 06/30/13 0258 07/01/13 0330  NA 133* 131* 126*  K 4.4 3.8 4.4  CL 89* 91* 88*  CO2 23 20 18*  GLUCOSE 145* 94 95  BUN 28* 38* 47*  CREATININE 5.84* 7.87* 8.66*  CALCIUM 9.5 8.2* 8.2*  MG 1.5  --   --   PHOS 5.0*  --  5.1*    Liver Function Tests:  Recent Labs Lab 06/28/13 1315 06/30/13 0258 07/01/13 0330  AST 17 9  --   ALT 10 7  --   ALKPHOS 103 90  --   BILITOT 0.9 0.8  --   PROT 6.5 4.2*  --   ALBUMIN 2.2* 1.4* 1.5*   No results found for this basename: LIPASE, AMYLASE,  in the last 168 hours No results found for this basename: AMMONIA,  in the last 168  hours  CBC:  Recent Labs Lab 06/28/13 1315 06/29/13 0300 06/30/13 0258 07/01/13 0330  WBC 14.5* 11.4* 11.0* 17.1*  NEUTROABS 10.5*  --   --   --   HGB 12.3 9.6* 8.6* 8.5*  HCT 37.3 28.6* 26.2* 25.1*  MCV 88.0 88.5 88.8 85.7  PLT 227 160 170 208    Cardiac Enzymes:  Recent Labs Lab 06/28/13 1315  TROPONINI <0.30    BNP: No components found with this basename: POCBNP,   CBG: No results found for this basename: GLUCAP,  in the last 168 hours  Microbiology: Results for orders placed during the hospital encounter of 06/28/13  CULTURE, BLOOD (ROUTINE X 2)     Status: None   Collection Time    06/28/13  1:31 PM      Result Value Ref Range Status   Specimen Description BLOOD ARM RIGHT   Final   Special Requests BOTTLES DRAWN AEROBIC AND ANAEROBIC 5CC   Final   Culture  Setup Time     Final   Value: 06/28/2013 18:52     Performed at [facility]   Culture     Final   Value:        BLOOD CULTURE RECEIVED  NO GROWTH TO DATE CULTURE WILL BE HELD FOR 5 DAYS BEFORE ISSUING A FINAL NEGATIVE REPORT     Performed at [facility]   Report Status PENDING   Incomplete  GRAM STAIN     Status: None   Collection Time    06/28/13  1:48 PM      Result Value Ref Range Status   Specimen Description FLUID PERITONEAL DIALYSATE   Final   Special Requests Normal   Final   Gram Stain     Final   Value: CYTOSPIN PREP     WBC PRESENT,BOTH PMN AND MONONUCLEAR     NO ORGANISMS SEEN   Report Status 06/28/2013 FINAL   Final  BODY FLUID CULTURE     Status: None   Collection Time    06/28/13  1:48 PM      Result Value Ref Range Status   Specimen Description PERITONEAL DIALYSATE FLUID   Final   Special Requests Normal   Final   Gram Stain     Final   Value: NO WBC SEEN     NO ORGANISMS SEEN     Performed at [facility]   Culture     Final   Value: NO GROWTH 2 DAYS     Performed at [facility]   Report Status PENDING   Incomplete  CULTURE, BLOOD (ROUTINE  X 2)     Status: None   Collection Time    06/28/13  2:05 PM      Result Value Ref Range Status   Specimen Description BLOOD ARM RIGHT   Final   Special Requests BOTTLES DRAWN AEROBIC AND ANAEROBIC 5CC   Final   Culture  Setup Time     Final   Value: 06/28/2013 18:52     Performed at [facility]   Culture     Final   Value:        BLOOD CULTURE RECEIVED NO GROWTH TO DATE CULTURE WILL BE HELD FOR 5 DAYS BEFORE ISSUING A FINAL NEGATIVE REPORT     Performed at [facility]   Report Status PENDING   Incomplete  MRSA PCR SCREENING     Status: None   Collection Time    06/28/13  9:11 PM      Result Value Ref Range Status   MRSA by PCR NEGATIVE  NEGATIVE Final   Comment:            The GeneXpert MRSA Assay (FDA     approved for NASAL specimens     only), is one component of a     comprehensive MRSA colonization     surveillance program. It is not     intended to diagnose MRSA     infection nor to guide or     monitor treatment for     MRSA infections.    Coagulation Studies:  Recent Labs  06/29/13 1345  LABPROT 15.5*  INR 1.26    Urinalysis: No results found for this basename: COLORURINE, APPERANCEUR, LABSPEC, PHURINE, GLUCOSEU, HGBUR, BILIRUBINUR, KETONESUR, PROTEINUR, UROBILINOGEN, NITRITE, LEUKOCYTESUR,  in the last 72 hours    Imaging: No results found.   Medications:     . amiodarone  100 mg Oral Daily  . aspirin  81 mg Oral Daily  . clopidogrel  75 mg Oral Daily  . diclofenac sodium  2 g Topical QID  . docusate sodium  100 mg Oral BID  . heparin subcutaneous  5,000 Units  Subcutaneous 3 times per day  . levofloxacin (LEVAQUIN) IV  500 mg Intravenous Q48H  . levothyroxine  75 mcg Oral QAC breakfast  . megestrol  400 mg Oral Daily  . midodrine  10 mg Oral TID WC  . mirtazapine  15 mg Oral QHS  . pantoprazole  40 mg Oral Daily  . rifampin  300 mg Oral Daily  . rOPINIRole  0.25 mg Oral QHS  . sevelamer carbonate  2,400 mg Oral TID WC  .  simvastatin  20 mg Oral q1800   acetaminophen, gabapentin, HYDROcodone-acetaminophen, ondansetron (ZOFRAN) IV, ondansetron, polyethylene glycol, promethazine  Assessment/ Plan:  ESRD- I doubt patient will tolerate hemodialysis no HD catheter  ANEMIA- Drop in Hb stop lovenox   HB stabilized HTN/VOL- controlled Echo Aortic regurgitation   Cortisol level pending ACCESS- PD catheter  OTHER- Recurrent peritonitis Problematic but BP too low for HD Add midodrine 10mg  TID  Blood pressure still very soft. Discussed with Dr Adonis Housekeeper regarding feasibility of exchange of PD catheter. He feels hypotension will limit general anesthetic . There are no good solutions and continuing PD with antibiotics may be only possibility.    LOS: [place] @TODAY @11 :42 AM

## 2013-07-01 NOTE — Progress Notes (Signed)
Physical Therapy Note  Seen for PT eval; Full note to follow;   Reported dizziness with activity -- vitals as follows:   07/01/13 0828 07/01/13 0833 07/01/13 0837  Vital Signs  Pulse Rate --  100 88  Pulse Rate Source --  Monitor Monitor  BP ! 96/48 mmHg ! 116/47 mmHg ! 107/52 mmHg  BP Location Right arm (forearm) Right arm (forearm) Right arm (forearm)  BP Method Automatic Automatic Automatic  Patient Position (if appropriate) Sitting (after briefly standing) Standing Sitting (in recliner post standing)    Roney Marion, [place] Pager [phone] Office [phone]

## 2013-07-02 ENCOUNTER — Encounter (HOSPITAL_COMMUNITY): Payer: Self-pay | Admitting: General Surgery

## 2013-07-02 LAB — CBC
HCT: 24.2 % — ABNORMAL LOW (ref 36.0–46.0)
Hemoglobin: 8.3 g/dL — ABNORMAL LOW (ref 12.0–15.0)
MCH: 29 pg (ref 26.0–34.0)
MCHC: 34.3 g/dL (ref 30.0–36.0)
MCV: 84.6 fL (ref 78.0–100.0)
PLATELETS: 223 10*3/uL (ref 150–400)
RBC: 2.86 MIL/uL — ABNORMAL LOW (ref 3.87–5.11)
RDW: 17.8 % — AB (ref 11.5–15.5)
WBC: 19.5 10*3/uL — AB (ref 4.0–10.5)

## 2013-07-02 LAB — LIPASE, BLOOD: LIPASE: 11 U/L (ref 11–59)

## 2013-07-02 LAB — COMPREHENSIVE METABOLIC PANEL
ALBUMIN: 1.5 g/dL — AB (ref 3.5–5.2)
ALK PHOS: 88 U/L (ref 39–117)
ALT: 7 U/L (ref 0–35)
AST: 10 U/L (ref 0–37)
BILIRUBIN TOTAL: 0.9 mg/dL (ref 0.3–1.2)
BUN: 52 mg/dL — ABNORMAL HIGH (ref 6–23)
CHLORIDE: 83 meq/L — AB (ref 96–112)
CO2: 17 mEq/L — ABNORMAL LOW (ref 19–32)
Calcium: 8.3 mg/dL — ABNORMAL LOW (ref 8.4–10.5)
Creatinine, Ser: 9.27 mg/dL — ABNORMAL HIGH (ref 0.50–1.10)
GFR calc non Af Amer: 3 mL/min — ABNORMAL LOW (ref >90)
GFR, EST AFRICAN AMERICAN: 4 mL/min — AB (ref >90)
GLUCOSE: 157 mg/dL — AB (ref 70–99)
POTASSIUM: 4 meq/L (ref 3.7–5.3)
Sodium: 123 mEq/L — ABNORMAL LOW (ref 137–147)
Total Protein: 4.8 g/dL — ABNORMAL LOW (ref 6.0–8.3)

## 2013-07-02 LAB — VANCOMYCIN, RANDOM: Vancomycin Rm: 26 ug/mL

## 2013-07-02 LAB — GLUCOSE, CAPILLARY
GLUCOSE-CAPILLARY: 169 mg/dL — AB (ref 70–99)
Glucose-Capillary: 202 mg/dL — ABNORMAL HIGH (ref 70–99)
Glucose-Capillary: 189 mg/dL — ABNORMAL HIGH (ref 70–99)
Glucose-Capillary: 160 mg/dL — ABNORMAL HIGH (ref 70–99)

## 2013-07-02 LAB — PROTIME-INR
INR: 1.07 (ref 0.00–1.49)
Prothrombin Time: 13.7 seconds (ref 11.6–15.2)

## 2013-07-02 MED ORDER — DARBEPOETIN ALFA-POLYSORBATE 100 MCG/0.5ML IJ SOLN
100.0000 ug | INTRAMUSCULAR | Status: DC
  Administered 2013-07-02: 100 ug via SUBCUTANEOUS
  Filled 2013-07-02: qty 0.5

## 2013-07-02 MED ORDER — DOCUSATE SODIUM 50 MG/5ML PO LIQD
100.0000 mg | Freq: Two times a day (BID) | ORAL | Status: DC
  Administered 2013-07-02: 100 mg via ORAL
  Filled 2013-07-02 (×3): qty 10

## 2013-07-02 MED ORDER — DELFLEX-LC/2.5% DEXTROSE 394 MOSM/L IP SOLN: INTRAPERITONEAL | Status: DC

## 2013-07-02 MED ORDER — HEPARIN SODIUM (PORCINE) 1000 UNIT/ML IJ SOLN
500.0000 [IU] | INTRAMUSCULAR | Status: DC
  Filled 2013-07-02: qty 0.5

## 2013-07-02 MED ORDER — DELFLEX-LC/2.5% DEXTROSE 394 MOSM/L IP SOLN
INTRAPERITONEAL | Status: DC
Start: 2013-07-02 — End: 2013-07-02

## 2013-07-02 MED ORDER — DELFLEX-LC/2.5% DEXTROSE 394 MOSM/L IP SOLN: Freq: Once | INTRAPERITONEAL | Status: DC

## 2013-07-02 NOTE — Progress Notes (Signed)
Rehab admissions - Received pre-screen request for possible inpatient rehab consult. Noted that pt's participation with PT was quite limited and pt had the tolerance to go sit to stand only. Please contact me if pt's activity tolerance increases.  Thanks,  Nanetta Batty, [place] [facility] Admissions Coordinator [phone]

## 2013-07-02 NOTE — Progress Notes (Addendum)
[facility]  JAMARIAH TONY KVQ:[id number] DOB: 1930-07-06 DOA: 06/28/2013 PCP: Laverna Peace, NP  Time spent : 35 mins  Brief narrative: 80 year old female with end-stage renal disease currently on peritoneal dialysis, prior bouts of peritonitis, atrial fibrillation, heart failure, hyperlipidemia, and hypertension who presented to [facility] ED with one week duration of abdominal pain at the site of her peritoneal cath. This had been associated with nausea and 8-10 episodes of non bloody vomiting. Pt denied chest pain or shortness of breath, no fevers, chills. 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   In ED, pt noted to be hypotensive with SBP in 80's. Other vitals stable, T 97.4 F, HR 80 -90 bpm, oxygen saturation 100%. Nephrology consulted.   HPI/Subjective: Pt says still with some back pain like yesterday but not as severe.  Assessment/Plan:  Presumed recurring peritonitis associated w/ PD cath - ESRD PD dependent  See discussion per Nephrology - plan was to attempt to convert to HD, but low BP has precluded same - Neph discussed possible exchange of PD catheter w/ Gen Surg and per their d/w the patient the patient has decided to not exchange PD cath for now and just continue anbx's when indicated - now w/ PD fluid cx returning w/ no growth will stop abx and follow   Low L chest pain In f/u, hx now more suggestive of abdom source - pt appears to have near constant migratory abdom pain - will not pursue vascular w/u at this time - lipase normal   Hypotension a chronic issue (2014 D/C summary documented SBP 70-90) - midodrine added per Nephrology - BP slightly improved   Normocytic anemia due to CKD - cont Epogen - no evidence of blood loss  Chronic Diastolic CHF - grade 1  Well compensated at present -  management per dialysis   Coronary artery disease cont Plavix and baby ASA - has inferolateral TW inversions/biphasic T waves new from 2014 EKG - TTE w/o focal WMA - would not presently be a candidate for anything other than medical tx even if proven to have new lesions   Atrial fibrillation rate controlled - cont Pacerone - no BB or CCB due to hypotension   Hypothyroidism TSH in normal range, but not at goal of 1.0 - will not adjust dose in setting of afib - cont Synthroid  Diabetes mellitus HgbA1c 6.5 - follow CBG - was not on meds prior to admit   Moderate malnutrition in the context of chronic illness    DVT prophylaxis: SCDs Code Status: Full Family Communication: spoke w/ daughter in law at bedside  Disposition Plan:  SDU  Consultants: Nephrology Gen Surgery   Procedures: TTE - 5/15 - EF 55-60% - no WMA - grade 1 DD - mild to mod Ao regurg - no pericardial effusion   Antibiotics: Levaquin 5/14 >5/18 Rifampin 5/14 > Vancomycin 5/14 >5/18  Objective: Blood pressure 77/40, pulse 94, temperature 97.6 F (36.4 C), temperature source Oral, resp. rate 18, height 5\' 2"  (1.575 m), weight 114 lb 6.7 oz (51.9 kg), SpO2 99.00%.  Intake/Output Summary (Last 24 hours) at 07/02/13 1243 Last data filed at 07/02/13 1100  Gross per 24 hour  Intake    100 ml  Output    100 ml  Net      0 ml   Exam:  General: No acute respiratory distress Lungs: Clear to auscultation bilaterally without wheezes or crackles Cardiovascular: Regular rate and rhythm without murmur gallop or rub  Abdomen: Tender in LUW without guarding or rebound, soft, bowel sounds positive, no ascites, no appreciable mass Musculoskeletal: No significant cyanosis, clubbing of bilateral lower extremities - no signif edema B LE   Scheduled Meds:  Scheduled Meds: . amiodarone  100 mg Oral Daily  . aspirin  81 mg Oral Daily  . clopidogrel  75 mg Oral Daily  . diclofenac sodium  2 g Topical QID  . docusate sodium   100 mg Oral BID  . heparin subcutaneous  5,000 Units Subcutaneous 3 times per day  . insulin aspart  0-9 Units Subcutaneous TID WC  . levofloxacin (LEVAQUIN) IV  500 mg Intravenous Q48H  . levothyroxine  75 mcg Oral QAC breakfast  . megestrol  400 mg Oral Daily  . midodrine  10 mg Oral TID WC  . mirtazapine  15 mg Oral QHS  . pantoprazole  40 mg Oral Daily  . rifampin  300 mg Oral Daily  . rOPINIRole  0.25 mg Oral QHS  . sevelamer carbonate  2,400 mg Oral TID WC  . simvastatin  20 mg Oral q1800   Data Reviewed: Basic Metabolic Panel:  Recent Labs Lab 06/28/13 1315 06/30/13 0258 07/01/13 0330 07/02/13 0330  NA 133* 131* 126* 123*  K 4.4 3.8 4.4 4.0  CL 89* 91* 88* 83*  CO2 23 20 18* 17*  GLUCOSE 145* 94 95 157*  BUN 28* 38* 47* 52*  CREATININE 5.84* 7.87* 8.66* 9.27*  CALCIUM 9.5 8.2* 8.2* 8.3*  MG 1.5  --   --   --   PHOS 5.0*  --  5.1*  --    Liver Function Tests:  Recent Labs Lab 06/28/13 1315 06/30/13 0258 07/01/13 0330 07/02/13 0330  AST 17 9  --  10  ALT 10 7  --  7  ALKPHOS 103 90  --  88  BILITOT 0.9 0.8  --  0.9  PROT 6.5 4.2*  --  4.8*  ALBUMIN 2.2* 1.4* 1.5* 1.5*   CBC:  Recent Labs Lab 06/28/13 1315 06/29/13 0300 06/30/13 0258 07/01/13 0330 07/02/13 0330  WBC 14.5* 11.4* 11.0* 17.1* 19.5*  NEUTROABS 10.5*  --   --   --   --   HGB 12.3 9.6* 8.6* 8.5* 8.3*  HCT 37.3 28.6* 26.2* 25.1* 24.2*  MCV 88.0 88.5 88.8 85.7 84.6  PLT 227 160 170 208 223   Cardiac Enzymes:  Recent Labs Lab 06/28/13 1315  TROPONINI <0.30    Recent Results (from the past 240 hour(s))  CULTURE, BLOOD (ROUTINE X 2)     Status: None   Collection Time    06/28/13  1:31 PM      Result Value Ref Range Status   Specimen Description BLOOD ARM RIGHT   Final   Special Requests BOTTLES DRAWN AEROBIC AND ANAEROBIC 5CC   Final   Culture  Setup Time     Final   Value: 06/28/2013 18:52     Performed at [facility]   Culture     Final   Value:        BLOOD  CULTURE RECEIVED NO GROWTH TO DATE CULTURE WILL BE HELD FOR 5 DAYS BEFORE ISSUING A FINAL NEGATIVE REPORT     Performed at [facility]   Report Status PENDING   Incomplete  GRAM STAIN  Status: None   Collection Time    06/28/13  1:48 PM      Result Value Ref Range Status   Specimen Description FLUID PERITONEAL DIALYSATE   Final   Special Requests Normal   Final   Gram Stain     Final   Value: CYTOSPIN PREP     WBC PRESENT,BOTH PMN AND MONONUCLEAR     NO ORGANISMS SEEN   Report Status 06/28/2013 FINAL   Final  BODY FLUID CULTURE     Status: None   Collection Time    06/28/13  1:48 PM      Result Value Ref Range Status   Specimen Description PERITONEAL DIALYSATE FLUID   Final   Special Requests Normal   Final   Gram Stain     Final   Value: NO WBC SEEN     NO ORGANISMS SEEN     Performed at [facility]   Culture     Final   Value: NO GROWTH 3 DAYS     Performed at [facility]   Report Status 07/01/2013 FINAL   Final  CULTURE, BLOOD (ROUTINE X 2)     Status: None   Collection Time    06/28/13  2:05 PM      Result Value Ref Range Status   Specimen Description BLOOD ARM RIGHT   Final   Special Requests BOTTLES DRAWN AEROBIC AND ANAEROBIC 5CC   Final   Culture  Setup Time     Final   Value: 06/28/2013 18:52     Performed at [facility]   Culture     Final   Value:        BLOOD CULTURE RECEIVED NO GROWTH TO DATE CULTURE WILL BE HELD FOR 5 DAYS BEFORE ISSUING A FINAL NEGATIVE REPORT     Performed at [facility]   Report Status PENDING   Incomplete  MRSA PCR SCREENING     Status: None   Collection Time    06/28/13  9:11 PM      Result Value Ref Range Status   MRSA by PCR NEGATIVE  NEGATIVE Final   Comment:            The GeneXpert MRSA Assay (FDA     approved for NASAL specimens     only), is one component of a     comprehensive MRSA colonization     surveillance program. It is not     intended to diagnose MRSA     infection  nor to guide or     monitor treatment for     MRSA infections.     Studies:  Recent x-ray studies have been reviewed in detail by the Attending Physician  Erin Hearing, ANP [facility] For Consults/Admissions - Flow Manager - [phone] Office  [phone] Pager [phone]  On-Call/Text Page:      [email or web address]      password [facility]  07/02/2013, 12:43 PM   LOS: 4 days   I have personally examined this patient and reviewed the entire database. I have reviewed the above note, made any necessary editorial changes, and agree with its content.  Cherene Altes, MD [facility]

## 2013-07-02 NOTE — Consult Note (Signed)
Imogene Burn. Georgette Dover, MD, [facility] Surgery  General/ Trauma Surgery  07/02/2013 11:24 AM

## 2013-07-02 NOTE — Progress Notes (Addendum)
ANTIBIOTIC CONSULT NOTE - FOLLOW UP  Pharmacy Consult for vancomycin and Levaquin Indication: empiric abdominal infection (PD catheter in place)   Allergies  Allergen Reactions  . Latex   . Other     Pneumonia shot   . Penicillins   . Sulfa Antibiotics     Patient Measurements: Height: 5\' 2"  (157.5 cm) Weight: 114 lb 6.7 oz (51.9 kg) IBW/kg (Calculated) : 50.1  Vital Signs: Temp: 97.6 F (36.4 C) (05/18 1155) Temp src: Oral (05/18 1155) BP: 77/40 mmHg (05/18 1155) Pulse Rate: 94 (05/18 1155) Intake/Output from previous day: 05/17 0701 - 05/18 0700 In: 120 [P.O.:120] Out: -  Intake/Output from this shift: Total I/O In: 100 [P.O.:100] Out: 100 [Emesis/NG output:100]  Labs:  Recent Labs  06/30/13 0258 07/01/13 0330 07/02/13 0330  WBC 11.0* 17.1* 19.5*  HGB 8.6* 8.5* 8.3*  PLT 170 208 223  CREATININE 7.87* 8.66* 9.27*   Estimated Creatinine Clearance: 3.8 ml/min (by C-G formula based on Cr of 9.27).  Recent Labs  07/01/13 0330 07/02/13 0330  VANCORANDOM 23.3 26.0     Microbiology: Recent Results (from the past 720 hour(s))  CULTURE, BLOOD (ROUTINE X 2)     Status: None   Collection Time    06/28/13  1:31 PM      Result Value Ref Range Status   Specimen Description BLOOD ARM RIGHT   Final   Special Requests BOTTLES DRAWN AEROBIC AND ANAEROBIC 5CC   Final   Culture  Setup Time     Final   Value: 06/28/2013 18:52     Performed at [facility]   Culture     Final   Value:        BLOOD CULTURE RECEIVED NO GROWTH TO DATE CULTURE WILL BE HELD FOR 5 DAYS BEFORE ISSUING A FINAL NEGATIVE REPORT     Performed at [facility]   Report Status PENDING   Incomplete  GRAM STAIN     Status: None   Collection Time    06/28/13  1:48 PM      Result Value Ref Range Status   Specimen Description FLUID PERITONEAL DIALYSATE   Final   Special Requests Normal   Final   Gram Stain     Final   Value: CYTOSPIN PREP     WBC PRESENT,BOTH PMN AND MONONUCLEAR      NO ORGANISMS SEEN   Report Status 06/28/2013 FINAL   Final  BODY FLUID CULTURE     Status: None   Collection Time    06/28/13  1:48 PM      Result Value Ref Range Status   Specimen Description PERITONEAL DIALYSATE FLUID   Final   Special Requests Normal   Final   Gram Stain     Final   Value: NO WBC SEEN     NO ORGANISMS SEEN     Performed at [facility]   Culture     Final   Value: NO GROWTH 3 DAYS     Performed at [facility]   Report Status 07/01/2013 FINAL   Final  CULTURE, BLOOD (ROUTINE X 2)     Status: None   Collection Time    06/28/13  2:05 PM      Result Value Ref Range Status   Specimen Description BLOOD ARM RIGHT   Final   Special Requests BOTTLES DRAWN AEROBIC AND ANAEROBIC 5CC   Final   Culture  Setup Time     Final  Value: 06/28/2013 18:52     Performed at [facility]   Culture     Final   Value:        BLOOD CULTURE RECEIVED NO GROWTH TO DATE CULTURE WILL BE HELD FOR 5 DAYS BEFORE ISSUING A FINAL NEGATIVE REPORT     Performed at [facility]   Report Status PENDING   Incomplete  MRSA PCR SCREENING     Status: None   Collection Time    06/28/13  9:11 PM      Result Value Ref Range Status   MRSA by PCR NEGATIVE  NEGATIVE Final   Comment:            The GeneXpert MRSA Assay (FDA     approved for NASAL specimens     only), is one component of a     comprehensive MRSA colonization     surveillance program. It is not     intended to diagnose MRSA     infection nor to guide or     monitor treatment for     MRSA infections.    Anti-infectives   Start     Dose/Rate Route Frequency Ordered Stop   06/30/13 1800  levofloxacin (LEVAQUIN) IVPB 500 mg     500 mg 100 mL/hr over 60 Minutes Intravenous Every 48 hours 06/28/13 1830     06/28/13 1845  rifampin (RIFADIN) capsule 300 mg     300 mg Oral Daily 06/28/13 1843     06/28/13 1740  rifampin (RIFADIN) capsule 300 mg  Status:  Discontinued     300 mg Oral Daily 06/28/13  1653 06/28/13 1844   06/28/13 1330  vancomycin (VANCOCIN) IVPB 1000 mg/200 mL premix     1,000 mg 200 mL/hr over 60 Minutes Intravenous  Once 06/28/13 1321 06/28/13 1512   06/28/13 1330  levofloxacin (LEVAQUIN) IVPB 500 mg     500 mg 100 mL/hr over 60 Minutes Intravenous  Once 06/28/13 1321 06/28/13 1616      Assessment: 80 yo PD patients admitted 06/28/2013 with 1 week of pain around peritoneal catheter. She received recent treatment for peritonitis with IV vancomycin and Fortaz.  Patient on PTA rifampin, which was continued inpatient.  Pharmacy consulted to dose vancomycin and levofloxacin.  Vancomycin level is 26 today and last dose of vancomycin was 5/14. Surgery saw today and decision noted to defer surgical treatment and to continue conservative management.   Rifampin PO continued from PTA 5/14 Vanc >> 5/14 Levofloxacin >>  5/17 VR = 23.3  5/14 BCx>> ngtd 5/14 peritoneal fluid>> NG final 5/14 peritoneal fluid gram stain >> neg  Goal of Therapy:  Resolution/control of infection Vancomycin trough: 15-20 mg/L  Plan:  - Continue Levaquin IV 500mg  q48h -No additional vancomycin needed today -Will plan to check another vancomycin level in 5 day (earlier if needed) -Will follow patient progress  Hildred Laser, Pharm D 07/02/2013 12:22 PM

## 2013-07-02 NOTE — Consult Note (Signed)
Valerie Yoder January 02, 1931 [id number]  This is a patient we have been asked to see to discuss the possibility of removing her PD catheter and replacing a new one due to repeated SBP infections.  She has had about 6 since November of 2014.  The patient has multiple medical problems and is chronically hypotensive, which is why they do not feel she would be a candidate for and tolerate HD.  Surgery, especially 2 general anesthetics, would be very difficult for the patient to tolerate along with the significant risk for her new catheter to get infected given all the bacterial peritonitis she has had.  I had a d/w the patient and her daughter in law about several different options.  1) do nothing and proceed with more comfort care. 2) proceed with surgical intervention to remove PD cath and then try to replace it in a couple of days, knowing full and well that this new catheter may become infected as well. 3) leave this catheter in place and proceed with abx therapy for this infection and succeeding ones.    Given the surgical risks, the patient and he daughter in law decided to defer surgical treatment and to continue conservative management with abx therapy for this and proceeding infections.  Defer further care to medicine and nephrology.  Discussed this with Dr. Thereasa Solo.  [name] 07/02/2013 11:12 AM

## 2013-07-02 NOTE — Progress Notes (Signed)
[facility] Progress Note   Subjective: NO complaints today  July 2013  gram + bacilli Oct 2013 cloudy fluid, ^TNC, no growth > cleared w abx Mar 2014 cloudy fluid, 2630 TNC, no growth > cleared w abx Sept 2014 Gram + bacilli Nov 2014 - in hospital    Feb 22, 2103  Gram + Bacilli (Bacillus sp.) Mar 23, 2013  Cloudy effluent, Rx fortaz and vanc  Jun 18, 2013  Gram + bacillus peritonitis, rx Tressie Ellis and vanc  Filed Vitals:   07/02/13 0553 07/02/13 0700 07/02/13 1100 07/02/13 1155  BP: 87/47 100/64  77/40  Pulse:    94  Temp:  98.5 F (36.9 C)  97.6 F (36.4 C)  TempSrc:  Oral  Oral  Resp: 20 28  18   Height:      Weight:   51.9 kg (114 lb 6.7 oz)   SpO2: 98% 99%  99%   Exam: Frail elderly female, pleasant in no distress No jvd Chest clear bilat RRR no mrg Abd soft, NT, mod distended, LLQ PD cath clean exit site Neuro is nf, Ox3  Home PD 2250 vol at night, 750 for day bag and pause at 2pm; 5 exchanges per day total Uses sliding scale with BP parameters for vol removal   Peritoneal fluid TNC = 8  (06/28/13) PD fluid cx negative  (06/28/13)   Assessment: 1 Abd pain- thought to be peritonitis but PD culture was negative and cell count was normal, she does not need AB for this right now 2 Hypotension- midodrine started, BP's still low 3 Hyponatremia- most likely due to vol excess 4 Anemia give aranesp today 100 ug 5 Afib on amio 6 HPTH on renagel 7 DNR pt has out-of-hospital DNR and d/w pt and daughter in law and they wish for inpatient DNR status  Plan- pull more fluid with PD today; pt anxious to go home and will d/w primary further about possibly going home tomorrow.  No plans for HD at this point, will keep pt on PD for now.  Limited options and guarded prognosis, pt and family are aware.      Kelly Splinter MD  pager [phone]    cell [phone]  07/02/2013, 1:55 PM     Recent Labs Lab 06/28/13 1315 06/30/13 0258 07/01/13 0330 07/02/13 0330  NA  133* 131* 126* 123*  K 4.4 3.8 4.4 4.0  CL 89* 91* 88* 83*  CO2 23 20 18* 17*  GLUCOSE 145* 94 95 157*  BUN 28* 38* 47* 52*  CREATININE 5.84* 7.87* 8.66* 9.27*  CALCIUM 9.5 8.2* 8.2* 8.3*  PHOS 5.0*  --  5.1*  --     Recent Labs Lab 06/28/13 1315 06/30/13 0258 07/01/13 0330 07/02/13 0330  AST 17 9  --  10  ALT 10 7  --  7  ALKPHOS 103 90  --  88  BILITOT 0.9 0.8  --  0.9  PROT 6.5 4.2*  --  4.8*  ALBUMIN 2.2* 1.4* 1.5* 1.5*    Recent Labs Lab 06/28/13 1315  06/30/13 0258 07/01/13 0330 07/02/13 0330  WBC 14.5*  < > 11.0* 17.1* 19.5*  NEUTROABS 10.5*  --   --   --   --   HGB 12.3  < > 8.6* 8.5* 8.3*  HCT 37.3  < > 26.2* 25.1* 24.2*  MCV 88.0  < > 88.8 85.7 84.6  PLT 227  < > 170 208 223  < > = values in this interval  not displayed. Marland Kitchen amiodarone  100 mg Oral Daily  . aspirin  81 mg Oral Daily  . clopidogrel  75 mg Oral Daily  . diclofenac sodium  2 g Topical QID  . docusate sodium  100 mg Oral BID  . heparin subcutaneous  5,000 Units Subcutaneous 3 times per day  . insulin aspart  0-9 Units Subcutaneous TID WC  . levofloxacin (LEVAQUIN) IV  500 mg Intravenous Q48H  . levothyroxine  75 mcg Oral QAC breakfast  . megestrol  400 mg Oral Daily  . midodrine  10 mg Oral TID WC  . mirtazapine  15 mg Oral QHS  . pantoprazole  40 mg Oral Daily  . rifampin  300 mg Oral Daily  . rOPINIRole  0.25 mg Oral QHS  . sevelamer carbonate  2,400 mg Oral TID WC  . simvastatin  20 mg Oral q1800     acetaminophen, gabapentin, HYDROcodone-acetaminophen, ondansetron (ZOFRAN) IV, ondansetron, polyethylene glycol, promethazine

## 2013-07-02 NOTE — Progress Notes (Signed)
OT Cancellation Note  Patient Details Name: Valerie Yoder MRN: [id number] DOB: 1930-05-21   Cancelled Treatment:    Reason Eval/Treat Not Completed: Patient not medically ready (MAP 51 - Pt declined not feeling well) OT educated patient on OT vs PT orders and reason for referral. Pt declining OT at this time due to not feeling well. OT to reattempt at later date and time.  Peri Maris Pager: [phone]  07/02/2013, 2:40 PM

## 2013-07-03 DIAGNOSIS — E119 Type 2 diabetes mellitus without complications: Secondary | ICD-10-CM

## 2013-07-03 DIAGNOSIS — N186 End stage renal disease: Secondary | ICD-10-CM

## 2013-07-03 DIAGNOSIS — I4891 Unspecified atrial fibrillation: Secondary | ICD-10-CM

## 2013-07-03 DIAGNOSIS — K659 Peritonitis, unspecified: Secondary | ICD-10-CM

## 2013-07-03 DIAGNOSIS — E039 Hypothyroidism, unspecified: Secondary | ICD-10-CM

## 2013-07-03 LAB — RENAL FUNCTION PANEL
Albumin: 1.4 g/dL — ABNORMAL LOW (ref 3.5–5.2)
BUN: 41 mg/dL — AB (ref 6–23)
CALCIUM: 8.4 mg/dL (ref 8.4–10.5)
CO2: 19 meq/L (ref 19–32)
CREATININE: 7.21 mg/dL — AB (ref 0.50–1.10)
Chloride: 89 mEq/L — ABNORMAL LOW (ref 96–112)
GFR calc Af Amer: 5 mL/min — ABNORMAL LOW (ref >90)
GFR calc non Af Amer: 5 mL/min — ABNORMAL LOW (ref >90)
GLUCOSE: 176 mg/dL — AB (ref 70–99)
Phosphorus: 4.7 mg/dL — ABNORMAL HIGH (ref 2.3–4.6)
Potassium: 3.2 mEq/L — ABNORMAL LOW (ref 3.7–5.3)
SODIUM: 130 meq/L — AB (ref 137–147)

## 2013-07-03 LAB — GLUCOSE, CAPILLARY
Glucose-Capillary: 126 mg/dL — ABNORMAL HIGH (ref 70–99)
Glucose-Capillary: 94 mg/dL (ref 70–99)

## 2013-07-03 LAB — CBC
HEMATOCRIT: 26.5 % — AB (ref 36.0–46.0)
HEMOGLOBIN: 8.8 g/dL — AB (ref 12.0–15.0)
MCH: 28.4 pg (ref 26.0–34.0)
MCHC: 33.2 g/dL (ref 30.0–36.0)
MCV: 85.5 fL (ref 78.0–100.0)
Platelets: 263 10*3/uL (ref 150–400)
RBC: 3.1 MIL/uL — ABNORMAL LOW (ref 3.87–5.11)
RDW: 18 % — ABNORMAL HIGH (ref 11.5–15.5)
WBC: 14 10*3/uL — ABNORMAL HIGH (ref 4.0–10.5)

## 2013-07-03 MED ORDER — PROMETHAZINE HCL 25 MG/ML IJ SOLN
12.5000 mg | Freq: Four times a day (QID) | INTRAMUSCULAR | Status: DC | PRN
  Administered 2013-07-03: 12.5 mg via INTRAVENOUS
  Filled 2013-07-03: qty 1

## 2013-07-03 MED ORDER — PROMETHAZINE HCL 25 MG/ML IJ SOLN
12.5000 mg | Freq: Four times a day (QID) | INTRAMUSCULAR | Status: DC | PRN
Start: 2013-07-03 — End: 2013-07-03

## 2013-07-03 MED ORDER — MIDODRINE HCL 10 MG PO TABS: 10.0000 mg | ORAL_TABLET | Freq: Three times a day (TID) | ORAL | Status: AC

## 2013-07-03 MED ORDER — SODIUM CHLORIDE 0.9 % IV BOLUS (SEPSIS)
250.0000 mL | Freq: Once | INTRAVENOUS | Status: AC
  Administered 2013-07-03: 250 mL via INTRAVENOUS

## 2013-07-03 MED ORDER — ONDANSETRON HCL 4 MG PO TABS: 4.0000 mg | ORAL_TABLET | Freq: Four times a day (QID) | ORAL | Status: AC | PRN

## 2013-07-03 MED ORDER — POTASSIUM CHLORIDE 10 MEQ/100ML IV SOLN
10.0000 meq | INTRAVENOUS | Status: AC
  Administered 2013-07-03 (×3): 10 meq via INTRAVENOUS
  Filled 2013-07-03 (×3): qty 100

## 2013-07-03 NOTE — Discharge Instructions (Signed)
Peritonitis Peritonitis is inflammation (the body's way of reacting to injury and/or infection) of the peritoneum. The peritoneum is the tissue that lines the abdomen and covers the internal organs. CAUSES   Conditions or injuries cause organs to leak stool, bacteria, fungi or chemicals (such as bile or other digestive juices) into the abdomen. When these substances come into contact with the peritoneum, they may cause irritation or infection.  Many conditions can cause peritonitis, including:  Pancreatitis.  Diverticulitis.  Appendicitis.  Ulcers.  Crohn's disease or ulcerative colitis.  Other infections inside the abdomen or pelvis.  Abdominal injury.  Injury to the stomach or esophagus (food pipe).  Cancer.  Liver disease.  Peritoneal dialysis (a procedure used to cleanse the blood when the kidneys are unable to do so).  Tuberculosis. SYMPTOMS  Symptoms of peritonitis may include:  Severe pain.  Abdominal swelling.  Fever and chills.  Nausea and vomiting.  Poor or no appetite.  Inability to pass gas or stool. DIAGNOSIS  Diagnosis begins with a complete physical exam. The abdomen may be tender or hard and board-like. Testing may include:  Blood tests.  Urinalysis  Stool analysis (if there is associated diarrhea).  Paracentesis: a procedure where a very thin needle may be used to take a sample of fluid from within the abdomen. This fluid can be examined for signs of infection.  X-ray.  Ultrasound.  CT scans. TREATMENT  This must be treated quickly, to avoid a severe, life-threatening infection that involves the whole body (sepsis). This must be done in the hospital. Treatment for peritonitis may include:  Antibiotics.  Surgery to remove infected fluid and tissue.  Surgery to treat conditions that cause peritonitis (such as an appendectomy for appendicitis). HOME CARE INSTRUCTIONS  Even after successful treatment in the hospital, treatment and  recovery will continue at home. It is important to:  Take medicines (such as antibiotics) exactly as prescribed by your caregiver.  If you were taking prescription medicines for other problems before you developed peritonitis, ask about when you should re-start these medicines.  Only take over-the-counter or prescription medicines for pain, discomfort or fever as directed by your caregiver.  Follow your prescribed diet. You might need a higher fiber diet to help avoid constipation.  Drink extra water.  Use a stool softener or laxative, if recommended.  Get extra rest. [place] IF:  You have increased abdominal pain or tenderness, or abdominal swelling.  You develop an unexplained oral temperature above 102 F (38.9 C).  You start to have the chills.  You have new problems with passing urine.  You develop chest pains and/or shortness of breath.  You experience nausea, vomiting or diarrhea.  You are unable to pass gas or stool.  You have had surgery and notice the healing incision has become hot, red, swollen or is leaking pus or blood. Document Released: 01/15/2008 Document Revised: 04/26/2011 Document Reviewed: 01/15/2008 [facility] Patient Information 2014 [place]. Nausea, Adult Nausea is the feeling that you have an upset stomach or have to vomit. Nausea by itself is not likely a serious concern, but it may be an early sign of more serious medical problems. As nausea gets worse, it can lead to vomiting. If vomiting develops, there is the risk of dehydration.  CAUSES   Viral infections.  Food poisoning.  Medicines.  Pregnancy.  Motion sickness.  Migraine headaches.  Emotional distress.  Severe pain from any source.  Alcohol intoxication. HOME CARE INSTRUCTIONS  Get plenty of  rest.  Ask your caregiver about specific rehydration instructions.  Eat small amounts of food and sip liquids more often.  Take all medicines as told by  your caregiver. SEEK MEDICAL CARE IF:  You have not improved after 2 days, or you get worse.  You have a headache. SEEK IMMEDIATE MEDICAL CARE IF:   You have a fever.  You faint.  You keep vomiting or have blood in your vomit.  You are extremely weak or dehydrated.  You have dark or bloody stools.  You have severe chest or abdominal pain. MAKE SURE YOU:  Understand these instructions.  Will watch your condition.  Will get help right away if you are not doing well or get worse. Document Released: 03/11/2004 Document Revised: 10/27/2011 Document Reviewed: 10/14/2010 [facility] Patient Information 2014 [place], [place].

## 2013-07-03 NOTE — Progress Notes (Signed)
Pt had one episode of emesis after taking 2200 medications. Medications included Liquid Colace, Remeron and Requip crushed. Emesis was bright pink with scattered white pieces of crushed pills. Post emesis pt verbalized feeling better.

## 2013-07-03 NOTE — Care Management Note (Signed)
    Page 1 of 1   07/03/2013     11:43:28 AM CARE MANAGEMENT NOTE 07/03/2013  Patient:  Valerie Yoder, Valerie Yoder   Account Number:  [id number]  Date Initiated:  06/29/2013  Documentation initiated by:  Elissa Hefty  Subjective/Objective Assessment:   adm to sdu w hypotension     Action/Plan:   lives w fam, pcp dr amy moon   Anticipated DC Date:  07/03/2013   Anticipated DC Plan:  HOME/SELF CARE         Choice offered to / List presented to:             Status of service:   Medicare Important Message given?  YES (If response is "NO", the following Medicare IM given date fields will be blank) Date Medicare IM given:  07/03/2013 Date Additional Medicare IM given:    Discharge Disposition:  HOME/SELF CARE  Per UR Regulation:  Reviewed for med. necessity/level of care/duration of stay  If discussed at [place] of Stay Meetings, dates discussed:   07/03/2013    Comments:  5/19 1142 debbie Wanita Derenzo rn,bsn brand name zofran not covered but generic covered at 6.00 per month.

## 2013-07-03 NOTE — Progress Notes (Signed)
On call [facility] paged for  BP 48/21. BP rechecked 78/21 (41). Schorr NP returned paged. BP and pt's PD discussed. Pt currently asymptomatic. NP made aware Midodrine TID ordered by Dr. Justin Mend. RN advised to call Nephrology. On call [facility] MD paged. Dr. Jonnie Finner returned paged.  Verbal order given for 250 NS Bolus once. RN will continued to assess pt condition and VS.

## 2013-07-03 NOTE — Progress Notes (Signed)
Occupational Therapy Discharge Patient Details Name: Valerie Yoder MRN: [id number] DOB: 28-Mar-1930 Today's Date: 07/03/2013 Time:  -     Patient discharged from OT services secondary to hospice care. .  Please see latest therapy progress note for current level of functioning and progress toward goals.    Progress and discharge plan discussed with patient and/or caregiver: Patient/Caregiver agrees with plan  GO     Peri Maris Pager: [phone]  07/03/2013, 10:35 AM

## 2013-07-03 NOTE — Progress Notes (Signed)
07/03/2013 Discharge instructions given and IV removed. Verbalized understanding. To car via w/c  Valerie Yoder

## 2013-07-03 NOTE — Progress Notes (Signed)
[facility] Progress Note   Subjective: BP's dropped into 40's last night, associated nausea. Alert, no other complaints  Outpatient PD  July 2013  gram + bacilli Oct 2013 cloudy fluid, ^TNC, no growth > cleared w abx Mar 2014 cloudy fluid, 2630 TNC, no growth > cleared w abx Sept 2014 Gram + bacilli Nov 2014 - in hospital    Feb 22, 2103  Gram + Bacilli (Bacillus sp.) Mar 23, 2013  Cloudy effluent, Rx fortaz and vanc  Jun 18, 2013  Gram + bacillus peritonitis, rx Tressie Ellis and vanc  Filed Vitals:   07/02/13 1900 07/02/13 2300 07/03/13 0300 07/03/13 0344  BP: 71/40 78/61 48/21  78/35  Pulse: 59 101 100   Temp: 97.9 F (36.6 C) 98.3 F (36.8 C) 97.7 F (36.5 C)   TempSrc: Oral Oral Oral   Resp: 18 26 18 18   Height:      Weight:   51.7 kg (113 lb 15.7 oz)   SpO2: 97% 96% 99%    Exam: Frail elderly female, pleasant in no distress No jvd Chest clear bilat RRR no mrg Abd soft, NT, mod distended, LLQ PD cath clean exit site Neuro is nf, Ox3  Home PD 2250 vol at night, 750 for day bag and pause at 2pm; 5 exchanges per day total Uses sliding scale with BP parameters for vol removal   Peritoneal fluid TNC = 8  (06/28/13) PD fluid cx negative  (06/28/13)   Assessment: 1 Abd pain- resolved, PD fluid cx negative and cell count normal 2 Hx recurrent peritonitis- not candidate for cath replacement 3 Hypotension- worse overnight w attempt to remove more volume 4 Hyponatremia 5 Anemia give aranesp today 100 ug 6 Afib on amio 7 HPTH on renagel 8 DNR pt request  Plan- stop dialysis, BP not tolerating. May need to consider hospice Rx    Kelly Splinter MD  pager [phone]    cell [phone]  07/03/2013, 5:12 AM     Recent Labs Lab 06/28/13 1315 06/30/13 0258 07/01/13 0330 07/02/13 0330  NA 133* 131* 126* 123*  K 4.4 3.8 4.4 4.0  CL 89* 91* 88* 83*  CO2 23 20 18* 17*  GLUCOSE 145* 94 95 157*  BUN 28* 38* 47* 52*  CREATININE 5.84* 7.87* 8.66* 9.27*  CALCIUM  9.5 8.2* 8.2* 8.3*  PHOS 5.0*  --  5.1*  --     Recent Labs Lab 06/28/13 1315 06/30/13 0258 07/01/13 0330 07/02/13 0330  AST 17 9  --  10  ALT 10 7  --  7  ALKPHOS 103 90  --  88  BILITOT 0.9 0.8  --  0.9  PROT 6.5 4.2*  --  4.8*  ALBUMIN 2.2* 1.4* 1.5* 1.5*    Recent Labs Lab 06/28/13 1315  06/30/13 0258 07/01/13 0330 07/02/13 0330  WBC 14.5*  < > 11.0* 17.1* 19.5*  NEUTROABS 10.5*  --   --   --   --   HGB 12.3  < > 8.6* 8.5* 8.3*  HCT 37.3  < > 26.2* 25.1* 24.2*  MCV 88.0  < > 88.8 85.7 84.6  PLT 227  < > 170 208 223  < > = values in this interval not displayed. Marland Kitchen amiodarone  100 mg Oral Daily  . aspirin  81 mg Oral Daily  . clopidogrel  75 mg Oral Daily  . darbepoetin (ARANESP) injection - NON-DIALYSIS  100 mcg Subcutaneous Q Mon-1800  . dialysis solution 2.5% low-MG/low-CA   Intraperitoneal  Once in dialysis  . dialysis solution 2.5% low-MG/low-CA   Intraperitoneal Once in dialysis  . diclofenac sodium  2 g Topical QID  . docusate  100 mg Oral BID  . heparin subcutaneous  5,000 Units Subcutaneous 3 times per day  . insulin aspart  0-9 Units Subcutaneous TID WC  . levothyroxine  75 mcg Oral QAC breakfast  . megestrol  400 mg Oral Daily  . midodrine  10 mg Oral TID WC  . mirtazapine  15 mg Oral QHS  . pantoprazole  40 mg Oral Daily  . rifampin  300 mg Oral Daily  . rOPINIRole  0.25 mg Oral QHS  . sevelamer carbonate  2,400 mg Oral TID WC  . simvastatin  20 mg Oral q1800   . dialysis solution 2.5% low-MG/low-CA    . heparin     acetaminophen, gabapentin, HYDROcodone-acetaminophen, ondansetron (ZOFRAN) IV, ondansetron, polyethylene glycol, promethazine

## 2013-07-03 NOTE — Progress Notes (Signed)
PT Cancellation Note  Patient Details Name: Valerie Yoder MRN: [id number] DOB: Jul 12, 1930   Cancelled Treatment:    Reason Eval/Treat Not Completed: Patient declined. States she is too weak to get up, and is going home today. Also states she feels PT and any follow-up PT at d/c would be a waste of time - "I don't have that much longer to live, honey". Pt educated on benefits of mobility, but still declined. Will continue to follow.      Jolyn Lent 07/03/2013, 10:02 AM  Jolyn Lent, PT, DPT Acute Rehabilitation Services Pager: [phone]

## 2013-07-03 NOTE — Progress Notes (Addendum)
BP up after fluid bolus. Pt wants to go home. She is an intractable PD cath infection which will undoubtedly come back; she is not a candidate for HD or catheter replacement due to her comorbidities and overall frailty and her prognosis is poor and time is undoubtedly limited. We are not doing anything here that could not be done at home and pt is now DNR, so we are recommending discharge home today and resume usual OP PD regimen. Her PD fluid here was negative so she does not need AB for that issue.  I have spoken with pts daughter and the patient and they are all aware of these issues and want to have Valerie Yoder home and are agreeable to discharge today.  Have d/w primary MD Dr Sherral Hammers.   Kelly Splinter MD (pgr) [phone]    [phone] 07/03/2013, 10:51 AM

## 2013-07-03 NOTE — Discharge Summary (Signed)
Physician Discharge Summary  Valerie Yoder [id number] DOB: [birth year] DOA: 06/28/2013  PCP: Laverna Peace, NP  Admit date: 06/28/2013 Discharge date: 07/03/2013  Time spent: 30 minutes  Recommendations for Outpatient Follow-up:  1. Follow up with Dr. Posey Pronto- arrange to be seen in no more than 1 week after discharge   Discharge Diagnoses:  Presumed recurrent peritonitis associated with PD cath End-stage renal disease on peritoneal dialysis Left chest pain consistent with abdominal source Chronic hypotension started on midodrine this admission Normocytic anemia of chronic kidney disease Grade 1 chronic diastolic heart failure compensated Coronary artery disease Atrial fibrillation rate controlled on Pacerone Hypothyroidism Diabetes mellitus Chronic hypokalemia Moderate malnutrition and chronic illness and recurrent nausea  Discharge Condition: Stable  Diet recommendation: As tolerated-renal diet if patient can tolerate noting she has persistent nausea with Nepro shakes if desires  Filed Weights   07/02/13 0400 07/02/13 1100 07/03/13 0300  Weight: 110 lb 14.4 oz (50.304 kg) 114 lb 6.7 oz (51.9 kg) 113 lb 15.7 oz (51.7 kg)    History of present illness:  80 year old female with end-stage renal disease currently on peritoneal dialysis, prior bouts of peritonitis, atrial fibrillation, heart failure, hyperlipidemia, and hypertension who presented to [facility] ED with one week duration of abdominal pain at the site of her peritoneal cath. This had been associated with nausea and 8-10 episodes of non bloody vomiting. Pt denied chest pain or shortness of breath, no fevers, chills. 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   In ED, pt noted to be hypotensive with SBP in 80's. Other vitals stable, T 97.4 F, HR 80 -90 bpm, oxygen saturation 100%.  Nephrology consulted.   Hospital Course:  Presumed recurring peritonitis associated w/ PD cath - ESRD PD dependent  See discussion per Nephrology - plan was to attempt to convert to HD, but low BP has precluded same - Nephrology discussed possible exchange of PD catheter w/ General Surgery and per their d/w the patient the patient has decided to not exchange PD cath for now. PD fluid cx returned w/ no growth so antibiotics stopped 5/18-nephrology had long discussion with patient and her power of attorney and plan at this time is to send home off antibiotics and monitor for any signs or symptoms that may indicate recurrence of infection with likely focus on comfort and eventual transition to hospice if patient does not have significant improvement after return to home  Low L chest pain  In f/u, hx now more suggestive of abdom source - pt appears to have near constant migratory abdom pain - will not pursue vascular w/u at this time - lipase was normal   Hypotension  a chronic issue (2014 D/C summary documented SBP 70-90) - midodrine added per Nephrology - BP slightly improved -patient did have episodic hypotension with systolic blood pressure down in the 40s and after peritoneal dialysis completed on 07/02/2013  Normocytic anemia  due to CKD - cont Epogen - no evidence of blood loss   Chronic Diastolic CHF - grade 1  Well compensated at present - management per dialysis noting has not tolerated dialysis due to recurrent hypotension including peritoneal dialysis  Coronary artery disease  continue Plavix and baby ASA - hasd inferolateral TW inversions/biphasic T waves new from 2014 EKG - a cardiogram w/o focal regional wall motion abnormality - would not presently be a candidate for anything other than medical  tx even if proven to have new lesions   Atrial fibrillation  rate controlled - cont Pacerone - recommend no BB or CCB due to hypotension   Hypothyroidism  TSH in normal range, but not at  goal of 1.0 - will not adjust dose in setting of atrial fibrillation- cont Synthroid -note patient is on amiodarone  Diabetes mellitus  HgbA1c 6.5 - follow CBG - was not on meds prior to admit   Moderate malnutrition in the context of chronic illness and recurrent nausea Dietary supplements as patient can tolerate noting she has recurrent nausea -discharge with prescription for Zofran  Procedures:  2-D echocardiogram - Left ventricle: The cavity size was normal. Systolic function was normal. The estimated ejection fraction was in the range of 55% to 60%. Wall motion was normal; there were no regional wall motion abnormalities. Doppler parameters are consistent with abnormal left ventricular relaxation (grade 1 diastolic dysfunction). - Aortic valve: Mild to moderate regurgitation. - Aortic root: The aortic root was normal in size. - Mitral valve: Calcified annulus. Mildly thickened leaflets . No regurgitation. - Left atrium: The atrium was normal in size. - Right ventricle: Systolic function was normal. - Tricuspid valve: Mild regurgitation. - Pulmonary arteries: Systolic pressure was within the normal range. PA peak pressure: 87mm Hg (S). - Pericardium, extracardiac: There was no pericardial effusion.   Consultations:  Nephrology  General surgery  Discharge Exam: Filed Vitals:   07/03/13 1229  BP: 81/43  Pulse:   Temp: 98.1 F (36.7 C)  Resp: 17   General: No acute respiratory distress  Lungs: Clear to auscultation bilaterally without wheezes or crackles  Cardiovascular: Regular rate and rhythm without murmur gallop or rub  Abdomen: Tender in LUW without guarding or rebound, soft, bowel sounds positive, no ascites, no appreciable mass  Musculoskeletal: No significant cyanosis, clubbing of bilateral lower extremities - no signif edema B LE     Discharge Instructions You were cared for by a hospitalist during your hospital stay. If you have any questions about  your discharge medications or the care you received while you were in the hospital after you are discharged, you can call the unit and asked to speak with the hospitalist on call if the hospitalist that took care of you is not available. Once you are discharged, your primary care physician will handle any further medical issues. Please note that NO REFILLS for any discharge medications will be authorized once you are discharged, as it is imperative that you return to your primary care physician (or establish a relationship with a primary care physician if you do not have one) for your aftercare needs so that they can reassess your need for medications and monitor your lab values.      Discharge Instructions   Body fluid culture with gram stain    Complete by:  As directed      Call MD for:  persistant nausea and vomiting    Complete by:  As directed      Call MD for:  severe uncontrolled pain    Complete by:  As directed      Call MD for:  temperature >100.4    Complete by:  As directed      Diet general    Complete by:  As directed   Renal but given nausea as tolerated- recommend Nepro shakes     Increase activity slowly    Complete by:  As directed  Medication List         acetaminophen 500 MG tablet  Commonly known as:  TYLENOL  Take 1,000 mg by mouth daily as needed for fever.     amiodarone 200 MG tablet  Commonly known as:  PACERONE  Take 100 mg by mouth daily.     aspirin 81 MG chewable tablet  Chew 81 mg by mouth daily.     clopidogrel 75 MG tablet  Commonly known as:  PLAVIX  Take 75 mg by mouth daily.     diclofenac sodium 1 % Gel  Commonly known as:  VOLTAREN  Apply 2 g topically 4 (four) times daily.     docusate sodium 100 MG capsule  Commonly known as:  COLACE  Take 100 mg by mouth 2 (two) times daily.     epoetin alfa 10000 UNIT/ML injection  Commonly known as:  EPOGEN,PROCRIT  Inject 10,000 Units into the skin once a week.     gabapentin  100 MG capsule  Commonly known as:  NEURONTIN  Take 100-200 mg by mouth at bedtime as needed (for pain).     HYDROcodone-acetaminophen 5-325 MG per tablet  Commonly known as:  NORCO/VICODIN  Take 1 tablet by mouth every 6 (six) hours as needed for moderate pain.     levothyroxine 75 MCG tablet  Commonly known as:  SYNTHROID, LEVOTHROID  Take 75 mcg by mouth daily before breakfast.     lovastatin 20 MG tablet  Commonly known as:  MEVACOR  Take 20 mg by mouth at bedtime.     megestrol 40 MG/ML suspension  Commonly known as:  MEGACE  Take 400 mg by mouth daily.     midodrine 10 MG tablet  Commonly known as:  PROAMATINE  Take 1 tablet (10 mg total) by mouth 3 (three) times daily with meals.     mirtazapine 15 MG tablet  Commonly known as:  REMERON  Take 15 mg by mouth at bedtime.     omeprazole 20 MG capsule  Commonly known as:  PRILOSEC  Take 20 mg by mouth 2 (two) times daily before a meal.     ondansetron 4 MG tablet  Commonly known as:  ZOFRAN  Take 1 tablet (4 mg total) by mouth every 6 (six) hours as needed for nausea.     polyethylene glycol packet  Commonly known as:  MIRALAX / GLYCOLAX  Take 17 g by mouth daily as needed for mild constipation.     potassium chloride SA 20 MEQ tablet  Commonly known as:  K-DUR,KLOR-CON  Take 20 mEq by mouth 2 (two) times daily.     RENVELA 800 MG tablet  Generic drug:  sevelamer carbonate  Take 2,400 mg by mouth 3 (three) times daily with meals.     rifampin 300 MG capsule  Commonly known as:  RIFADIN  Take 300 mg by mouth daily.     rOPINIRole 0.25 MG tablet  Commonly known as:  REQUIP  Take 0.25 mg by mouth at bedtime.       Allergies  Allergen Reactions  . Latex   . Other     Pneumonia shot   . Penicillins   . Sulfa Antibiotics    Follow-up Information   Schedule an appointment as soon as possible for a visit with PATEL,JAY K., MD. (arrange to be seen in no more than 1 weeks after discharge)    Specialty:   Nephrology   Contact information:   [place]  [place] [address] [phone]        The results of significant diagnostics from this hospitalization (including imaging, microbiology, ancillary and laboratory) are listed below for reference.    Significant Diagnostic Studies: Dg Chest Port 1 View  06/28/2013   CLINICAL DATA:  Weakness and hypotension  EXAM: PORTABLE CHEST - 1 VIEW  COMPARISON:  DG CHEST 1V PORT dated 01/05/2013  FINDINGS: The lungs are adequately inflated. There is no focal infiltrate. The cardiopericardial silhouette is normal in size. The pulmonary vascularity is not engorged. The mediastinum is normal in width. There is no pleural effusion. The observed portions of the bony thorax appear normal.  IMPRESSION: There is no evidence of CHF nor other active cardiopulmonary disease.   Electronically Signed   By: David  Martinique   On: 06/28/2013 14:11    Microbiology: Recent Results (from the past 240 hour(s))  CULTURE, BLOOD (ROUTINE X 2)     Status: None   Collection Time    06/28/13  1:31 PM      Result Value Ref Range Status   Specimen Description BLOOD ARM RIGHT   Final   Special Requests BOTTLES DRAWN AEROBIC AND ANAEROBIC 5CC   Final   Culture  Setup Time     Final   Value: 06/28/2013 18:52     Performed at [facility]   Culture     Final   Value:        BLOOD CULTURE RECEIVED NO GROWTH TO DATE CULTURE WILL BE HELD FOR 5 DAYS BEFORE ISSUING A FINAL NEGATIVE REPORT     Performed at [facility]   Report Status PENDING   Incomplete  GRAM STAIN     Status: None   Collection Time    06/28/13  1:48 PM      Result Value Ref Range Status   Specimen Description FLUID PERITONEAL DIALYSATE   Final   Special Requests Normal   Final   Gram Stain     Final   Value: CYTOSPIN PREP     WBC PRESENT,BOTH PMN AND MONONUCLEAR     NO ORGANISMS SEEN   Report Status 06/28/2013 FINAL   Final  BODY FLUID CULTURE     Status: None    Collection Time    06/28/13  1:48 PM      Result Value Ref Range Status   Specimen Description PERITONEAL DIALYSATE FLUID   Final   Special Requests Normal   Final   Gram Stain     Final   Value: NO WBC SEEN     NO ORGANISMS SEEN     Performed at [facility]   Culture     Final   Value: NO GROWTH 3 DAYS     Performed at [facility]   Report Status 07/01/2013 FINAL   Final  CULTURE, BLOOD (ROUTINE X 2)     Status: None   Collection Time    06/28/13  2:05 PM      Result Value Ref Range Status   Specimen Description BLOOD ARM RIGHT   Final   Special Requests BOTTLES DRAWN AEROBIC AND ANAEROBIC 5CC   Final   Culture  Setup Time     Final   Value: 06/28/2013 18:52     Performed at [facility]   Culture     Final   Value:        BLOOD CULTURE RECEIVED NO GROWTH TO DATE CULTURE WILL BE HELD  FOR 5 DAYS BEFORE ISSUING A FINAL NEGATIVE REPORT     Performed at [facility]   Report Status PENDING   Incomplete  MRSA PCR SCREENING     Status: None   Collection Time    06/28/13  9:11 PM      Result Value Ref Range Status   MRSA by PCR NEGATIVE  NEGATIVE Final   Comment:            The GeneXpert MRSA Assay (FDA     approved for NASAL specimens     only), is one component of a     comprehensive MRSA colonization     surveillance program. It is not     intended to diagnose MRSA     infection nor to guide or     monitor treatment for     MRSA infections.     Labs: Basic Metabolic Panel:  Recent Labs Lab 06/28/13 1315 06/30/13 0258 07/01/13 0330 07/02/13 0330 07/03/13 0400  NA 133* 131* 126* 123* 130*  K 4.4 3.8 4.4 4.0 3.2*  CL 89* 91* 88* 83* 89*  CO2 23 20 18* 17* 19  GLUCOSE 145* 94 95 157* 176*  BUN 28* 38* 47* 52* 41*  CREATININE 5.84* 7.87* 8.66* 9.27* 7.21*  CALCIUM 9.5 8.2* 8.2* 8.3* 8.4  MG 1.5  --   --   --   --   PHOS 5.0*  --  5.1*  --  4.7*   Liver Function Tests:  Recent Labs Lab 06/28/13 1315 06/30/13 0258  07/01/13 0330 07/02/13 0330 07/03/13 0400  AST 17 9  --  10  --   ALT 10 7  --  7  --   ALKPHOS 103 90  --  88  --   BILITOT 0.9 0.8  --  0.9  --   PROT 6.5 4.2*  --  4.8*  --   ALBUMIN 2.2* 1.4* 1.5* 1.5* 1.4*    Recent Labs Lab 07/02/13 0330  LIPASE 11   No results found for this basename: AMMONIA,  in the last 168 hours CBC:  Recent Labs Lab 06/28/13 1315 06/29/13 0300 06/30/13 0258 07/01/13 0330 07/02/13 0330 07/03/13 0400  WBC 14.5* 11.4* 11.0* 17.1* 19.5* 14.0*  NEUTROABS 10.5*  --   --   --   --   --   HGB 12.3 9.6* 8.6* 8.5* 8.3* 8.8*  HCT 37.3 28.6* 26.2* 25.1* 24.2* 26.5*  MCV 88.0 88.5 88.8 85.7 84.6 85.5  PLT 227 160 170 208 223 263   Cardiac Enzymes:  Recent Labs Lab 06/28/13 1315  TROPONINI <0.30   BNP: BNP (last 3 results) No results found for this basename: PROBNP,  in the last 8760 hours CBG:  Recent Labs Lab 07/02/13 0807 07/02/13 1159 07/02/13 1735 07/02/13 2116 07/03/13 0800  GLUCAP 202* 189* 160* 169* 126*       Signed:  Samella Parr ANP [facility] 07/03/2013, 12:52 PM   Examining patient and discussed A./P. With ANP Ebony Hail and agree with her plan. Plan discussed with patient and all questions were answered Greater than 35 minutes were used for direct patient care

## 2013-07-04 LAB — CULTURE, BLOOD (ROUTINE X 2)
Culture: NO GROWTH
Culture: NO GROWTH

## 2013-07-05 NOTE — ED Provider Notes (Signed)
I saw and evaluated the patient, reviewed the resident's note and I agree with the findings and plan.   EKG Interpretation   Date/Time:  Thursday Jun 28 2013 13:21:15 EDT Ventricular Rate:  102 PR Interval:  169 QRS Duration: 91 QT Interval:  375 QTC Calculation: 488 R Axis:   91 Text Interpretation:  Sinus tachycardia Multiple premature complexes, vent   Right axis deviation Borderline T abnormalities, diffuse leads ED  PHYSICIAN INTERPRETATION AVAILABLE IN [facility] Confirmed by TEST,  Record ([id number]) on 06/30/2013 10:02:42 AM      Patient face-to-face. Psoas patient upon her arrival with Dr.Kunz.  Relatively benign exam. 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 Agree with his care and disposition.  Tanna Furry, MD 07/05/13 [phone]

## 2013-08-15 ENCOUNTER — Inpatient Hospital Stay (HOSPITAL_COMMUNITY)
Admission: EM | Admit: 2013-08-15 | Discharge: 2013-08-20 | DRG: 299 | Disposition: A | Discharge status: Home Health | Payer: Medicare Other | Attending: Internal Medicine | Admitting: Internal Medicine

## 2013-08-15 ENCOUNTER — Encounter (HOSPITAL_COMMUNITY): Payer: Self-pay | Admitting: Emergency Medicine

## 2013-08-15 DIAGNOSIS — I480 Paroxysmal atrial fibrillation: Secondary | ICD-10-CM

## 2013-08-15 DIAGNOSIS — I4891 Unspecified atrial fibrillation: Secondary | ICD-10-CM

## 2013-08-15 DIAGNOSIS — I5189 Other ill-defined heart diseases: Secondary | ICD-10-CM

## 2013-08-15 DIAGNOSIS — IMO0002 Reserved for concepts with insufficient information to code with codable children: Secondary | ICD-10-CM

## 2013-08-15 DIAGNOSIS — I82402 Acute embolism and thrombosis of unspecified deep veins of left lower extremity: Secondary | ICD-10-CM

## 2013-08-15 DIAGNOSIS — Z515 Encounter for palliative care: Secondary | ICD-10-CM

## 2013-08-15 DIAGNOSIS — I803 Phlebitis and thrombophlebitis of lower extremities, unspecified: Secondary | ICD-10-CM

## 2013-08-15 DIAGNOSIS — R5381 Other malaise: Secondary | ICD-10-CM | POA: Diagnosis present

## 2013-08-15 DIAGNOSIS — I2584 Coronary atherosclerosis due to calcified coronary lesion: Secondary | ICD-10-CM

## 2013-08-15 DIAGNOSIS — E785 Hyperlipidemia, unspecified: Secondary | ICD-10-CM | POA: Diagnosis present

## 2013-08-15 DIAGNOSIS — I482 Chronic atrial fibrillation, unspecified: Secondary | ICD-10-CM

## 2013-08-15 DIAGNOSIS — Z66 Do not resuscitate: Secondary | ICD-10-CM | POA: Diagnosis present

## 2013-08-15 DIAGNOSIS — Z7982 Long term (current) use of aspirin: Secondary | ICD-10-CM

## 2013-08-15 DIAGNOSIS — N186 End stage renal disease: Secondary | ICD-10-CM | POA: Diagnosis present

## 2013-08-15 DIAGNOSIS — E119 Type 2 diabetes mellitus without complications: Secondary | ICD-10-CM | POA: Diagnosis present

## 2013-08-15 DIAGNOSIS — Z8249 Family history of ischemic heart disease and other diseases of the circulatory system: Secondary | ICD-10-CM

## 2013-08-15 DIAGNOSIS — K659 Peritonitis, unspecified: Secondary | ICD-10-CM

## 2013-08-15 DIAGNOSIS — I82409 Acute embolism and thrombosis of unspecified deep veins of unspecified lower extremity: Principal | ICD-10-CM | POA: Diagnosis present

## 2013-08-15 DIAGNOSIS — I509 Heart failure, unspecified: Secondary | ICD-10-CM | POA: Diagnosis present

## 2013-08-15 DIAGNOSIS — M129 Arthropathy, unspecified: Secondary | ICD-10-CM | POA: Diagnosis present

## 2013-08-15 DIAGNOSIS — I9589 Other hypotension: Secondary | ICD-10-CM | POA: Diagnosis present

## 2013-08-15 DIAGNOSIS — I959 Hypotension, unspecified: Secondary | ICD-10-CM

## 2013-08-15 DIAGNOSIS — I2510 Atherosclerotic heart disease of native coronary artery without angina pectoris: Secondary | ICD-10-CM | POA: Diagnosis present

## 2013-08-15 DIAGNOSIS — E43 Unspecified severe protein-calorie malnutrition: Secondary | ICD-10-CM

## 2013-08-15 DIAGNOSIS — I950 Idiopathic hypotension: Secondary | ICD-10-CM

## 2013-08-15 DIAGNOSIS — Z992 Dependence on renal dialysis: Secondary | ICD-10-CM

## 2013-08-15 DIAGNOSIS — I120 Hypertensive chronic kidney disease with stage 5 chronic kidney disease or end stage renal disease: Secondary | ICD-10-CM | POA: Diagnosis present

## 2013-08-15 DIAGNOSIS — Z993 Dependence on wheelchair: Secondary | ICD-10-CM

## 2013-08-15 DIAGNOSIS — E876 Hypokalemia: Secondary | ICD-10-CM | POA: Diagnosis present

## 2013-08-15 DIAGNOSIS — D649 Anemia, unspecified: Secondary | ICD-10-CM | POA: Diagnosis present

## 2013-08-15 DIAGNOSIS — N2581 Secondary hyperparathyroidism of renal origin: Secondary | ICD-10-CM | POA: Diagnosis present

## 2013-08-15 MED ORDER — SODIUM CHLORIDE 0.9 % IV SOLN
INTRAVENOUS | Status: DC
  Administered 2013-08-15: 20:00:00 via INTRAVENOUS

## 2013-08-15 MED ORDER — FENTANYL CITRATE 0.05 MG/ML IJ SOLN: 12.5000 ug | INTRAMUSCULAR | Status: DC | PRN

## 2013-08-15 MED ORDER — HYDROCODONE-ACETAMINOPHEN 7.5-325 MG PO TABS
1.0000 | ORAL_TABLET | Freq: Four times a day (QID) | ORAL | Status: DC | PRN
  Administered 2013-08-15 – 2013-08-19 (×3): 1 via ORAL
  Filled 2013-08-15 (×3): qty 1

## 2013-08-15 MED ORDER — HEPARIN (PORCINE) IN NACL 100-0.45 UNIT/ML-% IJ SOLN
700.0000 [IU]/h | INTRAMUSCULAR | Status: DC
  Administered 2013-08-15: 500 [IU]/h via INTRAVENOUS
  Filled 2013-08-15: qty 250

## 2013-08-15 NOTE — H&P (Signed)
PCP:   Laverna Peace, NP    Chief Complaint:  Left leg pain  HPI: Valerie Yoder is a 80 y.o. female   has a past medical history of Hypertension; Diabetes mellitus; Renal failure; Mitral valve prolapse; Hyperlipidemia; CHF (congestive heart failure); Atrial fibrillation; Arthritis; Cancer (07/02/11); and ESRD on peritoneal dialysis (07/15/2011).   Presented with  Patietn with complex medical  Hx including ESRD on PD with recurrent peritonitis currently undergoing treatment with vancomycin and Fortaz. Patient per family has had poor appetite and severe debility. Inability to ambulate. 4 days ago she developed left leg pain. She presented to [facility] and eventually was diagnosed with large DVT. She was transferred to [facility] ER to be seen by vasilar surgery for possible thrombectomy. The patient has chronic debility, and hypotension and her blood pressures running in the 70s- 80's. Plus to be not a candidate for operative intervention. Vascular surgery have seen the patient and recommended admission to medicine on heparin followed by by mouth anticoagulation. Patient's family is concerned that she has trouble swallowing pills due ongoing nausea and vomiting which they attributed to hair antibiotics.  Patient already has hospice set up she is DO NOT RESUSCITATE . Overall she has poor prognosis but discussed with patient and her family at this point she wishes to continue her peritoneal dialysis, antibiotics, and medications for blood clot as well as pain medications if possible. Of note patient has been given fentanyl and has became even more profoundly hypotensive. In ER she was given IV fluids 125 ml/h with some improvement in blood pressure but her abdomen became somewhat distended.  Family states her last dose of vancomycin was on Saturday 6/27 and 2 days ago her vancomycin level has been checked and was found to be 11. Hospitalist was called for admission for DVT management  Of note  patient has history of atrial fibrillation and and used to be on Coumadin but there was difficult to control which point she'll switch to Plavix.  Review of Systems:    Pertinent positives include: nausea, vomiting, fatigue, poor appetite  Constitutional:  No weight loss, night sweats, Fevers, chills, weight loss  HEENT:  No headaches, Difficulty swallowing,Tooth/dental problems,Sore throat,  No sneezing, itching, ear ache, nasal congestion, post nasal drip,  Cardio-vascular:  No chest pain, Orthopnea, PND, anasarca, dizziness, palpitations.no Bilateral lower extremity swelling  GI:  No heartburn, indigestion, abdominal pain,  diarrhea, change in bowel habits, loss of appetite, melena, blood in stool, hematemesis Resp:  no shortness of breath at rest. No dyspnea on exertion, No excess mucus, no productive cough, No non-productive cough, No coughing up of blood.No change in color of mucus.No wheezing. Skin:  no rash or lesions. No jaundice GU:  no dysuria, change in color of urine, no urgency or frequency. No straining to urinate.  No flank pain.  Musculoskeletal:  No joint pain or no joint swelling. No decreased range of motion. No back pain.  Psych:  No change in mood or affect. No depression or anxiety. No memory loss.  Neuro: no localizing neurological complaints, no tingling, no weakness, no double vision, no gait abnormality, no slurred speech, no confusion  Otherwise ROS are negative except for above, 10 systems were reviewed  Past Medical History: Past Medical History  Diagnosis Date  . Hypertension   . Diabetes mellitus   . Renal failure   . Mitral valve prolapse   . Hyperlipidemia   . CHF (congestive heart failure)   . Atrial fibrillation   .  Arthritis   . 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 ESRD on peritoneal dialysis 07/15/2011    Started peritoneal dialysis in October 2011 and follows with the PD clinic in [place]. Has had recurrent problems with infections starting  around 2012 or 2013.      Past Surgical History  Procedure Laterality Date  . Nephrectomy      right kidney  . Abdominal hysterectomy    . Appendectomy    . Tonsillectomy    . Av fistula placement  2010    left brachiocephalic AVF  . Peritoneal catheter insertion  2011     Medications: Prior to Admission medications   Medication Sig Start Date End Date Taking? Authorizing Provider  acetaminophen (TYLENOL) 500 MG tablet Take 1,000 mg by mouth daily as needed for fever.   Yes Historical Provider, MD  amiodarone (PACERONE) 200 MG tablet Take 100 mg by mouth daily.    Yes Historical Provider, MD  aspirin 81 MG chewable tablet Chew 81 mg by mouth daily.   Yes Historical Provider, MD  cefTAZidime 1 g in dextrose 5 % 50 mL Inject 1 g into the vein every 8 (eight) hours.   Yes Historical Provider, MD  clopidogrel (PLAVIX) 75 MG tablet Take 75 mg by mouth daily.   Yes Historical Provider, MD  diclofenac sodium (VOLTAREN) 1 % GEL Apply 2 g topically 4 (four) times daily.   Yes Historical Provider, MD  docusate sodium (COLACE) 100 MG capsule Take 100 mg by mouth 2 (two) times daily.   Yes Historical Provider, MD  doxycycline (PERIOSTAT) 20 MG tablet Take 20 mg by mouth daily as needed. For pain per daughter   Yes Historical Provider, MD  epoetin alfa (EPOGEN,PROCRIT) 47425 UNIT/ML injection Inject 10,000 Units into the skin once a week.   Yes Historical Provider, MD  gabapentin (NEURONTIN) 100 MG capsule Take 100-200 mg by mouth at bedtime as needed (for pain).    Yes Historical Provider, MD  levothyroxine (SYNTHROID, LEVOTHROID) 75 MCG tablet Take 75 mcg by mouth daily before breakfast.   Yes Historical Provider, MD  lovastatin (MEVACOR) 20 MG tablet Take 20 mg by mouth daily.    Yes Historical Provider, MD  megestrol (MEGACE) 40 MG/ML suspension Take 400 mg by mouth daily.   Yes Historical Provider, MD  midodrine (PROAMATINE) 10 MG tablet Take 1 tablet (10 mg total) by mouth 3 (three) times  daily with meals. 07/03/13  Yes Samella Parr, NP  mirtazapine (REMERON) 15 MG tablet Take 15 mg by mouth at bedtime.   Yes Historical Provider, MD  omeprazole (PRILOSEC) 20 MG capsule Take 20 mg by mouth 2 (two) times daily before a meal.   Yes Historical Provider, MD  ondansetron (ZOFRAN) 4 MG tablet Take 1 tablet (4 mg total) by mouth every 6 (six) hours as needed for nausea. 07/03/13  Yes Samella Parr, NP  polyethylene glycol (MIRALAX / GLYCOLAX) packet Take 17 g by mouth daily as needed for mild constipation.   Yes Historical Provider, MD  potassium chloride SA (K-DUR,KLOR-CON) 20 MEQ tablet Take 20 mEq by mouth 2 (two) times daily.   Yes Historical Provider, MD  rifampin (RIFADIN) 300 MG capsule Take 300 mg by mouth daily.   Yes Historical Provider, MD  rOPINIRole (REQUIP) 0.25 MG tablet Take 0.25 mg by mouth at bedtime.    Yes Historical Provider, MD  sevelamer carbonate (RENVELA) 800 MG tablet Take 2,400 mg by mouth 3 (three) times daily  with meals.   Yes Historical Provider, MD  vancomycin 1,000 mg in sodium chloride 0.9 % 250 mL Inject 1,000 mg into the vein every 12 (twelve) hours.   Yes Historical Provider, MD    Allergies:   Allergies  Allergen Reactions  . Latex     Made me sick   . Other     Pneumonia shot   . Penicillins     Swelling   . Pneumococcal Vaccines     Arms swelling   . Sulfa Antibiotics     Sick on my stomach     Social History:  wheelchair bound Lives at home alone With family   reports that she has never smoked. She does not have any smokeless tobacco history on file. She reports that she does not drink alcohol or use illicit drugs.    Family History: family history includes CAD in her brother; Diabetes in her father.    Physical Exam: Patient Vitals for the past 24 hrs:  BP Temp Temp src Pulse Resp SpO2 Height Weight  08/15/13 2215 81/57 mmHg - - 81 22 100 % - -  08/15/13 2145 66/37 mmHg - - 73 11 100 % - -  08/15/13 2130 61/44 mmHg - -  77 12 98 % - -  08/15/13 2045 78/51 mmHg - - 79 21 100 % - -  08/15/13 2015 93/52 mmHg - - 75 13 95 % - -  08/15/13 2000 94/62 mmHg - - 79 16 100 % - -  08/15/13 1947 - - - - - - 5\' 2"  (1.575 m) 49.896 kg (110 lb)  08/15/13 1930 89/59 mmHg - - - 18 - - -  08/15/13 1923 - - - - - 96 % - -  08/15/13 1915 59/39 mmHg - - - 20 - - -  08/15/13 1914 66/35 mmHg 97.9 F (36.6 C) Oral 86 19 96 % - -    1. General:  in No Acute distress 2. Psychological: Alert and Oriented 3. Head/ENT:  Dry Mucous Membranes                          Head Non traumatic, neck supple                          Normal  Dentition 4. SKIN: decreased Skin turgor,  Skin clean Dry and intact no rash 5. Heart: Regular rate and rhythm no Murmur, Rub or gallop 6. Lungs: Clear to auscultation bilaterally, no wheezes or crackles   7. Abdomen: Soft, non-tender, slightly distended 8. Lower extremities: no clubbing, cyanosis, left leg more severely swollen than the right 9. Neurologically Grossly intact, moving all 4 extremities equally 10. MSK: Normal range of motion  body mass index is 20.11 kg/(m^2).   Labs on Admission:  [facility] WBC 16.6 hemoglobin 11 platelets 226 sodium 1:30 potassium 2.4 bicarbonate 19 BUN 14 creatinine 4.4 blood sugar 136 Lab Results  Component Value Date   HGBA1C 6.5* 06/29/2013    Estimated Creatinine Clearance: 4.8 ml/min (by C-G formula based on Cr of 7.21). ABG No results found for this basename: phart, pco2, po2, hco3, tco2, acidbasedef, o2sat     Lab Results  Component Value Date   DDIMER 1.05* 07/01/2013     Other results:   BNP (last 3 results) No results found for this basename: PROBNP,  in the last 8760 hours  Filed Weights   08/15/13  1947  Weight: 49.896 kg (110 lb)   Cultures:    Component Value Date/Time   SDES BLOOD ARM RIGHT 06/28/2013 1405   SPECREQUEST BOTTLES DRAWN AEROBIC AND ANAEROBIC 5CC 06/28/2013 1405   CULT  Value: NO GROWTH 5 DAYS Performed at  [facility] 06/28/2013 1405   REPTSTATUS 07/04/2013 FINAL 06/28/2013 1405    Radiological Exams on Admission: No results found.  Oval Linsey last low extremity Doppler showed deep vein is thrombosed extending into iliac vein involving the femoral,  Popliteal, profunda femoral, posterior Tibial and peroneal veins well adherent to the wall  Chart has been reviewed  Assessment/Plan   80 year old female with past medical history including end-stage renal disease on peritoneal dialysis, recurrent peritonitis treated with Tressie Ellis intraperitoneally as well as vancomycin once a week. Transferred to [facility] with DVT has history of hypertension with systolic blood pressures ranging between 70-80 range  Present on Admission:  . DVT (deep venous thrombosis)  - continue heparin drip the right for Coumadin at this point we'll need to discuss with pharmacy and family if patient would be a good candidate for newer  anticoagulants  . Atrial fibrillation - continue amiodarone watch in telemetry  . CAD (coronary artery disease) - stable currently on heparin no chest pain  . Diabetes mellitus - patient has history of diabetes currently diet controlled  . Diastolic dysfunction-grade 1  - avoid fluid overload  . ESRD on peritoneal dialysis - spoke to nephrology who will write for her peritoneal dialysis orders in the morning. Stop IV fluids and watch for evidence of fluid overload. Currently not in respiratory distress check potassium  . Hypotension - chronic will continue midodrin . Protein-calorie malnutrition, severe - obtain prealbumin and nutrition consult  . Peritonitis - will check vancomycin level. Spoke to nephrology who will write for her Tressie Ellis in the morning. We'll check a vancomycin level and see if she needs to be retreated     Prophylaxis:  on Coumadin and heparin , Protonix  CODE STATUS: Overall poor prognosis patient's family does not wish overaggressive care but would like to continue  the course DNR/DNI  Other plan as per orders.  I have spent a total of 80 min on this admission extratemporal scan taken to discuss care with nephrology aches as well as extensive discussion with family and review of medical record   [place] 08/15/2013, 10:28 PM  [facility]  Pager [phone]   If 7AM-7PM, please contact the day team taking care of the patient  [email or web address]  Password [email or web address]

## 2013-08-15 NOTE — Consult Note (Signed)
[facility] [place] HISTORY AND PHYSICAL  Reason for consult: DVT rule out phlegmasia Requesting: [facility] ER History of Present Illness:  Patient is a 80 y.o. year old female who presents for evaluation of left leg DVT.  Pt began to have pain in her left leg 3 days ago then progressive swelling.  She was found to have extensive DVT on work up at [facility] with concern for phlegmasia and transferred to [facility].  Pt has baseline numbness and tingling in feet from neuropathy. She has left leg pain diffusely.  She recently had illness with nausea and vomiting and was admitted for IV hydration and electrolyte imbalance.  She is a chronic peritoneal dialysis patient..  Other medical problems include chronic hypotension, diabetes, CHF, hyperlipidemia, atrial fibrillation.  She was on coumadin in the past but per pt this was stopped due to difficulty regulating dose.  She has not had any significant bleeding or other contraindication to coumadin.  Past Medical History  Diagnosis Date  . Hypertension   . Diabetes mellitus   . Renal failure   . Mitral valve prolapse   . Hyperlipidemia   . CHF (congestive heart failure)   . Atrial fibrillation   . Arthritis   . 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 ESRD on peritoneal dialysis 07/15/2011    Started peritoneal dialysis in October 2011 and follows with the PD clinic in [place]. Has had recurrent problems with infections starting around 2012 or 2013.       Past Surgical History  Procedure Laterality Date  . Nephrectomy      right kidney  . Abdominal hysterectomy    . Appendectomy    . Tonsillectomy    . Av fistula placement  2010    left brachiocephalic AVF  . Peritoneal catheter insertion  2011    Social History History  Substance Use Topics  . Smoking status: Never Smoker   . Smokeless tobacco: Not on file  . Alcohol Use: No    Family History History reviewed. No pertinent family  history.  Allergies  Allergies  Allergen Reactions  . Latex   . Other     Pneumonia shot   . Penicillins   . Pneumococcal Vaccines   . Sulfa Antibiotics      Current Facility-Administered Medications  Medication Dose Route Frequency Provider Last Rate Last Dose  . 0.9 %  sodium chloride infusion   Intravenous Continuous Carmin Muskrat, MD      . fentaNYL (SUBLIMAZE) injection 12.5 mcg  12.5 mcg Intravenous Q1H PRN Carmin Muskrat, MD       Current Outpatient Prescriptions  Medication Sig Dispense Refill  . acetaminophen (TYLENOL) 500 MG tablet Take 1,000 mg by mouth daily as needed for fever.      Marland Kitchen amiodarone (PACERONE) 200 MG tablet Take 100 mg by mouth daily.       Marland Kitchen aspirin 81 MG chewable tablet Chew 81 mg by mouth daily.      . clopidogrel (PLAVIX) 75 MG tablet Take 75 mg by mouth daily.      . diclofenac sodium (VOLTAREN) 1 % GEL Apply 2 g topically 4 (four) times daily.      Marland Kitchen docusate sodium (COLACE) 100 MG capsule Take 100 mg by mouth 2 (two) times daily.      Marland Kitchen epoetin alfa (EPOGEN,PROCRIT) 58527 UNIT/ML injection Inject 10,000 Units into the skin once a week.      . gabapentin (NEURONTIN) 100 MG  capsule Take 100-200 mg by mouth at bedtime as needed (for pain).       Marland Kitchen HYDROcodone-acetaminophen (NORCO/VICODIN) 5-325 MG per tablet Take 1 tablet by mouth every 6 (six) hours as needed for moderate pain.      Marland Kitchen levothyroxine (SYNTHROID, LEVOTHROID) 75 MCG tablet Take 75 mcg by mouth daily before breakfast.      . lovastatin (MEVACOR) 20 MG tablet Take 20 mg by mouth at bedtime.       . megestrol (MEGACE) 40 MG/ML suspension Take 400 mg by mouth daily.      . midodrine (PROAMATINE) 10 MG tablet Take 1 tablet (10 mg total) by mouth 3 (three) times daily with meals.  90 tablet  0  . mirtazapine (REMERON) 15 MG tablet Take 15 mg by mouth at bedtime.      Marland Kitchen omeprazole (PRILOSEC) 20 MG capsule Take 20 mg by mouth 2 (two) times daily before a meal.      . ondansetron (ZOFRAN) 4  MG tablet Take 1 tablet (4 mg total) by mouth every 6 (six) hours as needed for nausea.  30 tablet  0  . polyethylene glycol (MIRALAX / GLYCOLAX) packet Take 17 g by mouth daily as needed for mild constipation.      . potassium chloride SA (K-DUR,KLOR-CON) 20 MEQ tablet Take 20 mEq by mouth 2 (two) times daily.      . rifampin (RIFADIN) 300 MG capsule Take 300 mg by mouth daily.      Marland Kitchen rOPINIRole (REQUIP) 0.25 MG tablet Take 0.25 mg by mouth at bedtime.       . sevelamer carbonate (RENVELA) 800 MG tablet Take 2,400 mg by mouth 3 (three) times daily with meals.        ROS:   General:  + weight loss, Fever, chills  HEENT: No recent headaches, no nasal bleeding, no visual changes, no sore throat  Neurologic: No dizziness, blackouts, seizures. No recent symptoms of stroke or mini- stroke. No recent episodes of slurred speech, or temporary blindness.  Cardiac: No recent episodes of chest pain/pressure, no shortness of breath at rest.  No shortness of breath with exertion.  + history of atrial fibrillation or irregular heartbeat  Vascular: No history of rest pain in feet.  No history of claudication.  No history of non-healing ulcer, No prior history of DVT   Pulmonary: No home oxygen, no productive cough, no hemoptysis,  No asthma or wheezing  Musculoskeletal:  [ ]  Arthritis, [ ]  Low back pain,  [ ]  Joint pain  Hematologic:No history of hypercoagulable state.  No history of easy bleeding.    Gastrointestinal: No hematochezia or melena,  No gastroesophageal reflux, no trouble swallowing  Urinary: [x ] chronic Kidney disease, [ ]  on HD - [ ]  MWF or [ ]  TTHS, [ ]  Burning with urination, [ ]  Frequent urination, [ ]  Difficulty urinating;   Skin: No rashes  Psychological: No history of anxiety,  No history of depression   Physical Examination  Filed Vitals:   08/15/13 1914 08/15/13 1923  BP: 66/35   Pulse: 86   Temp: 97.9 F (36.6 C)   TempSrc: Oral   Resp: 19   SpO2: 96% 96%     There is no weight on file to calculate BMI.  General:  Alert and oriented, no acute distress HEENT: Normal Neck: No JVD Pulmonary: Clear to auscultation bilaterally Cardiac: Irregular rhythm Abdomen: Soft, non-tender, non-distended, PD catheter Skin: No rash, left leg with bluish hue  diffusely Extremity Pulses:  2+ radial, brachial, femoral, absent dorsalis pedis, posterior tibial pulses bilaterally, doppler signals in feet bilaterally Musculoskeletal: No deformity has edema left leg nearly twice size of right Neurologic: Upper and lower extremity motor 5/5 and symmetric, decreased symmetric loss of sensation in feet  DATA:  Duplex reviewed, left iliofemoral DVT   Assessment/Planning: Acute DVT left leg, no phlegmasia Would not recommend lysis due to risk of intracranial bleed in pts this age Heparin followed by oral anticoagulation Needs Renal consult to manage her peritoneal dialysis  Ruta Hinds, MD [facility] [place]: [phone] Pager: [phone]

## 2013-08-15 NOTE — ED Notes (Signed)
Admitting at bedside 

## 2013-08-15 NOTE — ED Notes (Signed)
Pt also received fentanyl pta.

## 2013-08-15 NOTE — ED Notes (Signed)
EMS states: "She has a blood clot behind her left knee, she had one bag of potassium, heparin at 13ml/hr, 800 ml NS infused PTA. Hypotensive 70's/50's.

## 2013-08-15 NOTE — ED Notes (Signed)
[phone] Valerie Yoder [phone] Valerie Yoder and dan home phone (pt contacts)

## 2013-08-15 NOTE — ED Notes (Signed)
Pt placed on monitor upon arrival to room. Pt monitored by 12 lead, blood pressure, and pulse ox.  

## 2013-08-15 NOTE — ED Notes (Signed)
pharmacy called about pt heparin infusion that she came in with. Pharmacy tech to come evaluate Heparin bag infusing from arrival. MD aware of blood pressures.

## 2013-08-15 NOTE — ED Provider Notes (Signed)
Patient seen on arrival from outside hospital. Patient is awake and alert. Vital signs notable for hypotension. Chart review demonstrates the patient has a history of hypertension. After arrival I discussed her care with her vascular surgery team. I reviewed results, and given the patient's new found a DVT, she will be started on heparin, admitted by vascular surgery.  Valerie Muskrat, MD 08/15/13 [phone]

## 2013-08-15 NOTE — Progress Notes (Signed)
ANTICOAGULATION CONSULT NOTE - Initial Consult  Pharmacy Consult for Heparin Indication: DVT  Allergies  Allergen Reactions  . Latex   . Other     Pneumonia shot   . Penicillins   . Pneumococcal Vaccines   . Sulfa Antibiotics     Patient Measurements: Height: 5\' 2"  (157.5 cm) Weight: 110 lb (49.896 kg) IBW/kg (Calculated) : 50.1  Vital Signs: Temp: 97.9 F (36.6 C) (07/01 1914) Temp src: Oral (07/01 1914) BP: 89/59 mmHg (07/01 1930) Pulse Rate: 86 (07/01 1914)  Labs: No results found for this basename: HGB, HCT, PLT, APTT, LABPROT, INR, HEPARINUNFRC, CREATININE, CKTOTAL, CKMB, TROPONINI,  in the last 72 hours  Estimated Creatinine Clearance: 4.8 ml/min (by C-G formula based on Cr of 7.21).   Medical History: Past Medical History  Diagnosis Date  . Hypertension   . Diabetes mellitus   . Renal failure   . Mitral valve prolapse   . Hyperlipidemia   . CHF (congestive heart failure)   . Atrial fibrillation   . Arthritis   . 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 ESRD on peritoneal dialysis 07/15/2011    Started peritoneal dialysis in October 2011 and follows with the PD clinic in [place]. Has had recurrent problems with infections starting around 2012 or 2013.       Medications:  See electronic med rec  Assessment: 80 y.o. female presents from [facility] where she was found to have extensive L leg DVT. There was a concern for phlegmasia so she was transferred to [facility] for vascular consult. Vascular note states no plegmasia, no plans for lysis. Recommend heparin bridge to oral anticoagulation.  Heparin started at [facility]: 3000 unit IV bolus and 500 units/hr (50 units/ml concentration) started ~1700. Baseline Hgb 11, Hct 34.6.  Goal of Therapy:  Heparin level 0.3-0.7 units/ml Monitor platelets by anticoagulation protocol: Yes   Plan:  1. Continue heparin at 500 units/hr 2. Will f/u 8 hr heparin level 3. Daily heparin level and CBC 4. F/u start of  oral anticoagulation  Sherlon Handing, PharmD, BCPS Clinical pharmacist, pager [phone] 08/15/2013,7:49 PM

## 2013-08-16 ENCOUNTER — Inpatient Hospital Stay (HOSPITAL_COMMUNITY): Payer: Medicare Other

## 2013-08-16 DIAGNOSIS — I519 Heart disease, unspecified: Secondary | ICD-10-CM

## 2013-08-16 DIAGNOSIS — I9589 Other hypotension: Secondary | ICD-10-CM

## 2013-08-16 DIAGNOSIS — K659 Peritonitis, unspecified: Secondary | ICD-10-CM

## 2013-08-16 LAB — COMPREHENSIVE METABOLIC PANEL
ALBUMIN: 1.6 g/dL — AB (ref 3.5–5.2)
ALK PHOS: 82 U/L (ref 39–117)
ALT: 13 U/L (ref 0–35)
AST: 24 U/L (ref 0–37)
Anion gap: 18 — ABNORMAL HIGH (ref 5–15)
BUN: 15 mg/dL (ref 6–23)
CO2: 20 meq/L (ref 19–32)
CREATININE: 4.42 mg/dL — AB (ref 0.50–1.10)
Calcium: 7.2 mg/dL — ABNORMAL LOW (ref 8.4–10.5)
Chloride: 97 mEq/L (ref 96–112)
GFR calc Af Amer: 10 mL/min — ABNORMAL LOW (ref >90)
GFR, EST NON AFRICAN AMERICAN: 9 mL/min — AB (ref >90)
Glucose, Bld: 109 mg/dL — ABNORMAL HIGH (ref 70–99)
POTASSIUM: 2.7 meq/L — AB (ref 3.7–5.3)
SODIUM: 135 meq/L — AB (ref 137–147)
Total Bilirubin: 0.3 mg/dL (ref 0.3–1.2)
Total Protein: 4.8 g/dL — ABNORMAL LOW (ref 6.0–8.3)

## 2013-08-16 LAB — PHOSPHORUS: Phosphorus: 4.6 mg/dL (ref 2.3–4.6)

## 2013-08-16 LAB — BODY FLUID CELL COUNT WITH DIFFERENTIAL
Eos, Fluid: NONE SEEN %
WBC FLUID: 6 uL (ref 0–1000)

## 2013-08-16 LAB — GLUCOSE, CAPILLARY
GLUCOSE-CAPILLARY: 115 mg/dL — AB (ref 70–99)
GLUCOSE-CAPILLARY: 100 mg/dL — AB (ref 70–99)
Glucose-Capillary: 129 mg/dL — ABNORMAL HIGH (ref 70–99)
Glucose-Capillary: 150 mg/dL — ABNORMAL HIGH (ref 70–99)
Glucose-Capillary: 145 mg/dL — ABNORMAL HIGH (ref 70–99)

## 2013-08-16 LAB — CBC WITH DIFFERENTIAL/PLATELET
Basophils Absolute: 0 10*3/uL (ref 0.0–0.1)
Basophils Relative: 0 % (ref 0–1)
EOS ABS: 0.1 10*3/uL (ref 0.0–0.7)
Eosinophils Relative: 0 % (ref 0–5)
HCT: 29.4 % — ABNORMAL LOW (ref 36.0–46.0)
HEMOGLOBIN: 9.4 g/dL — AB (ref 12.0–15.0)
LYMPHS ABS: 2.5 10*3/uL (ref 0.7–4.0)
LYMPHS PCT: 16 % (ref 12–46)
MCH: 28.6 pg (ref 26.0–34.0)
MCHC: 32 g/dL (ref 30.0–36.0)
MCV: 89.4 fL (ref 78.0–100.0)
Monocytes Absolute: 1 10*3/uL (ref 0.1–1.0)
Monocytes Relative: 6 % (ref 3–12)
NEUTROS PCT: 78 % — AB (ref 43–77)
Neutro Abs: 12.6 10*3/uL — ABNORMAL HIGH (ref 1.7–7.7)
PLATELETS: 246 10*3/uL (ref 150–400)
RBC: 3.29 MIL/uL — AB (ref 3.87–5.11)
RDW: 18.3 % — ABNORMAL HIGH (ref 11.5–15.5)
WBC: 16.1 10*3/uL — ABNORMAL HIGH (ref 4.0–10.5)

## 2013-08-16 LAB — CBC
HCT: 32.3 % — ABNORMAL LOW (ref 36.0–46.0)
HEMATOCRIT: 29.5 % — AB (ref 36.0–46.0)
HEMOGLOBIN: 9.4 g/dL — AB (ref 12.0–15.0)
Hemoglobin: 10.2 g/dL — ABNORMAL LOW (ref 12.0–15.0)
MCH: 28.4 pg (ref 26.0–34.0)
MCH: 28.5 pg (ref 26.0–34.0)
MCHC: 31.6 g/dL (ref 30.0–36.0)
MCHC: 31.9 g/dL (ref 30.0–36.0)
MCV: 89.1 fL (ref 78.0–100.0)
MCV: 90.2 fL (ref 78.0–100.0)
Platelets: 204 10*3/uL (ref 150–400)
Platelets: 216 10*3/uL (ref 150–400)
RBC: 3.31 MIL/uL — ABNORMAL LOW (ref 3.87–5.11)
RBC: 3.58 MIL/uL — ABNORMAL LOW (ref 3.87–5.11)
RDW: 17.7 % — ABNORMAL HIGH (ref 11.5–15.5)
RDW: 18.1 % — AB (ref 11.5–15.5)
WBC: 16.4 10*3/uL — ABNORMAL HIGH (ref 4.0–10.5)
WBC: 17.9 10*3/uL — ABNORMAL HIGH (ref 4.0–10.5)

## 2013-08-16 LAB — HEPARIN LEVEL (UNFRACTIONATED)
HEPARIN UNFRACTIONATED: 1.06 [IU]/mL — AB (ref 0.30–0.70)
Heparin Unfractionated: 0.18 IU/mL — ABNORMAL LOW (ref 0.30–0.70)
Heparin Unfractionated: 0.83 IU/mL — ABNORMAL HIGH (ref 0.30–0.70)

## 2013-08-16 LAB — TSH: TSH: 8.67 u[IU]/mL — ABNORMAL HIGH (ref 0.350–4.500)

## 2013-08-16 LAB — HEMOGLOBIN A1C
Hgb A1c MFr Bld: 5.4 % (ref <5.7)
MEAN PLASMA GLUCOSE: 108 mg/dL (ref <117)

## 2013-08-16 LAB — VANCOMYCIN, RANDOM: VANCOMYCIN RM: 11.7 ug/mL

## 2013-08-16 LAB — PROTIME-INR
INR: 1.28 (ref 0.00–1.49)
PROTHROMBIN TIME: 16 s — AB (ref 11.6–15.2)

## 2013-08-16 LAB — TROPONIN I
Troponin I: <0.3 ng/mL (ref <0.30)
Troponin I: <0.3 ng/mL (ref <0.30)

## 2013-08-16 LAB — MAGNESIUM: MAGNESIUM: 1.4 mg/dL — AB (ref 1.5–2.5)

## 2013-08-16 MED ORDER — DEXTROSE 5 % IV SOLN
1.0000 g | Freq: Once | INTRAVENOUS | Status: AC
  Administered 2013-08-16: 1 g via INTRAVENOUS
  Filled 2013-08-16: qty 1

## 2013-08-16 MED ORDER — SODIUM CHLORIDE 0.9 % IJ SOLN: 3.0000 mL | Freq: Two times a day (BID) | INTRAMUSCULAR | Status: DC

## 2013-08-16 MED ORDER — POTASSIUM CHLORIDE 20 MEQ/15ML (10%) PO LIQD
40.0000 meq | ORAL | Status: DC
  Administered 2013-08-16: 40 meq via ORAL
  Filled 2013-08-16 (×3): qty 30

## 2013-08-16 MED ORDER — AMIODARONE HCL 100 MG PO TABS
100.0000 mg | ORAL_TABLET | Freq: Every day | ORAL | Status: DC
  Administered 2013-08-17 – 2013-08-20 (×3): 100 mg via ORAL
  Filled 2013-08-16 (×6): qty 1

## 2013-08-16 MED ORDER — METOCLOPRAMIDE HCL 5 MG/ML IJ SOLN
5.0000 mg | Freq: Four times a day (QID) | INTRAMUSCULAR | Status: DC | PRN
  Filled 2013-08-16: qty 1

## 2013-08-16 MED ORDER — WARFARIN SODIUM 2 MG PO TABS
2.0000 mg | ORAL_TABLET | Freq: Every day | ORAL | Status: DC
  Administered 2013-08-16: 2 mg via ORAL
  Filled 2013-08-16 (×2): qty 1

## 2013-08-16 MED ORDER — SODIUM CHLORIDE 0.9 % IV SOLN: 250.0000 mL | INTRAVENOUS | Status: DC | PRN

## 2013-08-16 MED ORDER — LEVOTHYROXINE SODIUM 75 MCG PO TABS
75.0000 ug | ORAL_TABLET | Freq: Every day | ORAL | Status: DC
  Administered 2013-08-16 – 2013-08-18 (×3): 75 ug via ORAL
  Filled 2013-08-16 (×6): qty 1

## 2013-08-16 MED ORDER — ACETAMINOPHEN 325 MG PO TABS
650.0000 mg | ORAL_TABLET | Freq: Four times a day (QID) | ORAL | Status: DC | PRN
  Administered 2013-08-18: 650 mg via ORAL
  Filled 2013-08-16: qty 2

## 2013-08-16 MED ORDER — NEPRO/CARBSTEADY PO LIQD
237.0000 mL | Freq: Every day | ORAL | Status: DC
  Administered 2013-08-18: 237 mL via ORAL
  Filled 2013-08-16 (×2): qty 237

## 2013-08-16 MED ORDER — MIDODRINE HCL 5 MG PO TABS
10.0000 mg | ORAL_TABLET | Freq: Three times a day (TID) | ORAL | Status: DC
  Administered 2013-08-16 – 2013-08-20 (×10): 10 mg via ORAL
  Filled 2013-08-16 (×16): qty 2

## 2013-08-16 MED ORDER — WARFARIN - PHARMACIST DOSING INPATIENT: Freq: Every day | Status: DC

## 2013-08-16 MED ORDER — SEVELAMER CARBONATE 800 MG PO TABS
2400.0000 mg | ORAL_TABLET | Freq: Three times a day (TID) | ORAL | Status: DC
  Administered 2013-08-16 (×2): 2400 mg via ORAL
  Filled 2013-08-16 (×10): qty 3

## 2013-08-16 MED ORDER — MIRTAZAPINE 15 MG PO TABS
15.0000 mg | ORAL_TABLET | Freq: Every day | ORAL | Status: DC
  Administered 2013-08-16 (×2): 15 mg via ORAL
  Filled 2013-08-16 (×3): qty 1

## 2013-08-16 MED ORDER — ONDANSETRON HCL 4 MG/2ML IJ SOLN
4.0000 mg | Freq: Four times a day (QID) | INTRAMUSCULAR | Status: DC | PRN
  Administered 2013-08-17: 4 mg via INTRAVENOUS
  Filled 2013-08-16: qty 2

## 2013-08-16 MED ORDER — DEXTROSE 5 % IV SOLN
500.0000 mg | INTRAVENOUS | Status: DC
  Administered 2013-08-17 – 2013-08-19 (×3): 500 mg via INTRAVENOUS
  Filled 2013-08-16 (×5): qty 0.5

## 2013-08-16 MED ORDER — INSULIN ASPART 100 UNIT/ML ~~LOC~~ SOLN
0.0000 [IU] | Freq: Three times a day (TID) | SUBCUTANEOUS | Status: DC
  Administered 2013-08-16 – 2013-08-19 (×4): 1 [IU] via SUBCUTANEOUS

## 2013-08-16 MED ORDER — PANTOPRAZOLE SODIUM 40 MG PO TBEC
40.0000 mg | DELAYED_RELEASE_TABLET | Freq: Every day | ORAL | Status: DC
  Administered 2013-08-18 – 2013-08-20 (×2): 40 mg via ORAL
  Filled 2013-08-16 (×3): qty 1

## 2013-08-16 MED ORDER — GABAPENTIN 100 MG PO CAPS
100.0000 mg | ORAL_CAPSULE | Freq: Every evening | ORAL | Status: DC | PRN
  Filled 2013-08-16: qty 2

## 2013-08-16 MED ORDER — ASPIRIN 81 MG PO CHEW
81.0000 mg | CHEWABLE_TABLET | Freq: Every day | ORAL | Status: DC
  Administered 2013-08-18 – 2013-08-20 (×2): 81 mg via ORAL
  Filled 2013-08-16 (×4): qty 1

## 2013-08-16 MED ORDER — DOCUSATE SODIUM 100 MG PO CAPS
100.0000 mg | ORAL_CAPSULE | Freq: Two times a day (BID) | ORAL | Status: DC
  Administered 2013-08-16 – 2013-08-17 (×2): 100 mg via ORAL
  Filled 2013-08-16 (×6): qty 1

## 2013-08-16 MED ORDER — HEPARIN SODIUM (PORCINE) 1000 UNIT/ML IJ SOLN: 500.0000 [IU] | INTRAMUSCULAR | Status: DC

## 2013-08-16 MED ORDER — HEPARIN BOLUS VIA INFUSION
1500.0000 [IU] | Freq: Once | INTRAVENOUS | Status: AC
  Administered 2013-08-16: 1500 [IU] via INTRAVENOUS
  Filled 2013-08-16: qty 1500

## 2013-08-16 MED ORDER — ONDANSETRON HCL 4 MG PO TABS: 4.0000 mg | ORAL_TABLET | Freq: Four times a day (QID) | ORAL | Status: DC | PRN

## 2013-08-16 MED ORDER — HEPARIN (PORCINE) IN NACL 100-0.45 UNIT/ML-% IJ SOLN
550.0000 [IU]/h | INTRAMUSCULAR | Status: DC
  Administered 2013-08-17: 500 [IU]/h via INTRAVENOUS
  Administered 2013-08-19: 550 [IU]/h via INTRAVENOUS
  Filled 2013-08-16 (×5): qty 250

## 2013-08-16 MED ORDER — SODIUM CHLORIDE 0.9 % IJ SOLN: 3.0000 mL | INTRAMUSCULAR | Status: DC | PRN

## 2013-08-16 MED ORDER — WARFARIN SODIUM 5 MG PO TABS: 5.0000 mg | ORAL_TABLET | Freq: Once | ORAL | Status: DC

## 2013-08-16 MED ORDER — VANCOMYCIN HCL IN DEXTROSE 750-5 MG/150ML-% IV SOLN
750.0000 mg | Freq: Once | INTRAVENOUS | Status: AC
  Administered 2013-08-16: 750 mg via INTRAVENOUS
  Filled 2013-08-16: qty 150

## 2013-08-16 MED ORDER — MEGESTROL ACETATE 40 MG/ML PO SUSP
400.0000 mg | Freq: Every day | ORAL | Status: DC
  Administered 2013-08-17: 400 mg via ORAL
  Filled 2013-08-16 (×2): qty 10

## 2013-08-16 MED ORDER — ACETAMINOPHEN 650 MG RE SUPP: 650.0000 mg | Freq: Four times a day (QID) | RECTAL | Status: DC | PRN

## 2013-08-16 MED ORDER — DELFLEX-LC/1.5% DEXTROSE 346 MOSM/L IP SOLN: INTRAPERITONEAL | Status: DC

## 2013-08-16 MED ORDER — ROPINIROLE HCL 0.25 MG PO TABS
0.2500 mg | ORAL_TABLET | Freq: Every day | ORAL | Status: DC
  Administered 2013-08-16 (×2): 0.25 mg via ORAL
  Filled 2013-08-16 (×3): qty 1

## 2013-08-16 MED ORDER — INSULIN ASPART 100 UNIT/ML ~~LOC~~ SOLN: 0.0000 [IU] | Freq: Every day | SUBCUTANEOUS | Status: DC

## 2013-08-16 NOTE — Progress Notes (Signed)
CRITICAL VALUE ALERT  Critical value received:  Potassium 2.7  Date of notification:  08/16/2013  Time of notification:  04:52AM   Critical value read back:Yes.    Nurse who received alert:  Darel Hong, RN   MD notified (1st page):  Phone page per admission note to call back to [facility], [place]  Time of first page:  04:55  MD notified (2nd page):Covering [facility]-text page with low K level sent.   Time of second page:05:05  Third page to number [phone], MD [place] 05:31a  Fourth Page again to [facility] on call, Baltazar Najjar, MD 05:45  Responding MD:  Baltazar Najjar MD placed orders for liquid KCL at 0551.   Clovis Riley, RN

## 2013-08-16 NOTE — Progress Notes (Addendum)
Chart reviewed.   [facility] PROGRESS NOTE  LUCRETIA PENDLEY KVQ:[id number] DOB: [birth year] DOA: 08/15/2013 PCP: Laverna Peace, NP  Assessment/Plan:  Principal Problem:   Left leg DVT (deep venous thrombosis): on heparin and coumadin per admitting physician and vascular surgery. Not a candidate for NOAC due to renal failure Active Problems:   ESRD on peritoneal dialysis   Peritonitis   Diastolic dysfunction-grade 1    Chronic hypotension   Atrial fibrillation   CAD (coronary artery disease)   Diabetes mellitus   Protein-calorie malnutrition, severe Hypokalemia being repleted.  Prognosis appears extremely poor.  Pt is bed and WC bound, chronic hypotension, lethargic. No family available, but H&P reports pt wants to continue PD, abx, admission for blood thinner.  This is the first time I have met the patient. Will d/w Dr. Melvia Heaps and previous hospitalist attending.  Appears pt has reached a point where comfort care is most appropriate.  D/c tele. See below for addendum.  Code Status:  DNR Family Communication:  Daughter in Sports coach. Disposition Plan:    Consultants:  nephrology  Procedures:     Antibiotics:  Per nephrology  HPI/Subjective: "Tired."  Objective: Filed Vitals:   08/16/13 0431  BP: 74/52  Pulse: 91  Temp: 97.4 F (36.3 C)  Resp: 20   No intake or output data in the 24 hours ending 08/16/13 0901 Filed Weights   08/15/13 1947  Weight: 49.896 kg (110 lb)    Exam:   General:  Weak, frail, asleep, briefly arousable, voice quiet. Falls quickly back asleep  Cardiovascular: RRR  Respiratory: CTA  Abdomen: S, NT, ND  Ext: left leg edema  Basic Metabolic Panel:  Recent Labs Lab 08/16/13 0349  NA 135*  K 2.7*  CL 97  CO2 20  GLUCOSE 109*  BUN 15  CREATININE 4.42*  CALCIUM 7.2*  MG 1.4*  PHOS 4.6   Liver Function Tests:  Recent Labs Lab 08/16/13 0349  AST 24  ALT 13  ALKPHOS 82  BILITOT 0.3  PROT 4.8*  ALBUMIN 1.6*   No  results found for this basename: LIPASE, AMYLASE,  in the last 168 hours No results found for this basename: AMMONIA,  in the last 168 hours CBC:  Recent Labs Lab 08/16/13 0032 08/16/13 0349  WBC 16.4* 16.1*  NEUTROABS  --  12.6*  HGB 9.4* 9.4*  HCT 29.5* 29.4*  MCV 89.1 89.4  PLT 216 246   Cardiac Enzymes:  Recent Labs Lab 08/16/13 0349  TROPONINI <0.30   BNP (last 3 results) No results found for this basename: PROBNP,  in the last 8760 hours CBG:  Recent Labs Lab 08/16/13 0145 08/16/13 0649  GLUCAP 115* 100*    No results found for this or any previous visit (from the past 240 hour(s)).   Studies: Dg Abd 1 View  08/16/2013   CLINICAL DATA:  Nausea and vomiting.  Weakness.  EXAM: ABDOMEN - 1 VIEW  COMPARISON:  CT, 01/04/2013  FINDINGS: Bowel gas pattern is unremarkable with no evidence of obstruction or generalized adynamic ileus.  Dialysis catheter curls in the low pelvis. There are aortic, splenic and iliac artery vascular calcifications. Soft tissues are otherwise unremarkable. Bony structures are demineralized with degenerative changes noted along the visible spine.  IMPRESSION: No acute findings.  No obstruction.   Electronically Signed   By: Lajean Manes M.D.   On: 08/16/2013 08:06    Scheduled Meds: . amiodarone  100 mg Oral Daily  . aspirin  81 mg Oral  Daily  . docusate sodium  100 mg Oral BID  . insulin aspart  0-5 Units Subcutaneous QHS  . insulin aspart  0-9 Units Subcutaneous TID WC  . levothyroxine  75 mcg Oral QAC breakfast  . megestrol  400 mg Oral Daily  . midodrine  10 mg Oral TID WC  . mirtazapine  15 mg Oral QHS  . pantoprazole  40 mg Oral Daily  . rOPINIRole  0.25 mg Oral QHS  . sevelamer carbonate  2,400 mg Oral TID WC  . sodium chloride  3 mL Intravenous Q12H  . sodium chloride  3 mL Intravenous Q12H  . warfarin  5 mg Oral ONCE-1800  . Warfarin - Pharmacist Dosing Inpatient   Does not apply q1800   Continuous Infusions: . heparin 700  Units/hr (08/16/13 0149)    Time spent: 35 minutes  [facility] Pager [phone]. If 7PM-7AM, please contact night-coverage at [email or web address], password [facility] 08/16/2013, 9:01 AM  LOS: 1 day   Addendum:  Discussed with Dr. Melvia Heaps. He agrees with prognosis and that comfort care would be appropriate.  Discussed with daughter in law at bedside.  She reports pt is not yet on hospice, but hospice RN came to the house to consult yesterday.  At that time, patient did not want to stop PD.  Family would like for patient to be on hospice and comfort care, but do not want pt to feel "they are giving up on her".   They would like to take patient home with hospice if able.  Will consult palliative care team to assist with goals of care and transitioning to hospice.  Daughter in law agreeable.  Patient remains lethargic.  Continue DNR and current management for now.  Doree Barthel, MD

## 2013-08-16 NOTE — Progress Notes (Signed)
ANTIBIOTIC CONSULT NOTE - INITIAL  Pharmacy Consult for vancomycin and ceftazidime Indication: culture negative peritonitis  Allergies  Allergen Reactions  . Latex     Made me sick   . Other     Pneumonia shot   . Penicillins     Swelling   . Pneumococcal Vaccines     Arms swelling   . Sulfa Antibiotics     Sick on my stomach     Patient Measurements: Height: 5\' 2"  (157.5 cm) Weight: 110 lb (49.896 kg) IBW/kg (Calculated) : 50.1   Vital Signs: Temp: 97.4 F (36.3 C) (07/02 0431) Temp src: Oral (07/02 0431) BP: 88/42 mmHg (07/02 1300) Pulse Rate: 91 (07/02 0431) Intake/Output from previous day:   Intake/Output from this shift:    Labs:  Recent Labs  08/16/13 0032 08/16/13 0349 08/16/13 0914  WBC 16.4* 16.1* 17.9*  HGB 9.4* 9.4* 10.2*  PLT 216 246 204  CREATININE  --  4.42*  --    Estimated Creatinine Clearance: 7.9 ml/min (by C-G formula based on Cr of 4.42).  Recent Labs  08/16/13 0138  VANCORANDOM 11.7     Microbiology: No results found for this or any previous visit (from the past 720 hour(s)).  Medical History: Past Medical History  Diagnosis Date  . Hypertension   . Diabetes mellitus   . Renal failure   . Mitral valve prolapse   . Hyperlipidemia   . CHF (congestive heart failure)   . Atrial fibrillation   . Arthritis   . 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 ESRD on peritoneal dialysis 07/15/2011    Started peritoneal dialysis in October 2011 and follows with the PD clinic in [place]. Has had recurrent problems with infections starting around 2012 or 2013.       Assessment:  80 yo F transferred from [facility].  on IP antibiotics since June 22nd (Vanc and [place]) for a culture-negative episode of peritonitis. Last vanc was IP 1.25 gm on 6/27, and fortaz has been 1 gm IP every day given by family at home.  Pharmacy has been consulted to dose vanc/ceftaz by IV route while in hospital.   7/1 Vanc random 11.7 mcg/ml.  Last PD exchange was  Tuesday.  Last IP vanc was IP 1.25 gm on 6/27 and she has been getting fortaz 1 gm IP every day by family at home.    Goal of Therapy:  redose vanc when vanc random level < = 20 mcg/ml  Plan:  1. Ceftazidime 1 gm IV x 1 dose today, then ceftazidime 500 mg IV q24 2. Vancomycin 750 mg IV x 1 dose today, then check vanc random in 5 days and redose when < = 20 mcg/ml 3. F/u plans Eudelia Bunch, Pharm.D. [phone] 08/16/2013 2:20 PM

## 2013-08-16 NOTE — Progress Notes (Signed)
Left upper arm avg non functional. No thrill no bruit.

## 2013-08-16 NOTE — Progress Notes (Signed)
Patient transferred to 2W from ED. Tele on. V/S stable for patient. 2L O2, IV with Heparin gtt 500units/hr. Patient states pain slightly better. Assessment completed. Patient oriented to floor and room.   Family at bedside, states patient has difficulty with nausea and keeping food/pills down over the last several days. Advised to make sure patient is sitting full upright and break pills in half if able.   Peritoneal dialysis to be done in am per ED RN.   Clovis Riley, RN

## 2013-08-16 NOTE — Progress Notes (Addendum)
INITIAL NUTRITION ASSESSMENT  DOCUMENTATION CODES Per approved criteria  -Non-severe (moderate) malnutrition in the context of chronic illness   INTERVENTION: Nepro Shake po daily, each supplement provides 425 kcal and 19 grams protein RD to follow for nutrition care plan  NUTRITION DIAGNOSIS: Increased nutrient needs related to ESRD on PD as evidenced by estimated nutrition needs  Goal: Pt to meet >/= 90% of their estimated nutrition needs   Monitor:  PO & supplemental intake, weight, labs, I/O's  Reason for Assessment: Malnutrition Screening Tool Report  80 y.o. female  Admitting Dx: DVT (deep venous thrombosis)  ASSESSMENT: 80 y.o. Female with PMH of HTN, DM, renal failure, CHF and ESRD on peritoneal dialysis; presented to [facility] and eventually was diagnosed with large DVT; transferred to [facility] ER to be seen by Vascular Surgery for possible thrombectomy; pt determined not to be surgical candidate.  Patient sleepy upon RD visit; limited nutrition hx obtained; unable to obtain usual intake recall; pt seen per Clinical Nutrition during previous hospitalizations -- pt with hx of poor PO intake; noted pt on Megace; pt wt readings, patient with progressive weight loss since end of November 2014 (9%); would benefit from oral nutrition supplement -- RD to order.  Overall prognosis is poor, however, family wishes to proceed with PD.  Nutrition Focused Physical Exam:   Subcutaneous Fat:  Orbital Region: mild to moderate wasting  Upper Arm Region: severe wasting  Thoracic and Lumbar Region: N/A  Muscle:  Temple Region: mild to moderate wasting  Clavicle Bone Region: mild to moderate wasting  Clavicle and Acromion Bone Region: mild to moderate wasting  Scapular Bone Region: N/A Dorsal Hand: N/A Patellar Region: N/A Anterior Thigh Region: N/A Posterior Calf Region: N/A  Edema: none  Patient continues to meet criteria for non-severe (moderate) malnutrition in  the context of chronic illness as evidenced by suspected intake of < 75% intake of estimated energy requirement for > 1 month and mild to moderate muscle loss & subcutaneous fat loss.  Height: Ht Readings from Last 1 Encounters:  08/15/13 5\' 2"  (1.575 m)    Weight: Wt Readings from Last 1 Encounters:  08/15/13 110 lb (49.896 kg)    Ideal Body Weight: 110 lb  % Ideal Body Weight: 100%  Wt Readings from Last 10 Encounters:  08/15/13 110 lb (49.896 kg)  07/03/13 113 lb 15.7 oz (51.7 kg)  01/09/13 121 lb 7.6 oz (55.1 kg)  10/12/12 113 lb (51.256 kg)  07/15/11 111 lb 3.2 oz (50.44 kg)    Usual Body Weight: 121 lb -- November 2014  % Usual Body Weight: 91%  BMI:  Body mass index is 20.11 kg/(m^2).  Estimated Nutritional Needs: Kcal: 1500-1700 Protein: 70-80 gm Fluid: 1200 ml  Skin: Intact  Diet Order: Renal w/1200 ml fluid restriction   EDUCATION NEEDS: -No education needs identified at this time  Labs:   Recent Labs Lab 08/16/13 0349  NA 135*  K 2.7*  CL 97  CO2 20  BUN 15  CREATININE 4.42*  CALCIUM 7.2*  MG 1.4*  PHOS 4.6  GLUCOSE 109*    CBG (last 3)   Recent Labs  08/16/13 0145 08/16/13 0649 08/16/13 1113  GLUCAP 115* 100* 129*    Scheduled Meds: . amiodarone  100 mg Oral Daily  . aspirin  81 mg Oral Daily  . docusate sodium  100 mg Oral BID  . insulin aspart  0-5 Units Subcutaneous QHS  . insulin aspart  0-9 Units Subcutaneous TID WC  .  levothyroxine  75 mcg Oral QAC breakfast  . megestrol  400 mg Oral Daily  . midodrine  10 mg Oral TID WC  . mirtazapine  15 mg Oral QHS  . pantoprazole  40 mg Oral Daily  . rOPINIRole  0.25 mg Oral QHS  . sevelamer carbonate  2,400 mg Oral TID WC  . sodium chloride  3 mL Intravenous Q12H  . sodium chloride  3 mL Intravenous Q12H  . warfarin  2 mg Oral q1800  . Warfarin - Pharmacist Dosing Inpatient   Does not apply q1800    Continuous Infusions: . heparin      Past Medical History  Diagnosis  Date  . Hypertension   . Diabetes mellitus   . Renal failure   . Mitral valve prolapse   . Hyperlipidemia   . CHF (congestive heart failure)   . Atrial fibrillation   . Arthritis   . 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 ESRD on peritoneal dialysis 07/15/2011    Started peritoneal dialysis in October 2011 and follows with the PD clinic in [place]. Has had recurrent problems with infections starting around 2012 or 2013.       Past Surgical History  Procedure Laterality Date  . Nephrectomy      right kidney  . Abdominal hysterectomy    . Appendectomy    . Tonsillectomy    . Av fistula placement  2010    left brachiocephalic AVF  . Peritoneal catheter insertion  2011    Arthur Holms, RD, LDN Pager #: [phone] After-Hours Pager #: [phone]

## 2013-08-16 NOTE — Progress Notes (Signed)
[facility] for Heparin and coumadin Indication: DVT  Allergies  Allergen Reactions  . Latex     Made me sick   . Other     Pneumonia shot   . Penicillins     Swelling   . Pneumococcal Vaccines     Arms swelling   . Sulfa Antibiotics     Sick on my stomach     Patient Measurements: Height: 5\' 2"  (157.5 cm) Weight: 110 lb (49.896 kg) IBW/kg (Calculated) : 50.1  Vital Signs: Temp: 97.4 F (36.3 C) (07/02 0431) Temp src: Oral (07/02 0431) BP: 74/52 mmHg (07/02 0431) Pulse Rate: 91 (07/02 0431)  Labs:  Recent Labs  08/16/13 0032 08/16/13 0349 08/16/13 0914  HGB 9.4* 9.4* 10.2*  HCT 29.5* 29.4* 32.3*  PLT 216 246 204  LABPROT  --  16.0*  --   INR  --  1.28  --   HEPARINUNFRC 0.18*  --  1.06*  CREATININE  --  4.42*  --   TROPONINI  --  <0.30 <0.30    Estimated Creatinine Clearance: 7.9 ml/min (by C-G formula based on Cr of 4.42).   Medical History: Past Medical History  Diagnosis Date  . Hypertension   . Diabetes mellitus   . Renal failure   . Mitral valve prolapse   . Hyperlipidemia   . CHF (congestive heart failure)   . Atrial fibrillation   . Arthritis   . 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 ESRD on peritoneal dialysis 07/15/2011    Started peritoneal dialysis in October 2011 and follows with the PD clinic in [place]. Has had recurrent problems with infections starting around 2012 or 2013.      Assessment: 80 y.o. female presents from [facility] where she was found to have extensive L leg DVT. There was a concern for phlegmasia so she was transferred to [facility] for vascular consult. Vascular note states no plegmasia, no plans for lysis. Recommend heparin bridge to oral anticoagulation.  Per H&P she was previously on coumadin for afib but it was stopped because it was difficult to control. Day #1 of overlap of heparin and coumadin.  In the past in 01/04/2013 her coumadin dose was 1 mg alternating with 2 mg QOD. Wt 50 kg,  low albumin, age > 27, and amiodarone all put her in category for lower initial dosing strategy.  Pt on peritoneal dialysis.  Her first HL was low and now her HL is elevated at 1.06 after receiving a 1500 bolus and rate increase to 700 units/hr.  CBC stable with no bleeding reported.   Goal of Therapy:  Heparin level 0.3-0.7 units/ml Monitor platelets by anticoagulation protocol: Yes INR 2-3   Plan:  Hold heparin drip x 1 hour, then decrease rate to 600 units/hr and f/u with 8 hour HL  begin coumadin 2 mg today at 1800 Daily HL, CBC, INR  Eudelia Bunch, Pharm.D. [phone] 08/16/2013 11:37 AM

## 2013-08-16 NOTE — Progress Notes (Addendum)
ANTICOAGULATION CONSULT NOTE - Initial Consult  Pharmacy Consult for Heparin Indication: DVT  Allergies  Allergen Reactions  . Latex     Made me sick   . Other     Pneumonia shot   . Penicillins     Swelling   . Pneumococcal Vaccines     Arms swelling   . Sulfa Antibiotics     Sick on my stomach     Patient Measurements: Height: 5\' 2"  (157.5 cm) Weight: 110 lb (49.896 kg) IBW/kg (Calculated) : 50.1  Vital Signs: Temp: 97.9 F (36.6 C) (07/01 1914) Temp src: Oral (07/01 1914) BP: 76/48 mmHg (07/01 2300) Pulse Rate: 49 (07/01 2300)  Labs:  Recent Labs  08/16/13 0032  HGB 9.4*  HCT 29.5*  PLT 216  HEPARINUNFRC 0.18*    Estimated Creatinine Clearance: 4.8 ml/min (by C-G formula based on Cr of 7.21).   Medical History: Past Medical History  Diagnosis Date  . Hypertension   . Diabetes mellitus   . Renal failure   . Mitral valve prolapse   . Hyperlipidemia   . CHF (congestive heart failure)   . Atrial fibrillation   . Arthritis   . 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 ESRD on peritoneal dialysis 07/15/2011    Started peritoneal dialysis in October 2011 and follows with the PD clinic in [place]. Has had recurrent problems with infections starting around 2012 or 2013.       Medications:  See electronic med rec  Assessment: 80 y.o. female presents from [facility] where she was found to have extensive L leg DVT. There was a concern for phlegmasia so she was transferred to [facility] for vascular consult. Vascular note states no plegmasia, no plans for lysis. Recommend heparin bridge to oral anticoagulation. 1st heparin level is sub therapeutic at 0.18 with no infusion related issues. Current level most likely residual bolus effect as she is on only 10 units/kg/hr.     Goal of Therapy:  Heparin level 0.3-0.7 units/ml Monitor platelets by anticoagulation protocol: Yes   Plan:  rebolus with 1500 unitsx1 then increase to 700 units/hr and f/u with 8 hour HL    Curlene Dolphin [phone]     Add coumadin. Day #1 of overlap.   Begin coumadin 5 mg today at 1800  Daily INR starting tomorrow am.   Curlene Dolphin

## 2013-08-16 NOTE — Progress Notes (Signed)
UR Completed.  Lynix Bonine Jane [phone] 08/16/2013  

## 2013-08-16 NOTE — Care Management Note (Unsigned)
    Page 1 of 1   08/16/2013     3:48:39 PM CARE MANAGEMENT NOTE 08/16/2013  Patient:  Valerie Yoder, Valerie Yoder   Account Number:  [id number]  Date Initiated:  08/16/2013  Documentation initiated by:  Tommy Minichiello  Subjective/Objective Assessment:   Pt adm on 08/15/13 with Lt LE DVT, recurrent peritonitis. PTA, pt resides at home with family.     Action/Plan:   Per daughter, pt has been evaluated by Hospice, but not picked up, as they do not want to stop peritoneal dialysis. Palliative Care to evaluate for goals of care.  Daughter desires to take pt home.   Anticipated DC Date:  08/20/2013   Anticipated DC Plan:  [place]  CM consult      Choice offered to / List presented to:             Status of service:  In process, will continue to follow Medicare Important Message given?   (If response is "NO", the following Medicare IM given date fields will be blank) Date Medicare IM given:   Medicare IM given by:   Date Additional Medicare IM given:   Additional Medicare IM given by:    Discharge Disposition:    Per UR Regulation:  Reviewed for med. necessity/level of care/duration of stay  If discussed at [place] of Stay Meetings, dates discussed:    Comments:

## 2013-08-16 NOTE — Progress Notes (Signed)
Per MD order Rapid bolus Heparin given 1500units. Rate of infusion increased to 700units/hr (8ml/hr). Patient tolerated bolus.   Will continue to monitor.   Clovis Riley, RN

## 2013-08-16 NOTE — Consult Note (Addendum)
Renal Service Consult Note [place] L Gloeckner 08/16/2013 Sol Blazing Requesting Physician:  Dr Conley Canal  Reason for Consult:  ESRD pt on PD with AMS and peritonitis HPI: The patient is a 80 y.o. year-old with hx of HTN, DM, afib and ESRD on maintenance PD done at home by her daughter.  She has had problems with recurrent peritonitis for over a year.  She was seen here in May with a plan to transition to HD but her hypotension was to severe so it was decided to keep her on PD.  She was seen by Gen Surg for the question of catheter removal/replacement and they felt she was high risk for surgical complications and gave the pt/family several options and the pt/family chose to not attempt to remove the catheter and to use abx. She was a DNR and the plan was at the time of discharge was to continue PD as long as patient had a decent QOL and the infections were responding to abx.   Patient presented yesterday to [facility] ED with left leg pain and swelling x 4 days.  Diagnosed w LLE DVT and transferred here for admission.  Started on IV heparin overnight.    Today pt is confused and did not provide a reliable history.  No abd pain.    Of note patient has been on IP antibiotics since June 22nd (Vanc and [place]) for a culture-negative episode of peritonitis.  Last vanc was IP 1.25 gm on 6/27, and fortaz has been 1 gm IP every day given by family at home.    History of PD cath-related infections: July 2013 gram + bacilli  Oct 2013 cloudy fluid, ^TNC, no growth > cleared w abx  Mar 2014 cloudy fluid, 2630 TNC, no growth > cleared w abx  Sept 2014 Gram + bacilli  Nov 2014 - in hospital  Feb 22, 2103 Gram + Bacilli (Bacillus sp.)  Mar 23, 2013 Cloudy effluent, Rx fortaz and vanc  Jun 18, 2013 Gram + bacillus peritonitis, rx Tressie Ellis and vanc  ROS  no cp , sob  no n/v/d  no jt pain   no skin rash   no fevers  Past Medical History  Past Medical History  Diagnosis Date   . Hypertension   . Diabetes mellitus   . Renal failure   . Mitral valve prolapse   . Hyperlipidemia   . CHF (congestive heart failure)   . Atrial fibrillation   . Arthritis   . 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 ESRD on peritoneal dialysis 07/15/2011    Started peritoneal dialysis in October 2011 and follows with the PD clinic in [place]. Has had recurrent problems with infections starting around 2012 or 2013.      Past Surgical History  Past Surgical History  Procedure Laterality Date  . Nephrectomy      right kidney  . Abdominal hysterectomy    . Appendectomy    . Tonsillectomy    . Av fistula placement  2010    left brachiocephalic AVF  . Peritoneal catheter insertion  2011   Family History  Family History  Problem Relation Age of Onset  . Diabetes Father   . CAD Brother    Social History  reports that she has never smoked. She does not have any smokeless tobacco history on file. She reports that she does not drink alcohol or use illicit drugs. Allergies  Allergies  Allergen Reactions  . Latex  Made me sick   . Other     Pneumonia shot   . Penicillins     Swelling   . Pneumococcal Vaccines     Arms swelling   . Sulfa Antibiotics     Sick on my stomach    Home medications Prior to Admission medications   Medication Sig Start Date End Date Taking? Authorizing Provider  acetaminophen (TYLENOL) 500 MG tablet Take 1,000 mg by mouth daily as needed for fever.   Yes Historical Provider, MD  amiodarone (PACERONE) 200 MG tablet Take 100 mg by mouth daily.    Yes Historical Provider, MD  aspirin 81 MG chewable tablet Chew 81 mg by mouth daily.   Yes Historical Provider, MD  cefTAZidime 1 g in dextrose 5 % 50 mL Inject 1 g into the vein every 8 (eight) hours.   Yes Historical Provider, MD  clopidogrel (PLAVIX) 75 MG tablet Take 75 mg by mouth daily.   Yes Historical Provider, MD  diclofenac sodium (VOLTAREN) 1 % GEL Apply 2 g topically 4 (four) times daily.   Yes  Historical Provider, MD  docusate sodium (COLACE) 100 MG capsule Take 100 mg by mouth 2 (two) times daily.   Yes Historical Provider, MD  doxycycline (PERIOSTAT) 20 MG tablet Take 20 mg by mouth daily as needed. For pain per daughter   Yes Historical Provider, MD  epoetin alfa (EPOGEN,PROCRIT) 44315 UNIT/ML injection Inject 10,000 Units into the skin once a week.   Yes Historical Provider, MD  gabapentin (NEURONTIN) 100 MG capsule Take 100-200 mg by mouth at bedtime as needed (for pain).    Yes Historical Provider, MD  levothyroxine (SYNTHROID, LEVOTHROID) 75 MCG tablet Take 75 mcg by mouth daily before breakfast.   Yes Historical Provider, MD  lovastatin (MEVACOR) 20 MG tablet Take 20 mg by mouth daily.    Yes Historical Provider, MD  megestrol (MEGACE) 40 MG/ML suspension Take 400 mg by mouth daily.   Yes Historical Provider, MD  midodrine (PROAMATINE) 10 MG tablet Take 1 tablet (10 mg total) by mouth 3 (three) times daily with meals. 07/03/13  Yes Samella Parr, NP  mirtazapine (REMERON) 15 MG tablet Take 15 mg by mouth at bedtime.   Yes Historical Provider, MD  omeprazole (PRILOSEC) 20 MG capsule Take 20 mg by mouth 2 (two) times daily before a meal.   Yes Historical Provider, MD  ondansetron (ZOFRAN) 4 MG tablet Take 1 tablet (4 mg total) by mouth every 6 (six) hours as needed for nausea. 07/03/13  Yes Samella Parr, NP  polyethylene glycol (MIRALAX / GLYCOLAX) packet Take 17 g by mouth daily as needed for mild constipation.   Yes Historical Provider, MD  potassium chloride SA (K-DUR,KLOR-CON) 20 MEQ tablet Take 20 mEq by mouth 2 (two) times daily.   Yes Historical Provider, MD  rifampin (RIFADIN) 300 MG capsule Take 300 mg by mouth daily.   Yes Historical Provider, MD  rOPINIRole (REQUIP) 0.25 MG tablet Take 0.25 mg by mouth at bedtime.    Yes Historical Provider, MD  sevelamer carbonate (RENVELA) 800 MG tablet Take 2,400 mg by mouth 3 (three) times daily with meals.   Yes Historical  Provider, MD  vancomycin 1,000 mg in sodium chloride 0.9 % 250 mL Inject 1,000 mg into the vein every 12 (twelve) hours.   Yes Historical Provider, MD   Liver Function Tests  Recent Labs Lab 08/16/13 0349  AST 24  ALT 13  ALKPHOS 82  BILITOT 0.3  PROT 4.8*  ALBUMIN 1.6*   No results found for this basename: LIPASE, AMYLASE,  in the last 168 hours CBC  Recent Labs Lab 08/16/13 0032 08/16/13 0349 08/16/13 0914  WBC 16.4* 16.1* 17.9*  NEUTROABS  --  12.6*  --   HGB 9.4* 9.4* 10.2*  HCT 29.5* 29.4* 32.3*  MCV 89.1 89.4 90.2  PLT 216 246 518   Basic Metabolic Panel  Recent Labs Lab 08/16/13 0349  NA 135*  K 2.7*  CL 97  CO2 20  GLUCOSE 109*  BUN 15  CREATININE 4.42*  CALCIUM 7.2*  PHOS 4.6    Filed Vitals:   08/15/13 2245 08/15/13 2300 08/15/13 2357 08/16/13 0431  BP: 77/56 76/48 69/41  74/52  Pulse:  49 85 91  Temp:   97.9 F (36.6 C) 97.4 F (36.3 C)  TempSrc:   Oral Oral  Resp: 20 25 19 20   Height:   5\' 2"  (1.575 m)   Weight:      SpO2:  85% 97% 96%   Exam: Elderly frail WF, not in distress, somnolent, arousable, confused No rash, cyanosis or gangrene Sclera anicteric, throat clear No jvd Chest clear bilat, no rales rhonchi or wheezing RRR no MRG Abd mo distension, PD cath exit site is clean and dry, nontender +BS Left LE edema 2+ pitting entire leg, no edema RLE Neuro is confused, nonfocal, +asterixis  PD: Per dtr does the cycler at nights 4 exchanges then a day bag.  She talked with PD dept recently and they talked about moving away from the cycler cause it might be too hard on her and doing instead 2 or 3 manual exchanges per day.  She was on 2250 cc at night and 750 cc for the daybag.  Using all 1.5% due to low BP's. EPO 10K weekly  Assessment: 1 L leg acute DVT - on IV hep, per primary 2 ESRD on PD 3 Recurrent peritonitis- see above, currently on abx w Vanc/Fortaz for culture-negative peritonitis since 6/22, last vanc dose was IP on  6/27 4 Afib on amiodarone 5 Anemia on EPO 6 Chronic hypotension on midodrine 7 HPTH cont binders 8 DM not on medication  Plan- cont PD. Have d/w daughter today concerning level of aggressiveness of treatment and she says that the patient when last questioned does want to continue PD.  She is on "hospice" at home but this is not really related to her dialysis at all and I don't know what the indication is for that designation. So we will continue PD at this time.  Have d/w primary.  Well send a cell count to see if this most recent infection is under control or not.     Kelly Splinter MD (pgr) [phone]    [phone] 08/16/2013, 11:50 AM

## 2013-08-16 NOTE — Progress Notes (Signed)
08/16/13 Patient transferring into Rm 6E 25 with Dx of DVTm Diet renal with 1200 FR, DVT Coumadin and Heparin drip,Daily Wgt,CBG's achs, IV site 7/1 RPFA,Graft/Fisula LUA, and Mod fall risk.

## 2013-08-16 NOTE — Progress Notes (Signed)
ANTICOAGULATION CONSULT NOTE - Follow Up Consult  Pharmacy Consult for Heparin and Coumadin Indication: DVT left leg  Patient Measurements: Height: 5\' 2"  (157.5 cm) Weight: 112 lb 3.4 oz (50.9 kg) IBW/kg (Calculated) : 50.1 Heparin Dosing Weight: 51 kg  Labs:  Recent Labs  08/16/13 0032 08/16/13 0349 08/16/13 0914 08/16/13 1405 08/16/13 2153  HGB 9.4* 9.4* 10.2*  --   --   HCT 29.5* 29.4* 32.3*  --   --   PLT 216 246 204  --   --   LABPROT  --  16.0*  --   --   --   INR  --  1.28  --   --   --   HEPARINUNFRC 0.18*  --  1.06*  --  0.83*  CREATININE  --  4.42*  --   --   --   TROPONINI  --  <0.30 <0.30 <0.30  --     Estimated Creatinine Clearance: 7.9 ml/min (by C-G formula based on Cr of 4.42).  Assessment:   Heparin level is still supratherapeutic (0.83) tonight on 600 units/hr, but decreased from last level (1.06).  Overlap day # 1 Heparin and Coumadin.  Goal of Therapy:  INR 2-3 Heparin level 0.3-0.7 units/ml Monitor platelets by anticoagulation protocol: Yes   Plan:   Decrease heparin drip to 500 units/hr (~2 units/kg decrease).  Coumadin begun with 2 mg x1 tonight.  Next heparin level, CBC and PT/INR in am.  Arty Baumgartner, [place] Pager: [phone] 08/16/2013,10:33 PM

## 2013-08-16 NOTE — Progress Notes (Signed)
This RN notified of CAPD orders for patient. Pt's daughter stated pt hasn't a complete exchange since Tuesday. Labs obtained as ordered by nephrology. Exchange #1 completed. Patient to be transferred to [place]. Will monitor.  Joellen Jersey, RN.

## 2013-08-17 DIAGNOSIS — Z515 Encounter for palliative care: Secondary | ICD-10-CM

## 2013-08-17 LAB — CBC
HEMATOCRIT: 32.2 % — AB (ref 36.0–46.0)
Hemoglobin: 10.3 g/dL — ABNORMAL LOW (ref 12.0–15.0)
MCH: 28.8 pg (ref 26.0–34.0)
MCHC: 32 g/dL (ref 30.0–36.0)
MCV: 89.9 fL (ref 78.0–100.0)
PLATELETS: 220 10*3/uL (ref 150–400)
RBC: 3.58 MIL/uL — AB (ref 3.87–5.11)
RDW: 18.7 % — ABNORMAL HIGH (ref 11.5–15.5)
WBC: 23 10*3/uL — ABNORMAL HIGH (ref 4.0–10.5)

## 2013-08-17 LAB — GLUCOSE, CAPILLARY
GLUCOSE-CAPILLARY: 147 mg/dL — AB (ref 70–99)
Glucose-Capillary: 174 mg/dL — ABNORMAL HIGH (ref 70–99)
Glucose-Capillary: 153 mg/dL — ABNORMAL HIGH (ref 70–99)

## 2013-08-17 LAB — PREALBUMIN: Prealbumin: 20.5 mg/dL (ref 17.0–34.0)

## 2013-08-17 LAB — HEPARIN LEVEL (UNFRACTIONATED): Heparin Unfractionated: 0.5 IU/mL (ref 0.30–0.70)

## 2013-08-17 MED ORDER — MAGIC MOUTHWASH
5.0000 mL | Freq: Four times a day (QID) | ORAL | Status: DC
  Administered 2013-08-17 – 2013-08-18 (×3): 5 mL via ORAL
  Filled 2013-08-17 (×6): qty 5

## 2013-08-17 MED ORDER — CHLORHEXIDINE GLUCONATE 0.12 % MT SOLN
15.0000 mL | Freq: Two times a day (BID) | OROMUCOSAL | Status: DC
  Administered 2013-08-17 – 2013-08-20 (×4): 15 mL via OROMUCOSAL
  Filled 2013-08-17 (×7): qty 15

## 2013-08-17 MED ORDER — HYDROMORPHONE HCL PF 1 MG/ML IJ SOLN
0.2500 mg | INTRAMUSCULAR | Status: DC | PRN
  Administered 2013-08-18 – 2013-08-19 (×3): 0.25 mg via INTRAVENOUS
  Filled 2013-08-17 (×3): qty 1

## 2013-08-17 MED ORDER — WARFARIN SODIUM 2 MG PO TABS
2.0000 mg | ORAL_TABLET | Freq: Once | ORAL | Status: AC
  Administered 2013-08-17: 2 mg via ORAL
  Filled 2013-08-17: qty 1

## 2013-08-17 MED ORDER — FLUCONAZOLE 50 MG PO TABS
50.0000 mg | ORAL_TABLET | Freq: Once | ORAL | Status: AC
  Administered 2013-08-17: 50 mg via ORAL
  Filled 2013-08-17: qty 1

## 2013-08-17 MED ORDER — MAGIC MOUTHWASH
5.0000 mL | Freq: Three times a day (TID) | ORAL | Status: DC | PRN
  Administered 2013-08-17: 5 mL via ORAL
  Filled 2013-08-17: qty 5

## 2013-08-17 NOTE — Plan of Care (Signed)
Problem: Phase II Progression Outcomes Goal: Therapeutic drug levels for anticoagulation Outcome: Completed/Met Date Met:  08/17/13 Pt to begin bridge to coumadin today. Heparin gtt at 500 units/hr (5 cc/hr).

## 2013-08-17 NOTE — Progress Notes (Addendum)
Palliative Medicine Consult received-Will meet with patient and family at [facility] today. Lane Hacker, DO Palliative Medicine'

## 2013-08-17 NOTE — Progress Notes (Signed)
ANTICOAGULATION CONSULT NOTE - Follow Up Consult  Pharmacy Consult for Heparin and Coumadin Indication: DVT left leg  Patient Measurements: Height: 5\' 2"  (157.5 cm) Weight: 109 lb 12.6 oz (49.8 kg) (after drain) IBW/kg (Calculated) : 50.1 Heparin Dosing Weight: 51 kg  Labs:  Recent Labs  08/16/13 0349 08/16/13 0914 08/16/13 1405 08/16/13 2153 08/17/13 0515  HGB 9.4* 10.2*  --   --  10.3*  HCT 29.4* 32.3*  --   --  32.2*  PLT 246 204  --   --  220  LABPROT 16.0*  --   --   --   --   INR 1.28  --   --   --   --   HEPARINUNFRC  --  1.06*  --  0.83* 0.50  CREATININE 4.42*  --   --   --   --   TROPONINI <0.30 <0.30 <0.30  --   --     Estimated Creatinine Clearance: 7.8 ml/min (by C-G formula based on Cr of 4.42).  Assessment: Heparin level is therapeutic at 0.5 this AM on 500 units/hr.  Overlap day # 2 Heparin and Coumadin. Coumadin started yesterday 08/16/13.  INR not done today.  CBC stable with PLTC 220, HGB 10.3, no bleeding reported.  Goal of Therapy:  INR 2-3 Heparin level 0.3-0.7 units/ml Monitor platelets by anticoagulation protocol: Yes   Plan:  Continue IV heparin drip at 500 units/hr.  Give Coumadin 2mg  po today. Daily heparin level, CBC and PT/INR in AM  Nicole Cella, RPh Clinical Pharmacist Pager: [phone] 08/17/2013,12:39 PM

## 2013-08-17 NOTE — Progress Notes (Signed)
[facility] PROGRESS NOTE  Valerie Yoder TDD:[id number] DOB: 07/11/1930 DOA: 08/15/2013 PCP: Laverna Peace, NP  Brief narrative: Valerie Yoder is an 80yo woman with PMH Of ESRD on PD, HTN, DM, afib, and now chronic hypotension.  She was admitted previously for AMS and peritonitis.  She apparently has been struggling with recurrent peritonitis, but cannot have her access changed due to high risk.  She is not a candidate for HD given hypotension.  She is DNR.  She is now admitted for new DVT and is on heparin and coumadin.  Please see Nephrology note from 08/16/13 for details about her peritonitis.   Principal Problem: DVT (deep venous thrombosis) - On heparin and coumadin per pharmacy - INR yesterday 1.28 - Heparin as per pharmacy  Active Problems: ESRD on peritoneal dialysis - Per family, wants to continue with PD for now - PD daily, she is high risk for catheter replacement despite having recurrent peritonitis  Peritonitis - Treating with Abx, Ceftazadime  - Patient with recurrent issues with this and has been on ceftazadime since 6/22.   Diastolic dysfunction-grade 1/CAD and Chronic hypotension with Atrial fibrillation - She is on an aspirin and amiodarone which are being continued - BP chronically low and continues to be low this AM - She is on midodrine  Diabetes mellitus - ISS while in hospital  Protein-calorie malnutrition, severe - Will attempt to liberalize diet today as patient is not eating much of anything right now, nutrition is following and recommended nephro shakes which will be provided  This is my first time meeting [name], but she does seem to have a poor prognosis given her recurrent peritoneal infections and difficult access to change to HD.  PC will see her.   Consultants:  PC  Renal  Nutrition  Procedures/Studies: Dg Abd 1 View  08/16/2013   CLINICAL DATA:  Nausea and vomiting.  Weakness.  EXAM: ABDOMEN - 1 VIEW  COMPARISON:  CT, 01/04/2013   FINDINGS: Bowel gas pattern is unremarkable with no evidence of obstruction or generalized adynamic ileus.  Dialysis catheter curls in the low pelvis. There are aortic, splenic and iliac artery vascular calcifications. Soft tissues are otherwise unremarkable. Bony structures are demineralized with degenerative changes noted along the visible spine.  IMPRESSION: No acute findings.  No obstruction.   Electronically Signed   By: Lajean Manes M.D.   On: 08/16/2013 08:06     Antibiotics:  Ceftazadime - 6/22 --> current  Vancomycin - last dose 6/27  Code Status: DNR Family Communication: No family at bedside Disposition Plan: ? Hospice, PC consult placed.   HPI/Subjective: Sleepy when I saw her.  Complaining to nurse of some pain in her mouth and not wanting to take pills.  Also not wanting to eat.  No family at bedside when I saw her.   Objective: Filed Vitals:   08/16/13 1700 08/16/13 2202 08/17/13 0500 08/17/13 0559  BP: 76/48 82/42  106/65  Pulse: 87   85  Temp: 97.7 F (36.5 C) 98.3 F (36.8 C)  98.6 F (37 C)  TempSrc: Axillary Oral  Oral  Resp: 17 18  18   Height:      Weight:  112 lb 3.4 oz (50.9 kg) 109 lb 12.6 oz (49.8 kg)   SpO2: 92% 93%  94%    Intake/Output Summary (Last 24 hours) at 08/17/13 0902 Last data filed at 08/16/13 1817  Gross per 24 hour  Intake    200 ml  Output  0 ml  Net    200 ml    Exam:   General:  Pt is alert, answers questions  Cardiovascular: Regular rate and rhythm, S1/S2,   Respiratory: Clear to auscultation anteriorly, no wheezing  Abdomen: Soft, non tender, non distended, bowel sounds present, peritoneal dialysis catheter in place, + subQ hardware in LLQ  Extremities: minimal edema to bilateral LE worse on left, + bandages to bilateral heels.    Data Reviewed: Basic Metabolic Panel:  Recent Labs Lab 08/16/13 0349  NA 135*  K 2.7*  CL 97  CO2 20  GLUCOSE 109*  BUN 15  CREATININE 4.42*  CALCIUM 7.2*  MG 1.4*  PHOS  4.6   Liver Function Tests:  Recent Labs Lab 08/16/13 0349  AST 24  ALT 13  ALKPHOS 82  BILITOT 0.3  PROT 4.8*  ALBUMIN 1.6*  CBC:  Recent Labs Lab 08/16/13 0032 08/16/13 0349 08/16/13 0914 08/17/13 0515  WBC 16.4* 16.1* 17.9* 23.0*  NEUTROABS  --  12.6*  --   --   HGB 9.4* 9.4* 10.2* 10.3*  HCT 29.5* 29.4* 32.3* 32.2*  MCV 89.1 89.4 90.2 89.9  PLT 216 246 204 220   Cardiac Enzymes:  Recent Labs Lab 08/16/13 0349 08/16/13 0914 08/16/13 1405  TROPONINI <0.30 <0.30 <0.30      Recent Labs Lab 08/16/13 0649 08/16/13 1113 08/16/13 1648 08/16/13 2206 08/17/13 0748  GLUCAP 100* 129* 150* 145* 174*    Recent Results (from the past 240 hour(s))  BODY FLUID CULTURE     Status: None   Collection Time    08/16/13  1:52 PM      Result Value Ref Range Status   Specimen Description PERITONEAL FLUID   Final   Special Requests 20ML FLUID   Final   Gram Stain     Final   Value: WBC PRESENT, PREDOMINANTLY MONONUCLEAR     NO ORGANISMS SEEN     Performed at [facility]   Culture PENDING   Incomplete   Report Status PENDING   Incomplete     Scheduled Meds: . amiodarone  100 mg Oral Daily  . aspirin  81 mg Oral Daily  . cefTAZidime (FORTAZ)  IV  500 mg Intravenous Q24H  . docusate sodium  100 mg Oral BID  . feeding supplement (NEPRO CARB STEADY)  237 mL Oral Q1500  . insulin aspart  0-5 Units Subcutaneous QHS  . insulin aspart  0-9 Units Subcutaneous TID WC  . levothyroxine  75 mcg Oral QAC breakfast  . megestrol  400 mg Oral Daily  . midodrine  10 mg Oral TID WC  . mirtazapine  15 mg Oral QHS  . pantoprazole  40 mg Oral Daily  . rOPINIRole  0.25 mg Oral QHS  . sevelamer carbonate  2,400 mg Oral TID WC  . sodium chloride  3 mL Intravenous Q12H  . sodium chloride  3 mL Intravenous Q12H  . warfarin  2 mg Oral q1800  . Warfarin - Pharmacist Dosing Inpatient   Does not apply q1800   Continuous Infusions: . dialysis solution 1.5% low-MG/low-CA    .  heparin 500 Units/hr (08/16/13 2241)     Gilles Chiquito, MD  [place] Pager [phone]  If 7PM-7AM, please contact night-coverage [email or web address] Password [email or web address] 08/17/2013, 9:02 AM   LOS: 2 days

## 2013-08-17 NOTE — Progress Notes (Signed)
[facility] Progress Note   Subjective: Very sleepy, was not like this at home per dtr, no sob or acute complaints  Filed Vitals:   08/16/13 2202 08/17/13 0500 08/17/13 0559 08/17/13 1029  BP: 82/42  106/65 71/47  Pulse:   85 90  Temp: 98.3 F (36.8 C)  98.6 F (37 C) 97 F (36.1 C)  TempSrc: Oral  Oral Oral  Resp: 18  18 17   Height:      Weight: 50.9 kg (112 lb 3.4 oz) 49.8 kg (109 lb 12.6 oz)    SpO2: 93%  94%    Exam: Elderly frail WF, somnolent, arousable No jvd  Chest clear bilat, no rales rhonchi or wheezing  RRR no MRG Abd mo distension, PD cath exit site is clean and dry, nontender +BS  Left LE edema 2+ pitting leg, no edema RLE  Neuro is confused, nonfocal, +asterixis and lethargic   PD: Per dtr does the cycler at nights 4 exchanges then a day bag. She talked with PD dept recently and they talked about moving away from the cycler cause it might be too hard on her and doing instead 2 or 3 manual exchanges per day. She was on 2250 cc at night and 750 cc for the daybag. Using all 1.5% due to low BP's  EPO 10K weekly   Assessment:  1 L leg acute DVT - on IV hep, per primary  2 ESRD on PD - have switched to CAPD 3 exchanges per day 3 Recurrent peritonitis- cx negative this time, cont IV abx (vanc, fortaz) for 2 weeks total thru 7/5 4 Afib on amiodarone  5 Anemia on EPO  6 Chronic hypotension on midodrine  7 HPTH cont binders  8 DM not on medication 9 Lethargy / AMS- i will stop the Remeron, Requip and prn Reglan; dtr is not sure that she is getting these at home, and will bring in home meds tomorrow 10 Palliative care - family is meeting w PC team later today  Plan- stop potentially sedating meds, cont Vicodin but this may need to be held as well depending on response. Cont PD TID    Kelly Splinter MD  pager [phone]    cell [phone]  08/17/2013, 12:55 PM     Recent Labs Lab 08/16/13 0349  NA 135*  K 2.7*  CL 97  CO2 20  GLUCOSE 109*   BUN 15  CREATININE 4.42*  CALCIUM 7.2*  PHOS 4.6    Recent Labs Lab 08/16/13 0349  AST 24  ALT 13  ALKPHOS 82  BILITOT 0.3  PROT 4.8*  ALBUMIN 1.6*    Recent Labs Lab 08/16/13 0349 08/16/13 0914 08/17/13 0515  WBC 16.1* 17.9* 23.0*  NEUTROABS 12.6*  --   --   HGB 9.4* 10.2* 10.3*  HCT 29.4* 32.3* 32.2*  MCV 89.4 90.2 89.9  PLT 246 204 220   . amiodarone  100 mg Oral Daily  . aspirin  81 mg Oral Daily  . cefTAZidime (FORTAZ)  IV  500 mg Intravenous Q24H  . docusate sodium  100 mg Oral BID  . feeding supplement (NEPRO CARB STEADY)  237 mL Oral Q1500  . insulin aspart  0-5 Units Subcutaneous QHS  . insulin aspart  0-9 Units Subcutaneous TID WC  . levothyroxine  75 mcg Oral QAC breakfast  . megestrol  400 mg Oral Daily  . midodrine  10 mg Oral TID WC  . pantoprazole  40 mg Oral Daily  .  sevelamer carbonate  2,400 mg Oral TID WC  . sodium chloride  3 mL Intravenous Q12H  . sodium chloride  3 mL Intravenous Q12H  . warfarin  2 mg Oral q1800  . Warfarin - Pharmacist Dosing Inpatient   Does not apply q1800   . dialysis solution 1.5% low-MG/low-CA    . heparin 500 Units/hr (08/16/13 2241)   sodium chloride, acetaminophen, acetaminophen, gabapentin, HYDROcodone-acetaminophen, magic mouthwash, ondansetron (ZOFRAN) IV, ondansetron, sodium chloride

## 2013-08-18 LAB — RENAL FUNCTION PANEL
ALBUMIN: 1.2 g/dL — AB (ref 3.5–5.2)
Anion gap: 21 — ABNORMAL HIGH (ref 5–15)
BUN: 16 mg/dL (ref 6–23)
CHLORIDE: 92 meq/L — AB (ref 96–112)
CO2: 19 mEq/L (ref 19–32)
Calcium: 7.4 mg/dL — ABNORMAL LOW (ref 8.4–10.5)
Creatinine, Ser: 4.62 mg/dL — ABNORMAL HIGH (ref 0.50–1.10)
GFR, EST AFRICAN AMERICAN: 9 mL/min — AB (ref >90)
GFR, EST NON AFRICAN AMERICAN: 8 mL/min — AB (ref >90)
Glucose, Bld: 130 mg/dL — ABNORMAL HIGH (ref 70–99)
Phosphorus: 3.3 mg/dL (ref 2.3–4.6)
Potassium: 2.2 mEq/L — CL (ref 3.7–5.3)
Sodium: 132 mEq/L — ABNORMAL LOW (ref 137–147)

## 2013-08-18 LAB — CBC
HCT: 32.8 % — ABNORMAL LOW (ref 36.0–46.0)
Hemoglobin: 10.4 g/dL — ABNORMAL LOW (ref 12.0–15.0)
MCH: 28.5 pg (ref 26.0–34.0)
MCHC: 31.7 g/dL (ref 30.0–36.0)
MCV: 89.9 fL (ref 78.0–100.0)
PLATELETS: 228 10*3/uL (ref 150–400)
RBC: 3.65 MIL/uL — AB (ref 3.87–5.11)
RDW: 18.8 % — ABNORMAL HIGH (ref 11.5–15.5)
WBC: 25.9 10*3/uL — ABNORMAL HIGH (ref 4.0–10.5)

## 2013-08-18 LAB — PROTIME-INR
INR: 1.4 (ref 0.00–1.49)
PROTHROMBIN TIME: 17.2 s — AB (ref 11.6–15.2)

## 2013-08-18 LAB — HEPARIN LEVEL (UNFRACTIONATED)
HEPARIN UNFRACTIONATED: 0.27 [IU]/mL — AB (ref 0.30–0.70)
Heparin Unfractionated: 0.42 IU/mL (ref 0.30–0.70)

## 2013-08-18 LAB — GLUCOSE, CAPILLARY
Glucose-Capillary: 165 mg/dL — ABNORMAL HIGH (ref 70–99)
Glucose-Capillary: 148 mg/dL — ABNORMAL HIGH (ref 70–99)
Glucose-Capillary: 132 mg/dL — ABNORMAL HIGH (ref 70–99)
Glucose-Capillary: 142 mg/dL — ABNORMAL HIGH (ref 70–99)
Glucose-Capillary: 136 mg/dL — ABNORMAL HIGH (ref 70–99)

## 2013-08-18 MED ORDER — SODIUM CHLORIDE 0.9 % IV BOLUS (SEPSIS): 250.0000 mL | Freq: Once | INTRAVENOUS | Status: DC

## 2013-08-18 MED ORDER — POTASSIUM CHLORIDE CRYS ER 20 MEQ PO TBCR
20.0000 meq | EXTENDED_RELEASE_TABLET | Freq: Two times a day (BID) | ORAL | Status: DC
  Administered 2013-08-18: 20 meq via ORAL
  Filled 2013-08-18 (×5): qty 1

## 2013-08-18 MED ORDER — WARFARIN SODIUM 2.5 MG PO TABS
2.5000 mg | ORAL_TABLET | Freq: Once | ORAL | Status: DC
  Filled 2013-08-18: qty 1

## 2013-08-18 MED ORDER — MAGIC MOUTHWASH W/LIDOCAINE
5.0000 mL | Freq: Three times a day (TID) | ORAL | Status: DC | PRN
  Administered 2013-08-18 – 2013-08-20 (×2): 5 mL via ORAL
  Filled 2013-08-18 (×2): qty 5

## 2013-08-18 NOTE — Consult Note (Signed)
Palliative Medicine Team Consult Note  80 yo with ESRD on peritoneal dialysis, significant debility, malnutrition, and A. Fib admitted with acute left leg pain and found to have an occlusive DVT. She has been living at home with her son and DIL, they have been providing her care including managing her PD. Hospice evaluated this patient recently, but she was ineligible due to desire for ongoing PD and ESRD is her primary EOL diagnosis.  Met with patient, her son and her DIL to discuss goals of care.  Summary of Goals:   DNR  Goals is comfort, control of her leg pain and QOL  Patient and family understand the severity of her current condition-she is requiring increased amounts of pain medication leading to sedation and worsening hypotension, she has minimal PO intake and severe malnutrition  Patient told me she was ready to "meet Jesus" taht she was ok with dying, she was worried about her family, and remains non-committal on stopping PD  Patient does not want to die in the hospital-she desires a home death  I spoke with [employer] discussed her case AT LENGTH and really the only possible hospice diagnosis is ESRD and Hospice does not cover PD- this is the main issue, she clearly needs hospice and may be actively dying  Family have clearly stated they will continue PD until patient says to stop-but I am not sure how much longer Sebastiana will be able to maintain her mental clarity given her situation.  They are asking for pain control- I provided education that it may be impossible to control her pain from the occlusive DVT in her left leg without causing at least some sedation.  Symptom Recs:  I recommend use IV Dilaudid low doses for her pain while in hospital and we can transition to a Sublingual form of dilaudid at discharge which is not available inpatient.  Disposition:  Family open to hospice and I would not feel comfortable with her going home without the supervision of  the hospice team to manage her pain and EOL issues, but PD is going to prevent her from getting this service- family can opt to pay out of pocket for the PD, but hospice will not cover.  Time: 70 minutes 3PM-4:10PM Greater than 50%  of this time was spent counseling and coordinating care related to the above assessment and plan.   Lane Hacker, DO Palliative Medicine [phone]

## 2013-08-18 NOTE — Progress Notes (Signed)
ANTICOAGULATION CONSULT NOTE - Follow Up Consult  Pharmacy Consult for heparin Indication: DVT  Labs:  Recent Labs  08/16/13 0349 08/16/13 0914 08/16/13 1405 08/16/13 2153 08/17/13 0515 08/18/13 0446  HGB 9.4* 10.2*  --   --  10.3* 10.4*  HCT 29.4* 32.3*  --   --  32.2* 32.8*  PLT 246 204  --   --  220 228  LABPROT 16.0*  --   --   --   --  17.2*  INR 1.28  --   --   --   --  1.40  HEPARINUNFRC  --  1.06*  --  0.83* 0.50 0.27*  CREATININE 4.42*  --   --   --   --   --   TROPONINI <0.30 <0.30 <0.30  --   --   --     Assessment: 80yo female now slightly subtherapeutic on heparin for DVT after one level at goal.  Goal of Therapy:  Heparin level 0.3-0.7 units/ml   Plan:  Will increase heparin gtt slightly to 550 units/hr and check level in [place], PharmD, BCPS  08/18/2013,5:52 AM

## 2013-08-18 NOTE — Progress Notes (Signed)
[facility] Progress Note   Subjective: Very sleepy, was not like this at home per dtr, no sob or acute complaints  Filed Vitals:   08/17/13 2143 08/18/13 0500 08/18/13 0551 08/18/13 0910  BP: 84/54 71/50  81/51  Pulse: 68 81  81  Temp: 98.8 F (37.1 C) 97.8 F (36.6 C)  97.6 F (36.4 C)  TempSrc: Axillary Axillary  Oral  Resp: 12 16  17   Height:      Weight:  53 kg (116 lb 13.5 oz) 52.1 kg (114 lb 13.8 oz)   SpO2: 98% 98%  97%   Exam: Elderly frail WF, somnolent, arousable No jvd  Chest clear bilat, no rales rhonchi or wheezing  RRR no MRG Abd mo distension, PD cath exit site is clean and dry, nontender +BS  Left LE edema 2+ pitting leg, no edema RLE  Neuro is confused, nonfocal, +asterixis and lethargic   PD: Per dtr does the cycler at nights 4 exchanges then a day bag. She talked with PD dept recently and they talked about moving away from the cycler cause it might be too hard on her and doing instead 2 or 3 manual exchanges per day. She was on 2250 cc at night and 750 cc for the daybag. Using all 1.5% due to low BP's  EPO 10K weekly   Assessment:  1 L leg acute DVT - on IV hep/coumadin, per primary  2 ESRD on PD - have switched to CAPD 3 exchanges per day 3 Recurrent peritonitis- cx negative this time, cont IV abx (vanc, fortaz) for 2 weeks total thru 7/5 4 Afib on amiodarone  5 Anemia on EPO  6 Chronic hypotension on midodrine  7 HPTH is off of binders (renvela) and doing well, have dc'd 8 DM not on medication 9 Lethargy / AMS- better today, have stopped several meds she was not taking at home 10 Palliative care - met w family yest 11 Hypokalemia- low on 6/2, repeat today and replace, resume home KCl 20 bid  Plan- there are a number of medications on the PTA med list which family is no longer giving her; I've updated the PTA med list to reflect what she is getting, prn or not prn, etc.  Doing better overall.  Family not ready to stop dialysis but  understand pt's limited life expectancy and hold comfort and QOL as a priority, they would like to avoid aggressive procedures, medications w high side effect profile, etc.      Kelly Splinter MD  pager [phone]    cell [phone]  08/18/2013, 1:27 PM     Recent Labs Lab 08/16/13 0349  NA 135*  K 2.7*  CL 97  CO2 20  GLUCOSE 109*  BUN 15  CREATININE 4.42*  CALCIUM 7.2*  PHOS 4.6    Recent Labs Lab 08/16/13 0349  AST 24  ALT 13  ALKPHOS 82  BILITOT 0.3  PROT 4.8*  ALBUMIN 1.6*    Recent Labs Lab 08/16/13 0349 08/16/13 0914 08/17/13 0515 08/18/13 0446  WBC 16.1* 17.9* 23.0* 25.9*  NEUTROABS 12.6*  --   --   --   HGB 9.4* 10.2* 10.3* 10.4*  HCT 29.4* 32.3* 32.2* 32.8*  MCV 89.4 90.2 89.9 89.9  PLT 246 204 220 228   . amiodarone  100 mg Oral Daily  . aspirin  81 mg Oral Daily  . cefTAZidime (FORTAZ)  IV  500 mg Intravenous Q24H  . chlorhexidine  15 mL Mouth/Throat BID  .  docusate sodium  100 mg Oral BID  . feeding supplement (NEPRO CARB STEADY)  237 mL Oral Q1500  . insulin aspart  0-5 Units Subcutaneous QHS  . insulin aspart  0-9 Units Subcutaneous TID WC  . levothyroxine  75 mcg Oral QAC breakfast  . midodrine  10 mg Oral TID WC  . pantoprazole  40 mg Oral Daily  . sevelamer carbonate  2,400 mg Oral TID WC  . sodium chloride  3 mL Intravenous Q12H  . sodium chloride  3 mL Intravenous Q12H  . Warfarin - Pharmacist Dosing Inpatient   Does not apply q1800   . dialysis solution 1.5% low-MG/low-CA    . heparin 550 Units/hr (08/18/13 0553)   sodium chloride, acetaminophen, acetaminophen, gabapentin, HYDROcodone-acetaminophen, HYDROmorphone (DILAUDID) injection, magic mouthwash w/lidocaine, ondansetron (ZOFRAN) IV, ondansetron, sodium chloride

## 2013-08-18 NOTE — Progress Notes (Signed)
Patient BP 81/51. Dr. Daryll Drown notified; no new orders.  Will continue to monitor.

## 2013-08-18 NOTE — Progress Notes (Signed)
[facility] PROGRESS NOTE  Valerie Yoder VVO:[id number] DOB: 10/12/1930 DOA: 08/15/2013 PCP: Laverna Peace, NP  Brief narrative: Valerie Yoder is an 80yo woman with PMH Of ESRD on PD, HTN, DM, afib, and now chronic hypotension.  She was admitted previously for AMS and peritonitis.  She apparently has been struggling with recurrent peritonitis, but cannot have her access changed due to high risk.  She is not a candidate for HD given hypotension.  She is DNR.  She is now admitted for new DVT and is on heparin and coumadin.  Please see Nephrology note from 08/16/13 for details about her peritonitis.   Principal Problem: DVT (deep venous thrombosis) - On heparin and coumadin per pharmacy - INR today 1.4 - Heparin as per pharmacy - Leg pain due to swelling, she is getting hydrocodone and dilaudid prn  Active Problems: ESRD on peritoneal dialysis - PD daily - PC met with patient yesterday and family and they confirm continuation of PD  Peritonitis - Treating with Abx, Ceftazadime  - Patient with recurrent issues with this and has been on ceftazadime since 6/22. - Renal following along as well.    Diastolic dysfunction-grade 1/CAD and Chronic hypotension with Atrial fibrillation - Continue aspirin and amiodarone - BP chronically low and continues to be low this AM - Continue midodrine  Diabetes mellitus - ISS - CBG range from 140s-170s  Protein-calorie malnutrition, severe - Regular diet with fluid restriction    Consultants:  PC  Renal  Nutrition  Procedures/Studies: Dg Abd 1 View  08/16/2013   CLINICAL DATA:  Nausea and vomiting.  Weakness.  EXAM: ABDOMEN - 1 VIEW  COMPARISON:  CT, 01/04/2013  FINDINGS: Bowel gas pattern is unremarkable with no evidence of obstruction or generalized adynamic ileus.  Dialysis catheter curls in the low pelvis. There are aortic, splenic and iliac artery vascular calcifications. Soft tissues are otherwise unremarkable. Bony structures are  demineralized with degenerative changes noted along the visible spine.  IMPRESSION: No acute findings.  No obstruction.   Electronically Signed   By: Lajean Manes M.D.   On: 08/16/2013 08:06    Antibiotics:  Ceftazadime - 6/22 --> current  Vancomycin - last dose 6/27  Code Status: DNR Family Communication: No family at bedside, patient alert and we spoke Disposition Plan: home when ready, limited by blood pressure  HPI/Subjective: Awake and alert when I saw her.  She notes pain her left leg where DVT is and difficulty moving because of the pain.  She states "I know I am in bad shape" and is aware of the difficulties in discharging to home at this time.    Objective: Filed Vitals:   08/17/13 1628 08/17/13 2143 08/18/13 0500 08/18/13 0551  BP: _0   Pulse: 91 68 81   Temp: 97.3 F (36.3 C) 98.8 F (37.1 C) 97.8 F (36.6 C)   TempSrc: Oral Axillary Axillary   Resp: _1 Height:      Weight:   116 lb 13.5 oz (53 kg) 114 lb 13.8 oz (52.1 kg)  SpO2:  98% 98%     Intake/Output Summary (Last 24 hours) at 08/18/13 0745 Last data filed at 08/17/13 1818  Gross per 24 hour  Intake    110 ml  Output      0 ml  Net    110 ml    Exam:   General:  Pt is alert, appropriate and answering questions.   Cardiovascular: Regular rate and rhythm, S1/S2  noted   Respiratory: Clear to auscultation anteriorly, no wheezing  Abdomen: Soft, NT/ND, bowel sounds present, peritoneal dialysis catheter in place, + subQ hardware in LLQ  Extremities: + edema to LLE with erythema and pain to palpation.  She is able to move both LE, but with increased pain and difficulty on the left.    Data Reviewed: Basic Metabolic Panel:  Recent Labs Lab 08/16/13 0349  NA 135*  K 2.7*  CL 97  CO2 20  GLUCOSE 109*  BUN 15  CREATININE 4.42*  CALCIUM 7.2*  MG 1.4*  PHOS 4.6   Liver Function Tests:  Recent Labs Lab 08/16/13 0349  AST 24  ALT 13  ALKPHOS 82  BILITOT 0.3   PROT 4.8*  ALBUMIN 1.6*  CBC:  Recent Labs Lab 08/16/13 0032 08/16/13 0349 08/16/13 0914 08/17/13 0515 08/18/13 0446  WBC 16.4* 16.1* 17.9* 23.0* 25.9*  NEUTROABS  --  12.6*  --   --   --   HGB 9.4* 9.4* 10.2* 10.3* 10.4*  HCT 29.5* 29.4* 32.3* 32.2* 32.8*  MCV 89.1 89.4 90.2 89.9 89.9  PLT 216 246 204 220 228   Cardiac Enzymes:  Recent Labs Lab 08/16/13 0349 08/16/13 0914 08/16/13 1405  TROPONINI <0.30 <0.30 <0.30      Recent Labs Lab 08/16/13 2206 08/17/13 0748 08/17/13 1153 08/17/13 1757 08/17/13 2133  GLUCAP 145* 174* 147* 153* 165*    Recent Results (from the past 240 hour(s))  BODY FLUID CULTURE     Status: None   Collection Time    08/16/13  1:52 PM      Result Value Ref Range Status   Specimen Description PERITONEAL FLUID   Final   Special Requests 20ML FLUID   Final   Gram Stain     Final   Value: WBC PRESENT, PREDOMINANTLY MONONUCLEAR     NO ORGANISMS SEEN     Performed at [facility]   Culture     Final   Value: NO GROWTH     Performed at [facility]   Report Status PENDING   Incomplete     Scheduled Meds: . amiodarone  100 mg Oral Daily  . aspirin  81 mg Oral Daily  . cefTAZidime (FORTAZ)  IV  500 mg Intravenous Q24H  . chlorhexidine  15 mL Mouth/Throat BID  . docusate sodium  100 mg Oral BID  . feeding supplement (NEPRO CARB STEADY)  237 mL Oral Q1500  . insulin aspart  0-5 Units Subcutaneous QHS  . insulin aspart  0-9 Units Subcutaneous TID WC  . levothyroxine  75 mcg Oral QAC breakfast  . magic mouthwash  5 mL Oral QID  . midodrine  10 mg Oral TID WC  . pantoprazole  40 mg Oral Daily  . sevelamer carbonate  2,400 mg Oral TID WC  . sodium chloride  3 mL Intravenous Q12H  . sodium chloride  3 mL Intravenous Q12H  . Warfarin - Pharmacist Dosing Inpatient   Does not apply q1800   Continuous Infusions: . dialysis solution 1.5% low-MG/low-CA    . heparin 550 Units/hr (08/18/13 0553)     Gilles Chiquito,  MD  [place] Pager [phone]  If 7PM-7AM, please contact night-coverage [email or web address] Password [email or web address] 08/18/2013, 7:45 AM   LOS: 3 days

## 2013-08-18 NOTE — Progress Notes (Signed)
Patient BP 69/35.  HR 70.  SpO2 100%.  Patient lethargic and unable to finish sentences.  MD notified.  MD stated she will call family, no new orders.  Will continue to monitor.

## 2013-08-18 NOTE — Progress Notes (Signed)
CRITICAL VALUE ALERT  Critical value received:  Potassium 2.2  Date of notification:  08/18/2013  Time of notification:  1940  Critical value read back:Yes.    Nurse who received alert:  Wynell Balloon RN  Time of first page: 1940  MD notified (2nd page): D. Fisher  Responding MD:  D. Fisher  Time MD responded:  1943  Per MD, no new orders at this time. [name]

## 2013-08-18 NOTE — Progress Notes (Signed)
Palliative Medicine Team Progress Note  Sleeping, just received IV Dilaudid low dose. Family not at bedside but I spoke with them by phone after discussing case with Dr. Daryll Drown. Patient very hypotensive 66/36. Family aware of how fragile she is and that she may have a hospital death, they want her comfortable but when possible not overly sedated-may be difficult to achieve this-they want to continue PD and IV antibiotics indefinitely. They understand they cannot get hospice and PD. They want to take her home with home health, IV antibiotics, and continued PD and are requesting education on how to administer pain medications and deliver comfort care without formally enrolling in hospice.   Continue PRN IV dilaudid, transition to SL Dilaudid at discharge, I will assist with script info and provide education  [facility]- we could place a referral for out patient palliative to assist them without enrolling her in Hospice care.  Home health to assist with IV antibiotic infusion, labs, in home needs.  PD clinic following for in home PD.  Will need an ambulance transfer home    Time: 5:30-6:30 60 minutes Greater than 50%  of this time was spent counseling and coordinating care related to the above assessment and plan.   Spoke at length with both her son and daughter-will follow closely.   Lane Hacker, DO Palliative Medicine

## 2013-08-18 NOTE — Progress Notes (Addendum)
ANTICOAGULATION CONSULT NOTE - Follow Up Consult  Pharmacy Consult for heparin Indication: left leg acute DVT   Allergies  Allergen Reactions  . Latex     Made me sick   . Other     Pneumonia shot   . Penicillins     Swelling   . Pneumococcal Vaccines     Arms swelling   . Sulfa Antibiotics     Sick on my stomach     Patient Measurements: Height: 5\' 2"  (157.5 cm) Weight: 114 lb 13.8 oz (52.1 kg) (with dwell) IBW/kg (Calculated) : 50.1 Heparin Dosing Weight: 52.1 kg  Vital Signs: Temp: 97.6 F (36.4 C) (07/04 0910) Temp src: Oral (07/04 0910) BP: 81/51 mmHg (07/04 0910) Pulse Rate: 81 (07/04 0910)   Labs:  Recent Labs  08/16/13 0349 08/16/13 0914 08/16/13 1405  08/17/13 0515 08/18/13 0446 08/18/13 1330  HGB 9.4* 10.2*  --   --  10.3* 10.4*  --   HCT 29.4* 32.3*  --   --  32.2* 32.8*  --   PLT 246 204  --   --  220 228  --   LABPROT 16.0*  --   --   --   --  17.2*  --   INR 1.28  --   --   --   --  1.40  --   HEPARINUNFRC  --  1.06*  --   < > 0.50 0.27* 0.42  CREATININE 4.42*  --   --   --   --   --   --   TROPONINI <0.30 <0.30 <0.30  --   --   --   --   < > = values in this interval not displayed.  Assessment: 80yo female with ESRD on peritoneal dialysis and peritonitis. Anticoagulation: Heparin/coumadin, overlap day#3 for new left leg acute  DVT. Now with therapeutic heparin level (=0.42) on heparin infusion at 550 units/hr.  INR = 1.4 after 2 days of coumadin. INR increasing toward goal 2-3. CBC stable.  No bleeding noted.  LFTs within normal, Albumin low at 1.6.   H/o afib on amiodarone -continued.   Goal of Therapy:  Heparin level 0.3-0.7 units/ml   Plan:  Continue heparin gtt slightly to 550 units/hr Coumadin 2.5 mg po today x1 Daily AM heparin level, INR and CBC.   Nicole Cella, RPh Clinical Pharmacist Pager: [phone] 08/18/2013,2:22 PM

## 2013-08-19 LAB — PROTIME-INR
INR: 1.93 — AB (ref 0.00–1.49)
PROTHROMBIN TIME: 22.1 s — AB (ref 11.6–15.2)

## 2013-08-19 LAB — MAGNESIUM: Magnesium: 1.3 mg/dL — ABNORMAL LOW (ref 1.5–2.5)

## 2013-08-19 LAB — BASIC METABOLIC PANEL
Anion gap: 21 — ABNORMAL HIGH (ref 5–15)
BUN: 16 mg/dL (ref 6–23)
CALCIUM: 7.5 mg/dL — AB (ref 8.4–10.5)
CO2: 18 meq/L — AB (ref 19–32)
Chloride: 93 mEq/L — ABNORMAL LOW (ref 96–112)
Creatinine, Ser: 4.52 mg/dL — ABNORMAL HIGH (ref 0.50–1.10)
GFR calc Af Amer: 10 mL/min — ABNORMAL LOW (ref >90)
GFR calc non Af Amer: 8 mL/min — ABNORMAL LOW (ref >90)
GLUCOSE: 170 mg/dL — AB (ref 70–99)
Potassium: 2.7 mEq/L — CL (ref 3.7–5.3)
Sodium: 132 mEq/L — ABNORMAL LOW (ref 137–147)

## 2013-08-19 LAB — CBC
HCT: 30.1 % — ABNORMAL LOW (ref 36.0–46.0)
Hemoglobin: 9.8 g/dL — ABNORMAL LOW (ref 12.0–15.0)
MCH: 28.7 pg (ref 26.0–34.0)
MCHC: 32.6 g/dL (ref 30.0–36.0)
MCV: 88.3 fL (ref 78.0–100.0)
PLATELETS: 262 10*3/uL (ref 150–400)
RBC: 3.41 MIL/uL — ABNORMAL LOW (ref 3.87–5.11)
RDW: 18.7 % — AB (ref 11.5–15.5)
WBC: 25.3 10*3/uL — ABNORMAL HIGH (ref 4.0–10.5)

## 2013-08-19 LAB — HEPARIN LEVEL (UNFRACTIONATED): HEPARIN UNFRACTIONATED: 0.43 [IU]/mL (ref 0.30–0.70)

## 2013-08-19 LAB — BODY FLUID CULTURE: Culture: NO GROWTH

## 2013-08-19 LAB — GLUCOSE, CAPILLARY
Glucose-Capillary: 145 mg/dL — ABNORMAL HIGH (ref 70–99)
Glucose-Capillary: 157 mg/dL — ABNORMAL HIGH (ref 70–99)

## 2013-08-19 MED ORDER — POTASSIUM CHLORIDE CRYS ER 20 MEQ PO TBCR
40.0000 meq | EXTENDED_RELEASE_TABLET | Freq: Once | ORAL | Status: AC
  Administered 2013-08-19: 40 meq via ORAL
  Filled 2013-08-19: qty 2

## 2013-08-19 MED ORDER — LIDOCAINE 5 % EX PTCH
1.0000 | MEDICATED_PATCH | CUTANEOUS | Status: DC
  Administered 2013-08-19 – 2013-08-20 (×2): 1 via TRANSDERMAL
  Filled 2013-08-19 (×2): qty 1

## 2013-08-19 MED ORDER — POTASSIUM CHLORIDE CRYS ER 20 MEQ PO TBCR
40.0000 meq | EXTENDED_RELEASE_TABLET | Freq: Three times a day (TID) | ORAL | Status: AC
  Administered 2013-08-19 (×2): 40 meq via ORAL
  Filled 2013-08-19: qty 2

## 2013-08-19 MED ORDER — HYDROMORPHONE HCL PF 1 MG/ML IJ SOLN
0.2500 mg | INTRAMUSCULAR | Status: DC | PRN
  Administered 2013-08-19 – 2013-08-20 (×8): 0.25 mg via INTRAVENOUS
  Filled 2013-08-19 (×8): qty 1

## 2013-08-19 MED ORDER — HYDROMORPHONE HCL 2 MG PO TABS: 2.0000 mg | ORAL_TABLET | ORAL | Status: DC | PRN

## 2013-08-19 MED ORDER — WARFARIN SODIUM 1 MG PO TABS
1.0000 mg | ORAL_TABLET | Freq: Once | ORAL | Status: AC
  Administered 2013-08-19: 1 mg via ORAL
  Filled 2013-08-19: qty 1

## 2013-08-19 NOTE — Progress Notes (Addendum)
Gave patient scheduled midrorine and PRN norco with water. Pt unable to tolerate swallowing and vomited. Unable to address patient's pain due to inability to tolerate PO meds at this time. [name] MD of palliative care paged. Awaiting response. Oncoming staff made aware. Will continue to monitor. Valerie Yoder

## 2013-08-19 NOTE — Progress Notes (Signed)
[facility] Progress Note   Subjective: remains confused, BP's low in the 60's.    Filed Vitals:   08/18/13 1703 08/18/13 2100 08/19/13 0500 08/19/13 0752  BP: 69/35 76/49 68/36  62/36  Pulse: 70 78 89 91  Temp: 97.8 F (36.6 C) 97.8 F (36.6 C) 98.3 F (36.8 C) 98 F (36.7 C)  TempSrc: Oral Oral Oral Oral  Resp: 18 18 18 17   Height:      Weight:  53.4 kg (117 lb 11.6 oz)    SpO2: 100% 98% 100% 98%   Exam: Elderly frail WF, somnolent, arousable No jvd  Chest clear bilat, no rales rhonchi or wheezing  RRR no MRG Abd mo distension, PD cath exit site is clean and dry, nontender +BS  Left LE edema 2+ pitting leg, no edema RLE  Neuro is confused, nonfocal, +asterixis and lethargic   PD: Per dtr does the cycler at nights 4 exchanges then a day bag. She talked with PD dept recently and they talked about moving away from the cycler cause it might be too hard on her and doing instead 2 or 3 manual exchanges per day. She was on 2250 cc at night and 750 cc for the daybag. Using all 1.5% due to low BP's  EPO 10K weekly   Assessment:  1 L leg acute DVT - on IV hep/coumadin, per primary  2 ESRD on PD - have switched to CAPD 3 exchanges per day 3 Recurrent peritonitis- cx negative, completed course of vanc and fortaz on 7/5 today 4 Afib on amiodarone  5 Anemia on EPO  6 Chronic hypotension on midodrine  7 HPTH is off of binders (renvela) and doing well, have dc'd 8 DM not on medication 9 Lethargy / AMS- better today, have stopped several meds she was not taking at home 10 Palliative care - working with family 11 Hypokalemia- not taking all her KCl pills, K is lower today  Plan- continue CAPD with 3 exchanges per day.  Family /pt not ready to withdraw dialysis, however, pt is not doing well and this may be a moot point.  Daughter is realistic and does not want aggressive interventions. BP's remains in the 60-70's and mentation is poor and tends to go along with the BP.   Poor prognosis, appreciate care of [facility] and Palliative care team.    Kelly Splinter MD  pager [phone]    cell [phone]  08/19/2013, 11:20 AM     Recent Labs Lab 08/16/13 0349 08/18/13 1842  NA 135* 132*  K 2.7* 2.2*  CL 97 92*  CO2 20 19  GLUCOSE 109* 130*  BUN 15 16  CREATININE 4.42* 4.62*  CALCIUM 7.2* 7.4*  PHOS 4.6 3.3    Recent Labs Lab 08/16/13 0349 08/18/13 1842  AST 24  --   ALT 13  --   ALKPHOS 82  --   BILITOT 0.3  --   PROT 4.8*  --   ALBUMIN 1.6* 1.2*    Recent Labs Lab 08/16/13 0349  08/17/13 0515 08/18/13 0446 08/19/13 0740  WBC 16.1*  < > 23.0* 25.9* 25.3*  NEUTROABS 12.6*  --   --   --   --   HGB 9.4*  < > 10.3* 10.4* 9.8*  HCT 29.4*  < > 32.2* 32.8* 30.1*  MCV 89.4  < > 89.9 89.9 88.3  PLT 246  < > 220 228 262  < > = values in this interval not displayed. Marland Kitchen amiodarone  100 mg Oral Daily  . aspirin  81 mg Oral Daily  . cefTAZidime (FORTAZ)  IV  500 mg Intravenous Q24H  . chlorhexidine  15 mL Mouth/Throat BID  . feeding supplement (NEPRO CARB STEADY)  237 mL Oral Q1500  . insulin aspart  0-5 Units Subcutaneous QHS  . insulin aspart  0-9 Units Subcutaneous TID WC  . levothyroxine  75 mcg Oral QAC breakfast  . midodrine  10 mg Oral TID WC  . pantoprazole  40 mg Oral Daily  . potassium chloride  40 mEq Oral TID  . sodium chloride  3 mL Intravenous Q12H  . sodium chloride  3 mL Intravenous Q12H  . Warfarin - Pharmacist Dosing Inpatient   Does not apply q1800   . dialysis solution 1.5% low-MG/low-CA    . heparin 550 Units/hr (08/18/13 0553)   sodium chloride, acetaminophen, acetaminophen, gabapentin, HYDROcodone-acetaminophen, HYDROmorphone (DILAUDID) injection, magic mouthwash w/lidocaine, ondansetron (ZOFRAN) IV, ondansetron, sodium chloride

## 2013-08-19 NOTE — Progress Notes (Signed)
Patient refused 10:00 medications.  MD notified.

## 2013-08-19 NOTE — Progress Notes (Signed)
CRITICAL VALUE ALERT  Critical value received:  K+ 2.7  Date of notification:  08/19/13  Time of notification:  2020  Critical value read back:Yes.    Nurse who received alert:  J. Elisha Ponder RN  MD notified (1st page):  S. Newton  Time of first page:  2022  Responding MD:  Romona Curls MD  Time MD responded:  2026  New orders placed. Will continue to monitor pt. [name]

## 2013-08-19 NOTE — Plan of Care (Signed)
Problem: Phase III Progression Outcomes Goal: Activity at appropriate level-compared to baseline (UP IN CHAIR FOR HEMODIALYSIS)  Outcome: Not Progressing Pt is bed bound and in 10/10 pain in RLE with activity.

## 2013-08-19 NOTE — Progress Notes (Signed)
Son asked me to see his mother again - she was more alert- but having severe pain- she is having periods of lucidity between pain med doses. They are requesting to be discharged home tomorrow-they know this will be more difficult without Hospice but they want to use home health services for now and know how to call hospice when PD stops. I would also recommend referral to [facility] for palliative if they serve [facility]. Patient and family in agreement on plan.    D/C with:  Hydromorphone Liquid 1mg /ml sublingual- Give 1-2 ml under tongue every 2 hours PRN pain. Dispense: 431ml (1 pint)  May need to script for tablets if oral solution not available at pharmacy.  I provided education on administering pain medication at home.  Lane Hacker, DO Palliative Medicine

## 2013-08-19 NOTE — Progress Notes (Signed)
Patient's BP is 68/36 with complaints of severe 10/10 pain in right lower extremity. [name] MD consulted on pain med administration due to patient's low blood pressure. Awaiting call back. [name]

## 2013-08-19 NOTE — Progress Notes (Signed)
Palliative Medicine Team Progress Note  Patient mostly unresponsive. Her son is at bedside. I was very honest with him that his mother was dying and that I did not thinkl she would survive leaving teh hospital. Additionally I strongly advised against PD given her current state-when she is awake she has 10/10 pain. We should be using IV pain medication not PO-will discontinue oral pain meds. Vitals reviewed, left leg examined.   Comfort care- primary goal - patient having 10/10 pain  Son was at bedside I recommended stopping PD   Hospital death most likely-Im not sure she would survive transport home-risk would be up to family.  Continue IV Dilaudid for pain, stopped hydrocodone oral, added a lidoderm patch  Family seem to understand that she is actively dying- but remain not fully invested in stopping PD, antibiotics and other interventions. Will continue to support them in next steps. Following closely.  12:30-1PM (30 minutes total)  Greater than 50%  of this time was spent counseling and coordinating care related to the above assessment and plan.   Lane Hacker, DO Palliative Medicine [phone]

## 2013-08-19 NOTE — Progress Notes (Signed)
Patient IV out of date.  Patient a hard stick, followed by palliative with comfort measures requested by family.  MD states OK to leave current IV.  Will monitor for signs of infection or infiltration.

## 2013-08-19 NOTE — Progress Notes (Addendum)
ANTICOAGULATION CONSULT NOTE - Follow Up Consult  Pharmacy Consult for heparin Indication: left leg acute DVT   Allergies  Allergen Reactions  . Latex     Made me sick   . Other     Pneumonia shot   . Penicillins     Swelling   . Pneumococcal Vaccines     Arms swelling   . Sulfa Antibiotics     Sick on my stomach     Patient Measurements: Height: 5\' 2"  (157.5 cm) Weight: 114 lb 13.8 oz (52.1 kg) (with dwell) IBW/kg (Calculated) : 50.1 Heparin Dosing Weight: 52.1 kg  Vital Signs: Temp: 97.6 F (36.4 C) (07/04 0910) Temp src: Oral (07/04 0910) BP: 81/51 mmHg (07/04 0910) Pulse Rate: 81 (07/04 0910)   Labs:  Recent Labs  08/16/13 1405  08/17/13 0515 08/18/13 0446 08/18/13 1330 08/18/13 1842 08/19/13 0740  HGB  --   < > 10.3* 10.4*  --   --  9.8*  HCT  --   --  32.2* 32.8*  --   --  30.1*  PLT  --   --  220 228  --   --  262  LABPROT  --   --   --  17.2*  --   --  22.1*  INR  --   --   --  1.40  --   --  1.93*  HEPARINUNFRC  --   < > 0.50 0.27* 0.42  --  0.43  CREATININE  --   --   --   --   --  4.62*  --   TROPONINI <0.30  --   --   --   --   --   --   < > = values in this interval not displayed.  Assessment: 80yo female with ESRD on peritoneal dialysis and peritonitis. Anticoagulation: Heparin/coumadin, overlap day#4 for new left leg acute  DVT. Heparin level 0.43 remains therapeutic on heparindrip at 550 units/hr.  Coumadin not given last PM d/t patient lethargic, unsafe to swallow medications.   INR increased to 1.93 after 2 days of 2 mg then none given yesterday.  INR increasing toward goal 2-3. CBC ~stable with Hgb slight decrease 9.8, pltc stable.  No bleeding noted.  LFTs within normal, Albumin low at 1.6.   H/o afib on amiodarone -continued; can increase coumadin effect.    Goal of Therapy:  Heparin level 0.3-0.7 units/ml   Plan:  Continue heparin gtt slightly to 550 units/hr Coumadin 1 mg po today x1  (or IV  Coumadin is unavailabe) Daily AM  heparin level, INR and CBC.   Nicole Cella, RPh Clinical Pharmacist Pager: [phone] 08/19/2013,11:25 AM

## 2013-08-19 NOTE — Progress Notes (Signed)
[facility] PROGRESS NOTE  Valerie Yoder XBD:[id number] DOB: 09-14-1930 DOA: 08/15/2013 PCP: Laverna Peace, NP  Brief narrative: Valerie Yoder is an 80yo woman with PMH Of ESRD on PD, HTN, DM, afib, and now chronic hypotension.  She was admitted previously for AMS and peritonitis.  She apparently has been struggling with recurrent peritonitis, but cannot have her access changed due to high risk.  She is not a candidate for HD given hypotension.  She is DNR.  She is now admitted for new DVT and is on heparin and coumadin.  Please see Nephrology note from 08/16/13 for details about her peritonitis.   Principal Problem: DVT (deep venous thrombosis) - On heparin and coumadin per pharmacy - INR today 1.93, close to goal - Heparin as per pharmacy  Active Problems: ESRD on peritoneal dialysis - PD daily - PC met with patient and family and they confirm continuation of PD  Peritonitis - Treating with Abx, Ceftazadime continues - Vancomycin, however, dosing per level and per pharmacy  Diastolic dysfunction-grade 1/CAD and Chronic hypotension with Atrial fibrillation - Continue aspirin and amiodarone - BP chronically low  - Continue midodrine  Diabetes mellitus - ISS - CBG range from 130s-140s  Protein-calorie malnutrition, severe - Regular diet with fluid restriction to encourage PO intake  Pain control - PRN dilaudid and norco - Okay to give dilaudid with low blood pressure and if patient nauseated  Hypokalemia - patient with some difficulty swallowing pills (occasional nausea) and some somnolence, she was able to tolerate 1 tab of PO K yesterday evening.  - Will attempt oral K today, if not able to tolerate, will give IV - Repeat labs at 6pm tonight with mag.   Consultants:  PC  Renal  Nutrition  Procedures/Studies: No results found.  Antibiotics:  Ceftazadime - 6/22 --> current  Vancomycin - last dose 6/27, per pharmacy, she is currently on vancomycin, but levels  have precluded repeat dosing  Code Status: DNR Family Communication: Patient at bedside Disposition Plan: home when ready, will explore discharge home option once INR at goal, possibly tomorrow.   HPI/Subjective: Patient reports nausea and pain in leg this morning.  She is more somnolent than yesterday morning.  Spoke with Dr. [name] in [facility] yesterday and she spoke with patients family.    Objective: Filed Vitals:   08/18/13 1703 08/18/13 2100 08/19/13 0500 08/19/13 0752  BP: 69/35 76/49 68/36 62/36  Pulse: 70 78 89 91  Temp: 97.8 F (36.6 C) 97.8 F (36.6 C) 98.3 F (36.8 C) 98 F (36.7 C)  TempSrc: Oral Oral Oral Oral  Resp: _0 Height:      Weight:  117 lb 11.6 oz (53.4 kg)    SpO2: 100% 98% 100% 98%    Intake/Output Summary (Last 24 hours) at 08/19/13 0843 Last data filed at 08/18/13 1430  Gross per 24 hour  Intake     60 ml  Output      0 ml  Net     60 ml    Exam:   General:  Patient groggy, but answering questions, PD ongoing  Cardiovascular: Regular rate and rhythm, S1/S2 noted   Respiratory: Clear to auscultation anteriorly  Abdomen: Soft, NT/ND, bowel sounds present, peritoneal dialysis catheter in place, no redness or tenderness at site  Extremities: + edema to LLE with erythema and pain to palpation, unchanged, able to move LLE with some pain   Data Reviewed: Basic Metabolic Panel:  Recent Labs Lab 08/16/13 0349  08/18/13 1842  NA 135* 132*  K 2.7* 2.2*  CL 97 92*  CO2 20 19  GLUCOSE 109* 130*  BUN 15 16  CREATININE 4.42* 4.62*  CALCIUM 7.2* 7.4*  MG 1.4*  --   PHOS 4.6 3.3   Liver Function Tests:  Recent Labs Lab 08/16/13 0349 08/18/13 1842  AST 24  --   ALT 13  --   ALKPHOS 82  --   BILITOT 0.3  --   PROT 4.8*  --   ALBUMIN 1.6* 1.2*  CBC:  Recent Labs Lab 08/16/13 0349 08/16/13 0914 08/17/13 0515 08/18/13 0446 08/19/13 0740  WBC 16.1* 17.9* 23.0* 25.9* 25.3*  NEUTROABS 12.6*  --   --   --   --   HGB 9.4*  10.2* 10.3* 10.4* 9.8*  HCT 29.4* 32.3* 32.2* 32.8* 30.1*  MCV 89.4 90.2 89.9 89.9 88.3  PLT 246 204 220 228 262   Cardiac Enzymes:  Recent Labs Lab 08/16/13 0349 08/16/13 0914 08/16/13 1405  TROPONINI <0.30 <0.30 <0.30      Recent Labs Lab 08/17/13 2133 08/18/13 0758 08/18/13 1157 08/18/13 1702 08/18/13 2206  GLUCAP 165* 148* 132* 142* 136*    Recent Results (from the past 240 hour(s))  BODY FLUID CULTURE     Status: None   Collection Time    08/16/13  1:52 PM      Result Value Ref Range Status   Specimen Description PERITONEAL FLUID   Final   Special Requests 20ML FLUID   Final   Gram Stain     Final   Value: WBC PRESENT, PREDOMINANTLY MONONUCLEAR     NO ORGANISMS SEEN     Performed at [facility]   Culture     Final   Value: NO GROWTH 2 DAYS     Performed at [facility]   Report Status PENDING   Incomplete     Scheduled Meds: . amiodarone  100 mg Oral Daily  . aspirin  81 mg Oral Daily  . cefTAZidime (FORTAZ)  IV  500 mg Intravenous Q24H  . chlorhexidine  15 mL Mouth/Throat BID  . feeding supplement (NEPRO CARB STEADY)  237 mL Oral Q1500  . insulin aspart  0-5 Units Subcutaneous QHS  . insulin aspart  0-9 Units Subcutaneous TID WC  . levothyroxine  75 mcg Oral QAC breakfast  . midodrine  10 mg Oral TID WC  . pantoprazole  40 mg Oral Daily  . potassium chloride  40 mEq Oral TID  . sodium chloride  3 mL Intravenous Q12H  . sodium chloride  3 mL Intravenous Q12H  . warfarin  2.5 mg Oral ONCE-1800  . Warfarin - Pharmacist Dosing Inpatient   Does not apply q1800   Continuous Infusions: . dialysis solution 1.5% low-MG/low-CA    . heparin 550 Units/hr (08/18/13 0553)     Gilles Chiquito, MD  [place] Pager [phone]  If 7PM-7AM, please contact night-coverage [email or web address] Password [email or web address] 08/19/2013, 8:43 AM   LOS: 4 days

## 2013-08-20 DIAGNOSIS — I2584 Coronary atherosclerosis due to calcified coronary lesion: Secondary | ICD-10-CM

## 2013-08-20 DIAGNOSIS — I2510 Atherosclerotic heart disease of native coronary artery without angina pectoris: Secondary | ICD-10-CM

## 2013-08-20 LAB — CBC
HCT: 31 % — ABNORMAL LOW (ref 36.0–46.0)
Hemoglobin: 9.9 g/dL — ABNORMAL LOW (ref 12.0–15.0)
MCH: 28.4 pg (ref 26.0–34.0)
MCHC: 31.9 g/dL (ref 30.0–36.0)
MCV: 89.1 fL (ref 78.0–100.0)
PLATELETS: 278 10*3/uL (ref 150–400)
RBC: 3.48 MIL/uL — AB (ref 3.87–5.11)
RDW: 19.3 % — AB (ref 11.5–15.5)
WBC: 25.6 10*3/uL — AB (ref 4.0–10.5)

## 2013-08-20 LAB — PROTIME-INR
INR: 2.08 — AB (ref 0.00–1.49)
Prothrombin Time: 23.4 seconds — ABNORMAL HIGH (ref 11.6–15.2)

## 2013-08-20 LAB — HEPARIN LEVEL (UNFRACTIONATED): HEPARIN UNFRACTIONATED: 0.55 [IU]/mL (ref 0.30–0.70)

## 2013-08-20 LAB — PATHOLOGIST SMEAR REVIEW

## 2013-08-20 MED ORDER — NEPRO/CARBSTEADY PO LIQD: 237.0000 mL | Freq: Every day | ORAL | Status: AC

## 2013-08-20 MED ORDER — HYDROMORPHONE HCL 1 MG/ML PO LIQD: 1.0000 mL | ORAL | Status: AC | PRN

## 2013-08-20 MED ORDER — LIDOCAINE 5 % EX PTCH: 1.0000 | MEDICATED_PATCH | CUTANEOUS | Status: AC

## 2013-08-20 MED ORDER — WARFARIN SODIUM 1 MG PO TABS: 1.0000 mg | ORAL_TABLET | Freq: Once | ORAL | Status: AC

## 2013-08-20 MED ORDER — WARFARIN SODIUM 1 MG PO TABS
1.0000 mg | ORAL_TABLET | Freq: Once | ORAL | Status: DC
  Filled 2013-08-20: qty 1

## 2013-08-20 MED ORDER — POTASSIUM CHLORIDE CRYS ER 20 MEQ PO TBCR: 40.0000 meq | EXTENDED_RELEASE_TABLET | Freq: Three times a day (TID) | ORAL | Status: AC

## 2013-08-20 MED ORDER — MAGIC MOUTHWASH W/LIDOCAINE: 5.0000 mL | Freq: Three times a day (TID) | ORAL | Status: AC | PRN

## 2013-08-20 MED ORDER — HYDROMORPHONE HCL 1 MG/ML PO LIQD: 1.0000 mL | ORAL | Status: DC | PRN

## 2013-08-20 NOTE — Progress Notes (Signed)
ANTICOAGULATION CONSULT NOTE - Follow Up Consult  Pharmacy Consult for heparin and coumadin Indication: DVT  Allergies  Allergen Reactions  . Latex     Made me sick   . Other     Pneumonia shot   . Penicillins     Swelling   . Pneumococcal Vaccines     Arms swelling   . Sulfa Antibiotics     Sick on my stomach     Patient Measurements: Height: 5\' 2"  (157.5 cm) Weight: 118 lb 13.3 oz (53.9 kg) IBW/kg (Calculated) : 50.1 Heparin Dosing Weight: 52 kg  Vital Signs: Temp: 98 F (36.7 C) (07/06 0820) Temp src: Oral (07/06 0820) BP: 50/30 mmHg (07/06 0830) Pulse Rate: 89 (07/06 0820)  Labs:  Recent Labs  08/18/13 0446 08/18/13 1330 08/18/13 1842 08/19/13 0740 08/19/13 1920 08/20/13 0357  HGB 10.4*  --   --  9.8*  --  9.9*  HCT 32.8*  --   --  30.1*  --  31.0*  PLT 228  --   --  262  --  278  LABPROT 17.2*  --   --  22.1*  --  23.4*  INR 1.40  --   --  1.93*  --  2.08*  HEPARINUNFRC 0.27* 0.42  --  0.43  --  0.55  CREATININE  --   --  4.62*  --  4.52*  --     Estimated Creatinine Clearance: 7.7 ml/min (by C-G formula based on Cr of 4.52).  Assessment: Patient is an 80 y.o F on heparin and coumadin for LLE DVT.  Heparin and INR levels are at goal with 0.55 and 2.08, respectively.  CBC stable, no bleeding documented.  Goal of Therapy:  INR 2-3 Heparin level 0.3-0.7 units/ml Monitor platelets by anticoagulation protocol: Yes   Plan:  1) Cont heparin gtt 550 units/hr  2) Coumadin 1mg  po today   Shalini Mair P 08/20/2013,11:37 AM

## 2013-08-20 NOTE — Progress Notes (Signed)
CSW (Clinical Education officer, museum) made aware that pt will need non-emergent transport home. CSW spoke with pt daughter Lynelle Smoke and confirmed discharge address. CSW to arrange transport once notified that pt equipment has been delivered and nursing ready for discharge.  [place], [place]

## 2013-08-20 NOTE — Discharge Instructions (Addendum)
Information on my medicine - Coumadin   (Warfarin)  This medication education was reviewed with me or my healthcare representative as part of my discharge preparation.  The pharmacist that spoke with me during my hospital stay was:  Lynelle Doctor, [facility]  Why was Coumadin prescribed for you? Coumadin was prescribed for you because you have a blood clot or a medical condition that can cause an increased risk of forming blood clots. Blood clots can cause serious health problems by blocking the flow of blood to the heart, lung, or brain. Coumadin can prevent harmful blood clots from forming. As a reminder your indication for Coumadin is:  New deep vein thrombosis  What test will check on my response to Coumadin? While on Coumadin (warfarin) you will need to have an INR test regularly to ensure that your dose is keeping you in the desired range. The INR (international normalized ratio) number is calculated from the result of the laboratory test called prothrombin time (PT).  If an INR APPOINTMENT HAS NOT ALREADY BEEN MADE FOR YOU please schedule an appointment to have this lab work done by your health care provider within 7 days. Your INR goal is usually a number between:  2 to 3 or your provider may give you a more narrow range like 2-2.5.  Ask your health care provider during an office visit what your goal INR is.  What  do you need to  know  About  COUMADIN? Take Coumadin (warfarin) exactly as prescribed by your healthcare provider about the same time each day.  DO NOT stop taking without talking to the doctor who prescribed the medication.  Stopping without other blood clot prevention medication to take the place of Coumadin may increase your risk of developing a new clot or stroke.  Get refills before you run out.  What do you do if you miss a dose? If you miss a dose, take it as soon as you remember on the same day then continue your regularly scheduled regimen the next day.  Do not take two doses of  Coumadin at the same time.  Important Safety Information A possible side effect of Coumadin (Warfarin) is an increased risk of bleeding. You should call your healthcare provider right away if you experience any of the following:   Bleeding from an injury or your nose that does not stop.   Unusual colored urine (red or dark brown) or unusual colored stools (red or black).   Unusual bruising for unknown reasons.   A serious fall or if you hit your head (even if there is no bleeding).  Some foods or medicines interact with Coumadin (warfarin) and might alter your response to warfarin. To help avoid this:   Eat a balanced diet, maintaining a consistent amount of Vitamin K.   Notify your provider about major diet changes you plan to make.   Avoid alcohol or limit your intake to 1 drink for women and 2 drinks for men per day. (1 drink is 5 oz. wine, 12 oz. beer, or 1.5 oz. liquor.)  Make sure that ANY health care provider who prescribes medication for you knows that you are taking Coumadin (warfarin).  Also make sure the healthcare provider who is monitoring your Coumadin knows when you have started a new medication including herbals and non-prescription products.  Coumadin (Warfarin)  Major Drug Interactions  Increased Warfarin Effect Decreased Warfarin Effect  Alcohol (large quantities) Antibiotics (esp. Septra/Bactrim, Flagyl, Cipro) Amiodarone (Cordarone) Aspirin (ASA) Cimetidine (Tagamet)  Megestrol (Megace) NSAIDs (ibuprofen, naproxen, etc.) Piroxicam (Feldene) Propafenone (Rythmol SR) Propranolol (Inderal) Isoniazid (INH) Posaconazole (Noxafil) Barbiturates (Phenobarbital) Carbamazepine (Tegretol) Chlordiazepoxide (Librium) Cholestyramine (Questran) Griseofulvin Oral Contraceptives Rifampin Sucralfate (Carafate) Vitamin K   Coumadin (Warfarin) Major Herbal Interactions  Increased Warfarin Effect Decreased Warfarin Effect  Garlic Ginseng Ginkgo biloba Coenzyme Q10 Green  tea St. Johns wort    Coumadin (Warfarin) FOOD Interactions  Eat a consistent number of servings per week of foods HIGH in Vitamin K (1 serving =  cup)  Collards (cooked, or boiled & drained) Kale (cooked, or boiled & drained) Mustard greens (cooked, or boiled & drained) Parsley *serving size only =  cup Spinach (cooked, or boiled & drained) Swiss chard (cooked, or boiled & drained) Turnip greens (cooked, or boiled & drained)  Eat a consistent number of servings per week of foods MEDIUM-HIGH in Vitamin K (1 serving = 1 cup)  Asparagus (cooked, or boiled & drained) Broccoli (cooked, boiled & drained, or raw & chopped) Brussel sprouts (cooked, or boiled & drained) *serving size only =  cup Lettuce, raw (green leaf, endive, romaine) Spinach, raw Turnip greens, raw & chopped   These websites have more information on Coumadin (warfarin):  FailFactory.se; VeganReport.com.au;

## 2013-08-20 NOTE — Progress Notes (Signed)
Valerie Yoder discharged Home per MD order.  Discharge instructions reviewed and discussed with the patient['s son, all questions and concerns answered. Copy of instructions and scripts given to EMS.  Patient escorted to ambulance via stretcher,  no distress noted upon discharge.  [name] 08/20/2013 10:08 PM

## 2013-08-20 NOTE — Discharge Summary (Signed)
Physician Discharge Summary  [name] TML:[id number] DOB: Dec 08, 1930 DOA: 08/15/2013  PCP: Laverna Peace, NP  Admit date: 08/15/2013 Discharge date: 08/20/2013  Recommendations for Outpatient Follow-up:  1. Home health RN and aid 2. Family given information about hospice services so they can call if they have any problems at home. They understand that they would have to discontinue peritoneal dialysis to fully accept hospice care services.  Palliative care does not do home visits in their area.   3. INR, CBC, and BMP on Wednesday to be drawn by RN and called to [phone].  Discharge Diagnoses:  Principal Problem:   DVT (deep venous thrombosis) Active Problems:   ESRD on peritoneal dialysis   Peritonitis   Diastolic dysfunction-grade 1    Chronic hypotension   Atrial fibrillation   CAD (coronary artery disease)   Diabetes mellitus   Protein-calorie malnutrition, severe   Discharge Condition: Poor  Diet recommendation:  Regular  Wt Readings from Last 3 Encounters:  08/19/13 53.9 kg (118 lb 13.3 oz)  07/03/13 51.7 kg (113 lb 15.7 oz)  01/09/13 55.1 kg (121 lb 7.6 oz)    History of present illness:   Valerie Yoder is a 80 y.o. female  has a past medical history of Hypertension; Diabetes mellitus; Renal failure; Mitral valve prolapse; Hyperlipidemia; CHF (congestive heart failure); Atrial fibrillation; Arthritis; Cancer (07/02/11); and ESRD on peritoneal dialysis (07/15/2011).  Presented with  Patietn with complex medical Hx including ESRD on PD with recurrent peritonitis currently undergoing treatment with vancomycin and Fortaz. Patient per family has had poor appetite and severe debility. Inability to ambulate. 4 days ago she developed left leg pain. She presented to [facility] and eventually was diagnosed with large DVT. She was transferred to [facility] ER to be seen by vasilar surgery for possible thrombectomy. The patient has chronic debility, and hypotension and  her blood pressures running in the 70s- 80's. Plus to be not a candidate for operative intervention. Vascular surgery have seen the patient and recommended admission to medicine on heparin followed by by mouth anticoagulation. Patient's family is concerned that she has trouble swallowing pills due ongoing nausea and vomiting which they attributed to hair antibiotics.  Patient already has hospice set up she is DO NOT RESUSCITATE . Overall she has poor prognosis but discussed with patient and her family at this point she wishes to continue her peritoneal dialysis, antibiotics, and medications for blood clot as well as pain medications if possible. Of note patient has been given fentanyl and has became even more profoundly hypotensive. In ER she was given IV fluids 125 ml/h with some improvement in blood pressure but her abdomen became somewhat distended.  Family states her last dose of vancomycin was on Saturday 6/27 and 2 days ago her vancomycin level has been checked and was found to be 11.  Hospitalist was called for admission for DVT management  Of note patient has history of atrial fibrillation and and used to be on Coumadin but there was difficult to control which point she'll switch to Plavix.   Hospital Course:   Lethargy and actively dying. The patient was seen repeatedly by palliative care to discuss goals of care. The family is very realistic about the patient dying, however they feel very strongly about her continuing peritoneal dialysis despite her condition. Family feels strongly that she be allowed to die at home. They requested today go home today with with home health services. She did not qualify for hospice services because she  continues to use peritoneal dialysis.  The family understand that she may die during transport to home.    Left leg DVT, she was previously on Coumadin for atrial fibrillation however this had been discontinued because of difficulty getting her INR within range.  She was started on heparin and Coumadin, dosed by pharmacy. Her INR today is at goal between 2 and 3. Her heparin may be discontinued.  She will need an INR check on Wednesday which can be drawn by her home health nurse and  calls to the clinic at [phone].    ESRD on peritoneal dialysis , continuing peritoneal dialysis at the request of the family. The medical staff has recommended against continuing peritoneal dialysis due to her unstable condition and the feeling that continuing peritoneal dialysis is contributing to prolonged suffering. When awake, she continues to be in severe pain.  Peritonitis, culture negative. completed treatment with ceftazadime and vancomycin on 7/5.  afib, rate controlled, now resuming anticoagulation.  Continue amiodarone.  CAD and diastolic dysfunction.  Minimizing fluid removal with PD.  Continue home medications.  Hypotension, chronic but progressively worse during admission. She continued midodrine, however blood pressures at the time of discharge are 08-67 systolic.  Anticipate death within hours to days.  T2DM, not on medication.    Anemia and secondary hyperparathyroidism due to renal disease, managed by nephrology.    Protein-calorie malnutrition, severe   Leukocytosis due to underlying infection, rose and remained elevated.    Hypokalemia, increased oral potassium repletion.  Repeat BMP in 2 days.  Consultants:  Palliative care Nephrology Nutrition Procedures/Studies:  No results found. Antibiotics:  Ceftazadime - 6/22 --> 7/5  Vancomycin - through 7/5 Discharge Exam: Filed Vitals:   08/20/13 0830  BP: 50/30  Pulse:   Temp:   Resp:    Filed Vitals:   08/19/13 2136 08/20/13 0554 08/20/13 0820 08/20/13 0830  BP: 52/32 64/42  50/30  Pulse: 89 100 89   Temp: 98.2 F (36.8 C) 98 F (36.7 C) 98 F (36.7 C)   TempSrc: Oral Oral Oral   Resp: 18 18 16    Height:      Weight: 53.9 kg (118 lb 13.3 oz)     SpO2: 96% 93% 87%      General: Gray cachectic F, asleep but arousable and able to answer some questions with one word answers HEENT:  NCAT, MMM Cardiovascular:  RRR, no obvious murmurs Respiratory: CTAB anteriorly  ABD:  NABS, soft, mildly distended, TTP diffusely without rebound or guarding MSK:  1+ left LLE with dusky appearance to leg and TTP, RLE sans edema.    Discharge Instructions     Medication List    STOP taking these medications       gabapentin 100 MG capsule  Commonly known as:  NEURONTIN      TAKE these medications       amiodarone 200 MG tablet  Commonly known as:  PACERONE  Take 100 mg by mouth daily.     aspirin 81 MG chewable tablet  Chew 81 mg by mouth daily.     diclofenac sodium 1 % Gel  Commonly known as:  VOLTAREN  Apply 2 g topically 4 (four) times daily.     dicyclomine 20 MG tablet  Commonly known as:  BENTYL  Take 20 mg by mouth every 8 (eight) hours as needed for spasms (abd spasms).     epoetin alfa 10000 UNIT/ML injection  Commonly known as:  EPOGEN,PROCRIT  Inject 10,000 Units  into the skin once a week.     feeding supplement (NEPRO CARB STEADY) Liqd  Take 237 mLs by mouth daily at 3 pm.     HYDROmorphone HCl 1 MG/ML Liqd  Commonly known as:  DILAUDID  Take 1-2 mLs (1-2 mg total) by mouth every 2 (two) hours as needed for moderate pain or severe pain.     levothyroxine 75 MCG tablet  Commonly known as:  SYNTHROID, LEVOTHROID  Take 75 mcg by mouth daily before breakfast.     lidocaine 5 %  Commonly known as:  LIDODERM  Place 1 patch onto the skin daily. Remove & Discard patch within 12 hours or as directed by MD     magic mouthwash w/lidocaine Soln  Take 5 mLs by mouth 3 (three) times daily as needed for mouth pain.     midodrine 10 MG tablet  Commonly known as:  PROAMATINE  Take 1 tablet (10 mg total) by mouth 3 (three) times daily with meals.     ondansetron 4 MG tablet  Commonly known as:  ZOFRAN  Take 1 tablet (4 mg total) by mouth  every 6 (six) hours as needed for nausea.     potassium chloride SA 20 MEQ tablet  Commonly known as:  K-DUR,KLOR-CON  Take 2 tablets (40 mEq total) by mouth 3 (three) times daily.     rOPINIRole 0.25 MG tablet  Commonly known as:  REQUIP  Take 0.25 mg by mouth at bedtime as needed (for restless legs).     warfarin 1 MG tablet  Commonly known as:  COUMADIN  Take 1 tablet (1 mg total) by mouth one time only at 6 PM.       Follow-up Information   Follow up with [facility], NP. Schedule an appointment as soon as possible for a visit in 1 week.   Specialty:  Internal Medicine   Contact information:   [place], [place] [address] [phone]        The results of significant diagnostics from this hospitalization (including imaging, microbiology, ancillary and laboratory) are listed below for reference.    Significant Diagnostic Studies: Dg Abd 1 View  08/16/2013   CLINICAL DATA:  Nausea and vomiting.  Weakness.  EXAM: ABDOMEN - 1 VIEW  COMPARISON:  CT, 01/04/2013  FINDINGS: Bowel gas pattern is unremarkable with no evidence of obstruction or generalized adynamic ileus.  Dialysis catheter curls in the low pelvis. There are aortic, splenic and iliac artery vascular calcifications. Soft tissues are otherwise unremarkable. Bony structures are demineralized with degenerative changes noted along the visible spine.  IMPRESSION: No acute findings.  No obstruction.   Electronically Signed   By: Lajean Manes M.D.   On: 08/16/2013 08:06    Microbiology: Recent Results (from the past 240 hour(s))  BODY FLUID CULTURE     Status: None   Collection Time    08/16/13  1:52 PM      Result Value Ref Range Status   Specimen Description PERITONEAL FLUID   Final   Special Requests 20ML FLUID   Final   Gram Stain     Final   Value: WBC PRESENT, PREDOMINANTLY MONONUCLEAR     NO ORGANISMS SEEN     Performed at [facility]   Culture     Final   Value: NO GROWTH 3 DAYS      Performed at [facility]   Report Status 08/19/2013 FINAL   Final  Labs: Basic Metabolic Panel:  Recent Labs Lab 08/16/13 0349 08/18/13 1842 08/19/13 1920  NA 135* 132* 132*  K 2.7* 2.2* 2.7*  CL 97 92* 93*  CO2 20 19 18*  GLUCOSE 109* 130* 170*  BUN 15 16 16   CREATININE 4.42* 4.62* 4.52*  CALCIUM 7.2* 7.4* 7.5*  MG 1.4*  --  1.3*  PHOS 4.6 3.3  --    Liver Function Tests:  Recent Labs Lab 08/16/13 0349 08/18/13 1842  AST 24  --   ALT 13  --   ALKPHOS 82  --   BILITOT 0.3  --   PROT 4.8*  --   ALBUMIN 1.6* 1.2*   No results found for this basename: LIPASE, AMYLASE,  in the last 168 hours No results found for this basename: AMMONIA,  in the last 168 hours CBC:  Recent Labs Lab 08/16/13 0349 08/16/13 0914 08/17/13 0515 08/18/13 0446 08/19/13 0740 08/20/13 0357  WBC 16.1* 17.9* 23.0* 25.9* 25.3* 25.6*  NEUTROABS 12.6*  --   --   --   --   --   HGB 9.4* 10.2* 10.3* 10.4* 9.8* 9.9*  HCT 29.4* 32.3* 32.2* 32.8* 30.1* 31.0*  MCV 89.4 90.2 89.9 89.9 88.3 89.1  PLT 246 204 220 228 262 278   Cardiac Enzymes:  Recent Labs Lab 08/16/13 0349 08/16/13 0914 08/16/13 1405  TROPONINI <0.30 <0.30 <0.30   BNP: BNP (last 3 results) No results found for this basename: PROBNP,  in the last 8760 hours CBG:  Recent Labs Lab 08/18/13 1157 08/18/13 1702 08/18/13 2206 08/19/13 0747 08/19/13 1125  GLUCAP 132* 142* 136* 145* 157*    Time coordinating discharge: 45 minutes  Signed:  Shelly Shoultz  [facility] 08/20/2013, 1:48 PM

## 2013-08-20 NOTE — Progress Notes (Signed)
Renal Minimally arousable. BP low 50 systolic.  Realistically, not able to do PD. Little to offer. Kiam Bransfield C

## 2013-08-21 NOTE — Care Management Note (Signed)
CARE MANAGEMENT NOTE 08/21/2013  Patient:  Valerie Yoder, Valerie Yoder   Account Number:  [id number]  Date Initiated:  08/16/2013  Documentation initiated by:  AMERSON,JULIE  Subjective/Objective Assessment:   Pt adm on 08/15/13 with Lt LE DVT, recurrent peritonitis. PTA, pt resides at home with family.     Action/Plan:   Per daughter, pt has been evaluated by Hospice, but not picked up, as they do not want to stop peritoneal dialysis. Palliative Care to evaluate for goals of care.  Daughter desires to take pt home.   Anticipated DC Date:  08/20/2013   Anticipated DC Plan:  [place]  CM consult      Choice offered to / List presented to:          [facility] arranged  HH-1 RN  [place] agency  [place]   Status of service:  Completed, signed off Medicare Important Message given?  NO (If response is "NO", the following Medicare IM given date fields will be blank) Date Medicare IM given:   Medicare IM given by:   Date Additional Medicare IM given:   Additional Medicare IM given by:    Discharge Disposition:  [place]  Per UR Regulation:  Reviewed for med. necessity/level of care/duration of stay  If discussed at [place] of Stay Meetings, dates discussed:    Comments:  08/20/2013 Spoke with pt daughter in law, Valerie Yoder re [facility] needs, noted consult for [facility] however no orders. Orders for hospital bed and APP mattress placed and [facility] notified of plan to d/c to home asap. Will need HH orders , will contact MD as face to face must be completed. Will continue to follow. Spoke wtih [facility] and they do not go into pt home area to provide [employer]. Will setup [facility] with [facility] health once orders are written . Jasmine Pang RN  [facility], case manager, [phone]

## 2013-09-15 DEATH — deceased

## 2015-03-27 IMAGING — CT CT ABD-PELV W/ CM
2 of 5 series · 16 of 46 positions shown, 18 images · IV contrast (CONTRAST)
Comparison: 12/05/2011

CLINICAL DATA: Abdominal pain. Constipation and now diarrhea,
fever, rectal pain and bleeding.

EXAM:
CT ABDOMEN AND PELVIS WITH CONTRAST
TECHNIQUE: Multidetector CT imaging of the abdomen and pelvis was performed
using the standard protocol following bolus administration of
intravenous contrast.
CONTRAST:  100mL OMNIPAQUE IOHEXOL 300 MG/ML  SOLN

[Series 2: routine · axial · 0.68mm/px · z∈[+360,+736]mm · 13 of 83 slices shown, 15 images]
[im 4/83  soft-tissue]
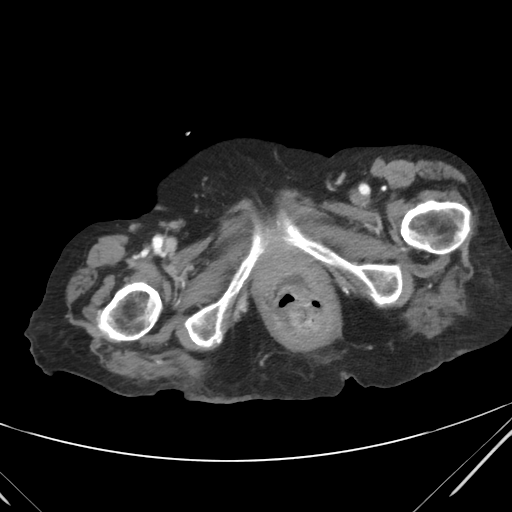
[im 4/83  bone]
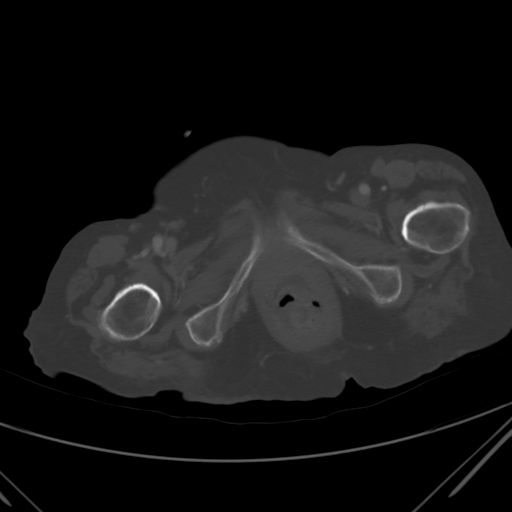
[im 12/83  soft-tissue]
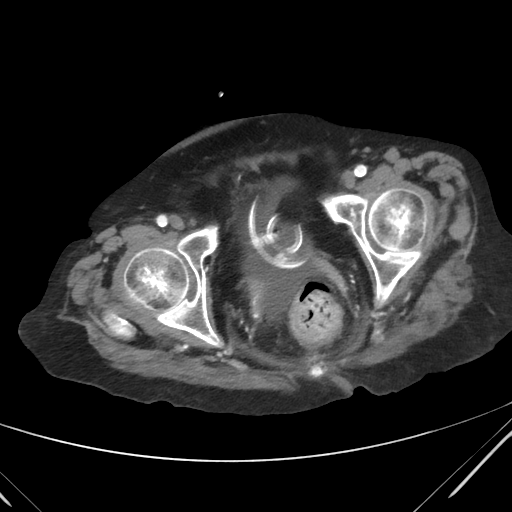
[im 16/83  soft-tissue]
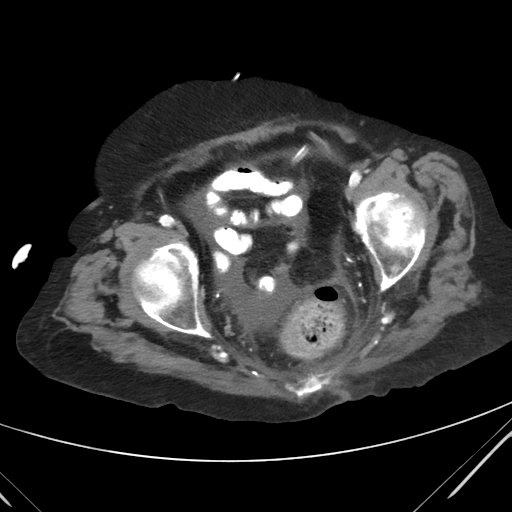
[im 24/83  soft-tissue]
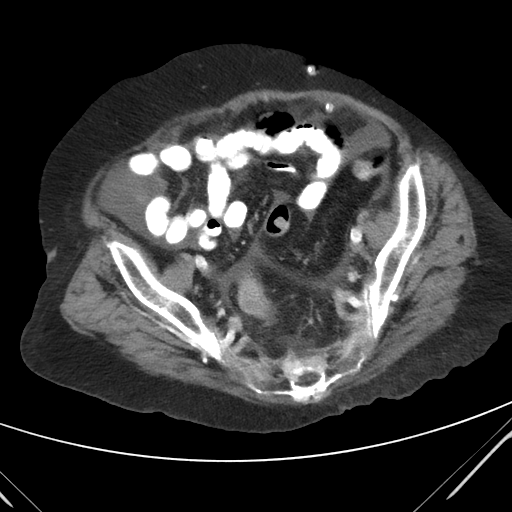
[im 28/83  soft-tissue]
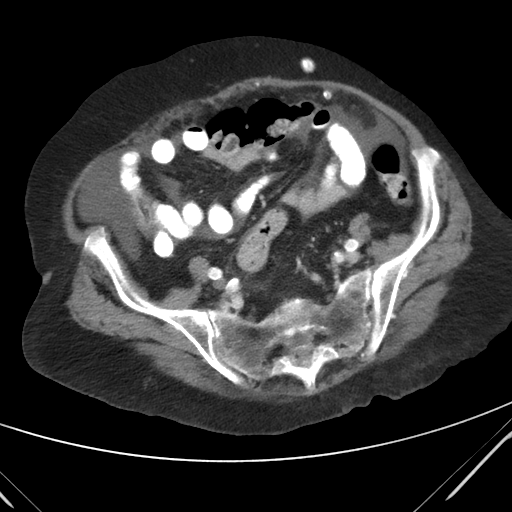
[im 36/83  soft-tissue]
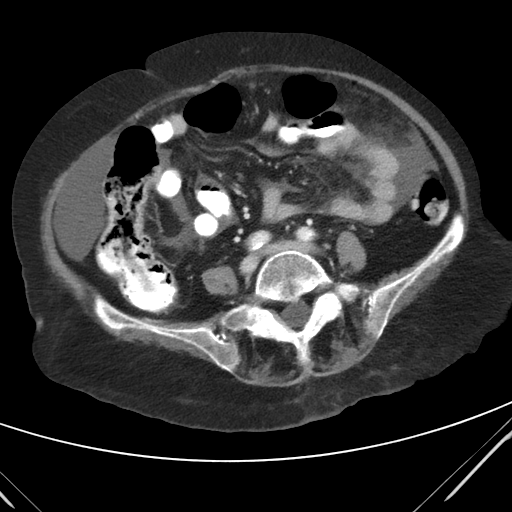
[im 43/83  soft-tissue]
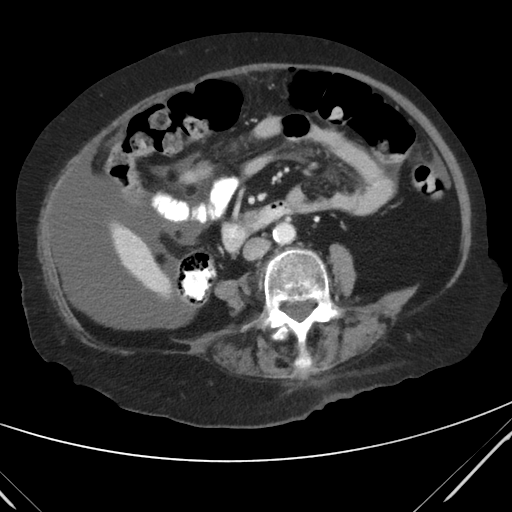
[im 47/83  soft-tissue]
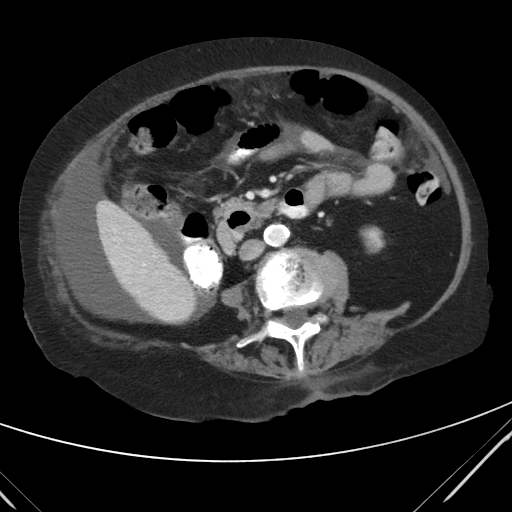
[im 55/83  soft-tissue]
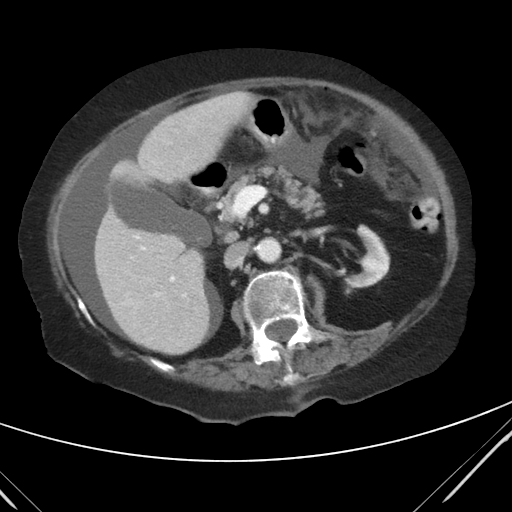
[im 55/83  bone]
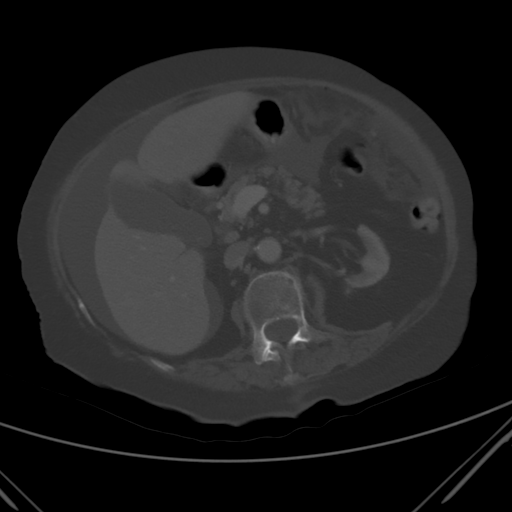
[im 59/83  soft-tissue]
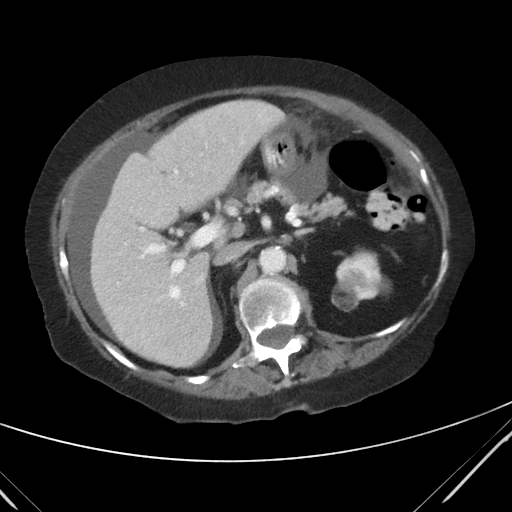
[im 67/83  soft-tissue]
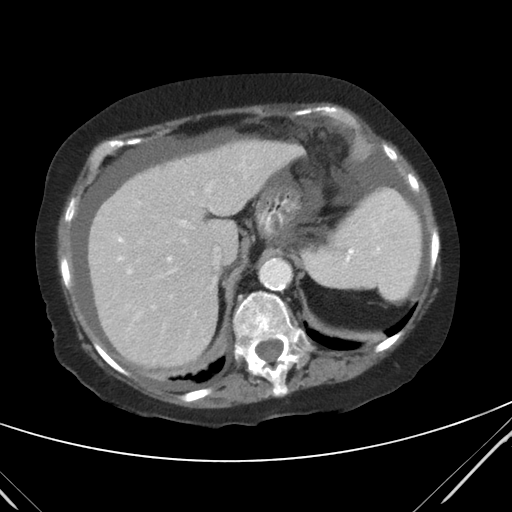
[im 71/83  soft-tissue]
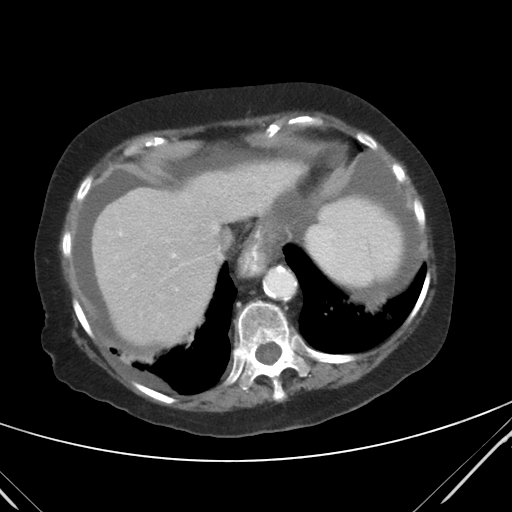
[im 79/83  soft-tissue]
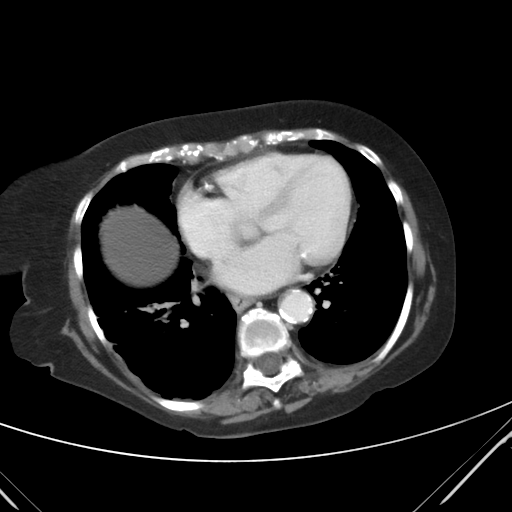

[coronal · coronal · 0.80mm/px · 3 of 123 slices shown]
[im 41/123  soft-tissue]
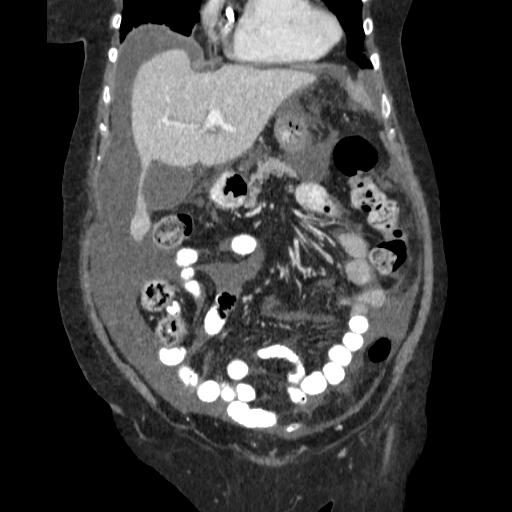
[im 55/123  soft-tissue]
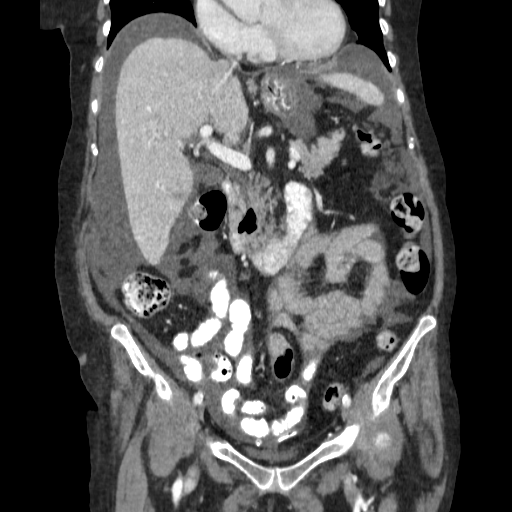
[im 68/123  soft-tissue]
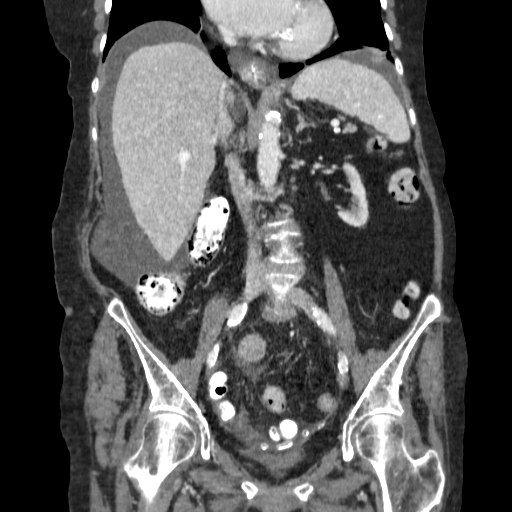

[16 of 46 positions shown; findings below may reference images not displayed]

FINDINGS: The lung bases demonstrate by lateral linear density likely
atelectasis with small amount of right pleural fluid. Coronary
artery calcifications are present. There is mild cardiomegaly.

Abdominal images demonstrate evidence of patient's peritoneal
dialysis catheter entering the left lower quadrant with tip over the
pelvis just left of midline without significant change. There is
mild ascites present likely due to patient's peritoneal dialysis
slightly more pronounced compared to the prior exam. The liver,
spleen, pancreas, gallbladder and adrenal glands are within normal.
There is absence of the right kidney. The left kidney demonstrates a
well-defined 1.9 cm homogeneously hypodense exophytic mass over the
upper pole with Hounsfield unit measurements of 24 unchanged in size
since 1191 and likely a slightly hyperdense cyst. There is continued
evidence of a 2.3 cm solid and fatty mass over the superior pole of
the left kidney as this is unchanged in size compared to 1191,
although demonstrates slightly less fat component. Appendix is not
definitely seen. There is moderate calcified atherosclerotic plaque
of the abdominal aorta iliac vessels.

There is no evidence of bowel obstruction. Air is present throughout
the colon. There are a few small flecks of free air over the
anterior aspect of the left abdomen just above the peritoneal
dialysis catheter entry point without change from the prior exams.

Pelvic images demonstrate suggestion mild wall thickening over the
distal rectal region. There is no change in mild compression
deformity of T12 as there is also a mild compression deformity of T9
age-indeterminate.
IMPRESSION: Mild wall thickening over the distal rectum which may be due to
proctitis.

Left-sided peritoneal dialysis catheter with tip over the pelvis
just left of midline without significant change. Ascites slightly
worse.

Mild bibasilar atelectatic change and small amount of right pleural
fluid.

Probable hyperdense cyst over the upper pole of the left kidney
unchanged from 1191. No change in size of a 2.3 cm mass over the
upper pole of the left kidney with mixed solid and fatty components,
although there is slightly less fat on the current exam which may be
due to mild increased internal hemorrhage of this presumed AML.
Recommend followup CT in 6 months.

Stable T12 compression fracture with mild T9 compression fracture
age indeterminate.

## 2015-11-06 IMAGING — CR DG ABDOMEN 1V
1 series · 1 of 1 positions shown · non-contrast
Comparison: CT, 01/04/2013

CLINICAL DATA: Nausea and vomiting.  Weakness.

EXAM:
ABDOMEN - 1 VIEW

[t abdomen supine]
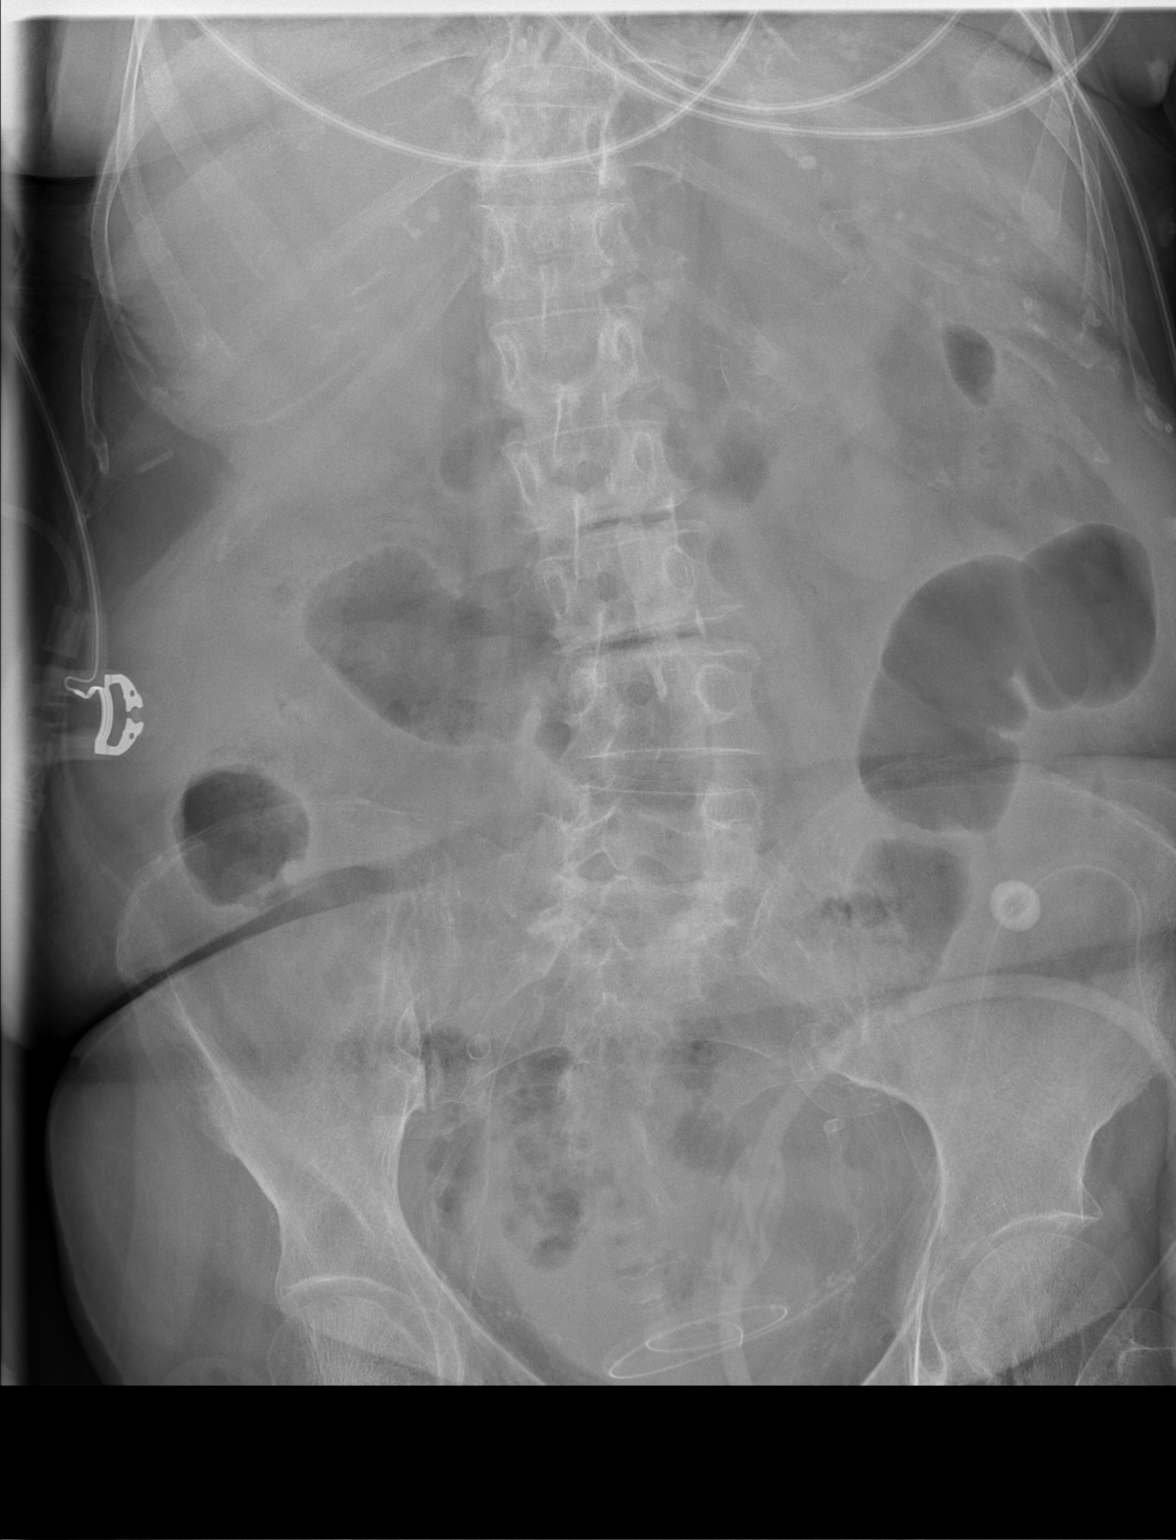

[1 of 1 positions shown; findings below may reference images not displayed]

FINDINGS: Bowel gas pattern is unremarkable with no evidence of obstruction or
generalized adynamic ileus.

Dialysis catheter curls in the low pelvis. There are aortic, splenic
and iliac artery vascular calcifications. Soft tissues are otherwise
unremarkable. Bony structures are demineralized with degenerative
changes noted along the visible spine.
IMPRESSION: No acute findings.  No obstruction.
